# Patient Record
Sex: Female | Born: 1955 | Race: Black or African American | Hispanic: No | Smoking: Never smoker
Health system: Southern US, Community
[De-identification: ages and names within clinical notes are randomized; demographics above are authoritative.]

## PROBLEM LIST (undated history)

## (undated) DIAGNOSIS — I10 Essential (primary) hypertension: Secondary | ICD-10-CM

## (undated) DIAGNOSIS — R7303 Prediabetes: Secondary | ICD-10-CM

## (undated) DIAGNOSIS — E785 Hyperlipidemia, unspecified: Secondary | ICD-10-CM

## (undated) DIAGNOSIS — E7800 Pure hypercholesterolemia, unspecified: Secondary | ICD-10-CM

## (undated) DIAGNOSIS — R911 Solitary pulmonary nodule: Secondary | ICD-10-CM

## (undated) DIAGNOSIS — M1990 Unspecified osteoarthritis, unspecified site: Secondary | ICD-10-CM

## (undated) HISTORY — DX: Hyperlipidemia, unspecified: E78.5

## (undated) HISTORY — PX: COLONOSCOPY: SHX5424

## (undated) HISTORY — PX: OTHER SURGICAL HISTORY: SHX169

## (undated) HISTORY — PX: TUBAL LIGATION: SHX77

## (undated) HISTORY — DX: Essential (primary) hypertension: I10

## (undated) HISTORY — PX: POLYPECTOMY: SHX149

## (undated) HISTORY — DX: Solitary pulmonary nodule: R91.1

---

## 1997-07-13 ENCOUNTER — Other Ambulatory Visit: Admission: RE | Admit: 1997-07-13 | Discharge: 1997-07-13 | Payer: Self-pay | Admitting: Gynecology

## 1998-08-17 ENCOUNTER — Other Ambulatory Visit: Admission: RE | Admit: 1998-08-17 | Discharge: 1998-08-17 | Payer: Self-pay | Admitting: Gynecology

## 1999-10-26 ENCOUNTER — Emergency Department (HOSPITAL_COMMUNITY): Admission: EM | Admit: 1999-10-26 | Discharge: 1999-10-27 | Payer: Self-pay | Admitting: Emergency Medicine

## 1999-10-27 ENCOUNTER — Encounter: Payer: Self-pay | Admitting: Emergency Medicine

## 2000-04-23 ENCOUNTER — Other Ambulatory Visit: Admission: RE | Admit: 2000-04-23 | Discharge: 2000-04-23 | Payer: Self-pay | Admitting: Gynecology

## 2001-11-25 ENCOUNTER — Other Ambulatory Visit: Admission: RE | Admit: 2001-11-25 | Discharge: 2001-11-25 | Payer: Self-pay | Admitting: Gynecology

## 2003-03-01 ENCOUNTER — Emergency Department (HOSPITAL_COMMUNITY): Admission: EM | Admit: 2003-03-01 | Discharge: 2003-03-01 | Payer: Self-pay | Admitting: Emergency Medicine

## 2005-06-08 ENCOUNTER — Emergency Department (HOSPITAL_COMMUNITY): Admission: EM | Admit: 2005-06-08 | Discharge: 2005-06-08 | Payer: Self-pay | Admitting: Emergency Medicine

## 2005-06-27 ENCOUNTER — Ambulatory Visit: Payer: Self-pay | Admitting: Internal Medicine

## 2006-03-03 DIAGNOSIS — R911 Solitary pulmonary nodule: Secondary | ICD-10-CM

## 2006-03-03 HISTORY — DX: Solitary pulmonary nodule: R91.1

## 2006-03-25 ENCOUNTER — Emergency Department (HOSPITAL_COMMUNITY): Admission: EM | Admit: 2006-03-25 | Discharge: 2006-03-25 | Payer: Self-pay | Admitting: Emergency Medicine

## 2006-03-26 ENCOUNTER — Ambulatory Visit: Payer: Self-pay | Admitting: Internal Medicine

## 2006-03-26 LAB — CONVERTED CEMR LAB
ALT: 16 units/L (ref 0–40)
BUN: 10 mg/dL (ref 6–23)
Basophils Absolute: 0 10*3/uL (ref 0.0–0.1)
Basophils Relative: 0.5 % (ref 0.0–1.0)
Bilirubin Urine: NEGATIVE
Calcium: 9.2 mg/dL (ref 8.4–10.5)
GFR calc Af Amer: 115 mL/min
GFR calc non Af Amer: 95 mL/min
Glucose, Bld: 95 mg/dL (ref 70–99)
HCT: 37.1 % (ref 36.0–46.0)
MCHC: 34.5 g/dL (ref 30.0–36.0)
Monocytes Relative: 7.5 % (ref 3.0–11.0)
RBC: 4.74 M/uL (ref 3.87–5.11)
RDW: 14.5 % (ref 11.5–14.6)
Specific Gravity, Urine: >=1.03 (ref 1.000–1.03)
pH: 5 (ref 5.0–8.0)

## 2006-04-19 ENCOUNTER — Ambulatory Visit: Payer: Self-pay | Admitting: Internal Medicine

## 2006-05-14 ENCOUNTER — Encounter: Admission: RE | Admit: 2006-05-14 | Discharge: 2006-08-12 | Payer: Self-pay | Admitting: Internal Medicine

## 2006-05-31 ENCOUNTER — Ambulatory Visit: Payer: Self-pay | Admitting: Internal Medicine

## 2006-05-31 LAB — CONVERTED CEMR LAB
Creatinine,U: 145.2 mg/dL
Direct LDL: 150 mg/dL
HDL: 41.8 mg/dL (ref >39.0)
VLDL: 27 mg/dL (ref 0–40)

## 2006-06-18 ENCOUNTER — Ambulatory Visit: Payer: Self-pay | Admitting: Internal Medicine

## 2006-11-12 ENCOUNTER — Telehealth (INDEPENDENT_AMBULATORY_CARE_PROVIDER_SITE_OTHER): Payer: Self-pay | Admitting: *Deleted

## 2007-01-03 LAB — HM PAP SMEAR: HM Pap smear: NORMAL

## 2007-01-03 LAB — HM MAMMOGRAPHY: HM Mammogram: NORMAL

## 2007-04-03 DIAGNOSIS — E118 Type 2 diabetes mellitus with unspecified complications: Secondary | ICD-10-CM

## 2007-04-03 DIAGNOSIS — J984 Other disorders of lung: Secondary | ICD-10-CM

## 2007-04-03 DIAGNOSIS — Z9189 Other specified personal risk factors, not elsewhere classified: Secondary | ICD-10-CM | POA: Insufficient documentation

## 2007-04-03 DIAGNOSIS — R030 Elevated blood-pressure reading, without diagnosis of hypertension: Secondary | ICD-10-CM

## 2007-04-04 ENCOUNTER — Ambulatory Visit: Payer: Self-pay | Admitting: Internal Medicine

## 2007-04-04 DIAGNOSIS — E785 Hyperlipidemia, unspecified: Secondary | ICD-10-CM

## 2007-04-04 LAB — CONVERTED CEMR LAB
Bilirubin, Direct: 0.1 mg/dL (ref 0.0–0.3)
CO2: 25 meq/L (ref 19–32)
Calcium: 9.2 mg/dL (ref 8.4–10.5)
GFR calc Af Amer: 98 mL/min
GFR calc non Af Amer: 81 mL/min
HDL: 39.4 mg/dL (ref >39.0)
Hgb A1c MFr Bld: 6.1 % — ABNORMAL HIGH (ref 4.6–6.0)
Microalb Creat Ratio: 2.7 mg/g (ref 0.0–30.0)
Microalb, Ur: 0.4 mg/dL (ref 0.0–1.9)
Sodium: 138 meq/L (ref 135–145)
TSH: 1.98 microintl units/mL (ref 0.35–5.50)
Total Bilirubin: 0.5 mg/dL (ref 0.3–1.2)
Total CHOL/HDL Ratio: 4.6
VLDL: 14 mg/dL (ref 0–40)

## 2007-04-05 ENCOUNTER — Encounter: Payer: Self-pay | Admitting: Internal Medicine

## 2007-04-09 ENCOUNTER — Ambulatory Visit: Payer: Self-pay | Admitting: Cardiovascular Disease

## 2007-06-17 ENCOUNTER — Telehealth: Payer: Self-pay | Admitting: Internal Medicine

## 2007-06-25 ENCOUNTER — Telehealth: Payer: Self-pay | Admitting: Internal Medicine

## 2007-10-09 ENCOUNTER — Ambulatory Visit: Payer: Self-pay | Admitting: Internal Medicine

## 2007-10-09 DIAGNOSIS — R51 Headache: Secondary | ICD-10-CM

## 2007-10-09 DIAGNOSIS — K5900 Constipation, unspecified: Secondary | ICD-10-CM | POA: Insufficient documentation

## 2007-10-09 DIAGNOSIS — R519 Headache, unspecified: Secondary | ICD-10-CM | POA: Insufficient documentation

## 2007-10-09 DIAGNOSIS — IMO0001 Reserved for inherently not codable concepts without codable children: Secondary | ICD-10-CM

## 2007-10-09 LAB — CONVERTED CEMR LAB
ALT: 23 units/L (ref 0–35)
AST: 24 units/L (ref 0–37)
Albumin: 3.6 g/dL (ref 3.5–5.2)
BUN: 11 mg/dL (ref 6–23)
Basophils Absolute: 0 10*3/uL (ref 0.0–0.1)
Basophils Relative: 0.4 % (ref 0.0–3.0)
Bilirubin Urine: NEGATIVE
CO2: 27 meq/L (ref 19–32)
Calcium: 9.1 mg/dL (ref 8.4–10.5)
Cholesterol: 260 mg/dL (ref 0–200)
Creatinine, Ser: 0.8 mg/dL (ref 0.4–1.2)
Direct LDL: 181.8 mg/dL
Glucose, Bld: 102 mg/dL — ABNORMAL HIGH (ref 70–99)
HCT: 38.4 % (ref 36.0–46.0)
Hemoglobin, Urine: NEGATIVE
Hemoglobin: 12.6 g/dL (ref 12.0–15.0)
Hgb A1c MFr Bld: 6.6 % — ABNORMAL HIGH (ref 4.6–6.0)
Ketones, ur: NEGATIVE mg/dL
MCHC: 32.7 g/dL (ref 30.0–36.0)
Monocytes Absolute: 0.5 10*3/uL (ref 0.1–1.0)
Neutro Abs: 4.8 10*3/uL (ref 1.4–7.7)
Nitrite: NEGATIVE
RDW: 15.7 % — ABNORMAL HIGH (ref 11.5–14.6)
Total Bilirubin: 0.6 mg/dL (ref 0.3–1.2)
Total Protein, Urine: NEGATIVE mg/dL
Total Protein: 7.4 g/dL (ref 6.0–8.3)

## 2007-10-15 ENCOUNTER — Encounter: Payer: Self-pay | Admitting: Internal Medicine

## 2007-10-23 ENCOUNTER — Ambulatory Visit: Payer: Self-pay | Admitting: Internal Medicine

## 2007-10-23 DIAGNOSIS — I10 Essential (primary) hypertension: Secondary | ICD-10-CM | POA: Insufficient documentation

## 2007-10-25 ENCOUNTER — Telehealth: Payer: Self-pay | Admitting: Internal Medicine

## 2008-12-24 ENCOUNTER — Emergency Department (HOSPITAL_COMMUNITY): Admission: EM | Admit: 2008-12-24 | Discharge: 2008-12-24 | Payer: Self-pay | Admitting: Emergency Medicine

## 2010-04-04 LAB — URINALYSIS, ROUTINE W REFLEX MICROSCOPIC
Bilirubin Urine: NEGATIVE
Glucose, UA: NEGATIVE mg/dL
Hgb urine dipstick: NEGATIVE
Ketones, ur: 15 mg/dL — AB
Protein, ur: NEGATIVE mg/dL
pH: 6.5 (ref 5.0–8.0)
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
## 2010-10-06 LAB — HM DIABETES EYE EXAM

## 2010-11-07 ENCOUNTER — Ambulatory Visit: Payer: Self-pay | Admitting: Internal Medicine

## 2010-11-09 ENCOUNTER — Ambulatory Visit (INDEPENDENT_AMBULATORY_CARE_PROVIDER_SITE_OTHER)
Admission: RE | Admit: 2010-11-09 | Discharge: 2010-11-09 | Disposition: A | Discharge status: Home / Self Care | Payer: Managed Care, Other (non HMO) | Source: Ambulatory Visit | Attending: Internal Medicine | Admitting: Internal Medicine

## 2010-11-09 ENCOUNTER — Other Ambulatory Visit: Payer: Self-pay | Admitting: Internal Medicine

## 2010-11-09 ENCOUNTER — Encounter: Payer: Self-pay | Admitting: Internal Medicine

## 2010-11-09 ENCOUNTER — Ambulatory Visit (INDEPENDENT_AMBULATORY_CARE_PROVIDER_SITE_OTHER): Payer: Managed Care, Other (non HMO) | Admitting: Internal Medicine

## 2010-11-09 ENCOUNTER — Other Ambulatory Visit (INDEPENDENT_AMBULATORY_CARE_PROVIDER_SITE_OTHER): Payer: Managed Care, Other (non HMO)

## 2010-11-09 DIAGNOSIS — E119 Type 2 diabetes mellitus without complications: Secondary | ICD-10-CM

## 2010-11-09 DIAGNOSIS — N920 Excessive and frequent menstruation with regular cycle: Secondary | ICD-10-CM

## 2010-11-09 DIAGNOSIS — E785 Hyperlipidemia, unspecified: Secondary | ICD-10-CM

## 2010-11-09 DIAGNOSIS — I10 Essential (primary) hypertension: Secondary | ICD-10-CM

## 2010-11-09 DIAGNOSIS — Z1231 Encounter for screening mammogram for malignant neoplasm of breast: Secondary | ICD-10-CM

## 2010-11-09 DIAGNOSIS — N921 Excessive and frequent menstruation with irregular cycle: Secondary | ICD-10-CM | POA: Insufficient documentation

## 2010-11-09 DIAGNOSIS — Z0000 Encounter for general adult medical examination without abnormal findings: Secondary | ICD-10-CM

## 2010-11-09 DIAGNOSIS — J984 Other disorders of lung: Secondary | ICD-10-CM

## 2010-11-09 LAB — URINALYSIS, ROUTINE W REFLEX MICROSCOPIC
Leukocytes, UA: NEGATIVE
Nitrite: NEGATIVE
Specific Gravity, Urine: >=1.03 (ref 1.000–1.030)
Urobilinogen, UA: 0.2 (ref 0.0–1.0)
pH: 5.5 (ref 5.0–8.0)

## 2010-11-09 LAB — CBC WITH DIFFERENTIAL/PLATELET
Basophils Relative: 0.2 % (ref 0.0–3.0)
Eosinophils Absolute: 0.1 10*3/uL (ref 0.0–0.7)
Eosinophils Relative: 1.9 % (ref 0.0–5.0)
HCT: 37.5 % (ref 36.0–46.0)
Lymphs Abs: 1.7 10*3/uL (ref 0.7–4.0)
MCHC: 32.7 g/dL (ref 30.0–36.0)
MCV: 82.1 fl (ref 78.0–100.0)
Monocytes Absolute: 0.6 10*3/uL (ref 0.1–1.0)
Neutrophils Relative %: 61.2 % (ref 43.0–77.0)
Platelets: 181 10*3/uL (ref 150.0–400.0)
WBC: 6.2 10*3/uL (ref 4.5–10.5)

## 2010-11-09 LAB — COMPREHENSIVE METABOLIC PANEL
Alkaline Phosphatase: 48 U/L (ref 39–117)
CO2: 24 mEq/L (ref 19–32)
Creatinine, Ser: 0.6 mg/dL (ref 0.4–1.2)
GFR: 129.2 mL/min (ref >60.00)
Glucose, Bld: 88 mg/dL (ref 70–99)
Sodium: 138 mEq/L (ref 135–145)
Total Bilirubin: 0.7 mg/dL (ref 0.3–1.2)

## 2010-11-09 LAB — MICROALBUMIN / CREATININE URINE RATIO: Creatinine,U: 178.2 mg/dL

## 2010-11-09 LAB — LIPID PANEL
HDL: 49.9 mg/dL (ref >39.00)
Triglycerides: 86 mg/dL (ref 0.0–149.0)

## 2010-11-09 LAB — TSH: TSH: 2 u[IU]/mL (ref 0.35–5.50)

## 2010-11-09 NOTE — Assessment & Plan Note (Signed)
I will check her FLP today 

## 2010-11-09 NOTE — Assessment & Plan Note (Signed)
Her BP is well controlled, today I will check her labs to look for and organ damage

## 2010-11-09 NOTE — Patient Instructions (Signed)

## 2010-11-09 NOTE — Assessment & Plan Note (Signed)
She needs a f/up CXR

## 2010-11-09 NOTE — Assessment & Plan Note (Signed)
I will check her a1c today and will monitor her renal function 

## 2010-11-09 NOTE — Assessment & Plan Note (Signed)
I have asked her to see a GYN

## 2010-11-09 NOTE — Progress Notes (Signed)
Subjective:    Patient ID: [name], female    DOB: Apr 28, 1955, 55 y.o.   MRN: [id number]  Diabetes She presents for her follow-up diabetic visit. She has type 2 diabetes mellitus. Her disease course has been stable. There are no hypoglycemic associated symptoms. Pertinent negatives for hypoglycemia include no dizziness, headaches, pallor, seizures, speech difficulty or tremors. Pertinent negatives for diabetes include no blurred vision, no chest pain, no fatigue, no foot paresthesias, no foot ulcerations, no polydipsia, no polyphagia, no polyuria, no visual change, no weakness and no weight loss. There are no hypoglycemic complications. Symptoms are stable. There are no diabetic complications. When asked about current treatments, none were reported. She is compliant with treatment none of the time. Her weight is increasing steadily. She is following a generally healthy diet. Meal planning includes avoidance of concentrated sweets. She has not had a previous visit with a dietician. She never participates in exercise. There is no change in her home blood glucose trend. An ACE inhibitor/angiotensin II receptor blocker is not being taken. She does not see a podiatrist.Eye exam is current.      Review of Systems  Constitutional: Negative for chills, weight loss, diaphoresis, activity change, appetite change, fatigue and unexpected weight change.  HENT: Negative.   Eyes: Negative.  Negative for blurred vision.  Respiratory: Negative for apnea, cough, choking, chest tightness, shortness of breath, wheezing and stridor.   Cardiovascular: Negative for chest pain, palpitations and leg swelling.  Gastrointestinal: Negative for nausea, vomiting, abdominal pain, diarrhea, constipation and rectal pain.  Genitourinary: Positive for vaginal bleeding and menstrual problem (long, heavy periods). Negative for dysuria, urgency, polyuria, frequency, hematuria, flank pain, decreased urine volume, vaginal discharge,  enuresis, difficulty urinating, vaginal pain, pelvic pain and dyspareunia.  Musculoskeletal: Negative.  Negative for myalgias, back pain, joint swelling, arthralgias and gait problem.  Skin: Negative for color change, pallor, rash and wound.  Neurological: Negative for dizziness, tremors, seizures, syncope, facial asymmetry, speech difficulty, weakness, light-headedness, numbness and headaches.  Hematological: Negative for polydipsia, polyphagia and adenopathy. Does not bruise/bleed easily.  Psychiatric/Behavioral: Negative.        Objective:   Physical Exam  Vitals reviewed. Constitutional: She is oriented to person, place, and time. She appears well-developed and well-nourished. No distress.  HENT:  Head: Normocephalic and atraumatic.  Mouth/Throat: Oropharynx is clear and moist. No oropharyngeal exudate.  Eyes: Conjunctivae are normal. Right eye exhibits no discharge. Left eye exhibits no discharge. No scleral icterus.  Neck: Normal range of motion. Neck supple. No JVD present. No tracheal deviation present. No thyromegaly present.  Cardiovascular: Normal rate, regular rhythm, normal heart sounds and intact distal pulses.  Exam reveals no gallop and no friction rub.   No murmur heard. Pulmonary/Chest: Effort normal and breath sounds normal. No stridor. No respiratory distress. She has no wheezes. She has no rales. She exhibits no tenderness.  Abdominal: Soft. Bowel sounds are normal. She exhibits no distension and no mass. There is no tenderness. There is no rebound and no guarding.  Musculoskeletal: Normal range of motion. She exhibits no edema and no tenderness.  Lymphadenopathy:    She has no cervical adenopathy.  Neurological: She is oriented to person, place, and time. She displays normal reflexes. She exhibits normal muscle tone.  Skin: Skin is warm and dry. No rash noted. She is not diaphoretic. No erythema. No pallor.  Psychiatric: She has a normal mood and affect. Her behavior  is normal. Judgment and thought content normal.  Lab Results  Component Value Date   WBC 7.4 10/09/2007   HGB 12.6 10/09/2007   HCT 38.4 10/09/2007   PLT 230 10/09/2007   GLUCOSE 102* 10/09/2007   CHOL 260* 10/09/2007   TRIG 119 10/09/2007   HDL 39.9 10/09/2007   LDLDIRECT 181.8 10/09/2007   LDLCALC 130* 04/04/2007   ALT 23 10/09/2007   AST 24 10/09/2007   NA 142 10/09/2007   K 4.0 10/09/2007   CL 109 10/09/2007   CREATININE 0.8 10/09/2007   BUN 11 10/09/2007   CO2 27 10/09/2007   TSH 1.62 10/09/2007   HGBA1C 6.6* 10/09/2007   MICROALBUR 0.4 04/04/2007      Assessment & Plan:

## 2011-01-16 ENCOUNTER — Encounter: Payer: Self-pay | Admitting: Gastroenterology

## 2011-01-24 LAB — HM PAP SMEAR: HM Pap smear: NORMAL

## 2011-03-20 ENCOUNTER — Encounter: Payer: Self-pay | Admitting: Internal Medicine

## 2011-03-20 ENCOUNTER — Ambulatory Visit (INDEPENDENT_AMBULATORY_CARE_PROVIDER_SITE_OTHER): Payer: Managed Care, Other (non HMO) | Admitting: Internal Medicine

## 2011-03-20 ENCOUNTER — Other Ambulatory Visit (INDEPENDENT_AMBULATORY_CARE_PROVIDER_SITE_OTHER): Payer: Managed Care, Other (non HMO)

## 2011-03-20 VITALS — BP 134/78 | HR 70 | Temp 98.2°F | Resp 16 | Wt 222.0 lb

## 2011-03-20 DIAGNOSIS — Z136 Encounter for screening for cardiovascular disorders: Secondary | ICD-10-CM

## 2011-03-20 DIAGNOSIS — R5383 Other fatigue: Secondary | ICD-10-CM

## 2011-03-20 DIAGNOSIS — I10 Essential (primary) hypertension: Secondary | ICD-10-CM

## 2011-03-20 DIAGNOSIS — R5381 Other malaise: Secondary | ICD-10-CM

## 2011-03-20 DIAGNOSIS — E119 Type 2 diabetes mellitus without complications: Secondary | ICD-10-CM

## 2011-03-20 DIAGNOSIS — E785 Hyperlipidemia, unspecified: Secondary | ICD-10-CM

## 2011-03-20 LAB — COMPREHENSIVE METABOLIC PANEL
ALT: 15 U/L (ref 0–35)
AST: 16 U/L (ref 0–37)
Albumin: 3.6 g/dL (ref 3.5–5.2)
Alkaline Phosphatase: 42 U/L (ref 39–117)
Calcium: 8.9 mg/dL (ref 8.4–10.5)
Creatinine, Ser: 0.7 mg/dL (ref 0.4–1.2)
Sodium: 137 mEq/L (ref 135–145)

## 2011-03-20 LAB — LIPID PANEL
Cholesterol: 199 mg/dL (ref 0–200)
LDL Cholesterol: 140 mg/dL — ABNORMAL HIGH (ref 0–99)
Total CHOL/HDL Ratio: 5
VLDL: 15.6 mg/dL (ref 0.0–40.0)

## 2011-03-20 LAB — URINALYSIS, ROUTINE W REFLEX MICROSCOPIC
Bilirubin Urine: NEGATIVE
Hgb urine dipstick: NEGATIVE
Leukocytes, UA: NEGATIVE
Nitrite: NEGATIVE
Total Protein, Urine: NEGATIVE

## 2011-03-20 LAB — CBC WITH DIFFERENTIAL/PLATELET
Basophils Relative: 0.3 % (ref 0.0–3.0)
Eosinophils Absolute: 0.1 10*3/uL (ref 0.0–0.7)
Eosinophils Relative: 1.5 % (ref 0.0–5.0)
Hemoglobin: 11.9 g/dL — ABNORMAL LOW (ref 12.0–15.0)
Lymphocytes Relative: 27.2 % (ref 12.0–46.0)
MCHC: 32.8 g/dL (ref 30.0–36.0)
MCV: 81.2 fl (ref 78.0–100.0)
Neutro Abs: 4.6 10*3/uL (ref 1.4–7.7)
Neutrophils Relative %: 64.2 % (ref 43.0–77.0)
RBC: 4.48 Mil/uL (ref 3.87–5.11)
WBC: 7.2 10*3/uL (ref 4.5–10.5)

## 2011-03-20 LAB — HM DIABETES FOOT EXAM: HM Diabetic Foot Exam: NORMAL

## 2011-03-20 LAB — IBC PANEL: Transferrin: 280.6 mg/dL (ref 212.0–360.0)

## 2011-03-20 LAB — TSH: TSH: 2.34 u[IU]/mL (ref 0.35–5.50)

## 2011-03-20 MED ORDER — OLMESARTAN MEDOXOMIL 40 MG PO TABS: 40.0000 mg | ORAL_TABLET | Freq: Every day | ORAL | Status: DC

## 2011-03-20 MED ORDER — PITAVASTATIN CALCIUM 4 MG PO TABS: 1.0000 | ORAL_TABLET | Freq: Every day | ORAL | Status: DC

## 2011-03-20 NOTE — Assessment & Plan Note (Signed)
Her BP is well controlled, I will check her lytes and renal function today 

## 2011-03-20 NOTE — Patient Instructions (Signed)

## 2011-03-20 NOTE — Assessment & Plan Note (Signed)
She needs to start a statin so I asked her to start livalo

## 2011-03-20 NOTE — Assessment & Plan Note (Signed)
I will check her a1c today and will monitor her renal function 

## 2011-03-20 NOTE — Progress Notes (Signed)
Subjective:    Patient ID: [name], female    DOB: 27-Oct-1955, 56 y.o.   MRN: [id number]  Diabetes She presents for her follow-up diabetic visit. She has type 2 diabetes mellitus. Her disease course has been stable. Hypoglycemia symptoms include dizziness. Pertinent negatives for hypoglycemia include no headaches, pallor, seizures, speech difficulty or tremors. Associated symptoms include blurred vision and fatigue. Pertinent negatives for diabetes include no chest pain, no foot paresthesias, no foot ulcerations, no polydipsia, no polyphagia, no polyuria, no visual change, no weakness and no weight loss. There are no hypoglycemic complications. Symptoms are stable. There are no diabetic complications. When asked about current treatments, none were reported. Her weight is stable. She is following a generally unhealthy diet. When asked about meal planning, she reported none. She has not had a previous visit with a dietician. She participates in exercise intermittently. There is no change in her home blood glucose trend. Her breakfast blood glucose range is generally 90-110 mg/dl. Her lunch blood glucose range is generally 130-140 mg/dl. Her dinner blood glucose range is generally 130-140 mg/dl. Her highest blood glucose is 130-140 mg/dl. Her overall blood glucose range is 130-140 mg/dl. An ACE inhibitor/angiotensin II receptor blocker is not being taken. She does not see a podiatrist.Eye exam is current.      Review of Systems  Constitutional: Positive for fatigue. Negative for fever, chills, weight loss, diaphoresis, activity change, appetite change and unexpected weight change.  HENT: Negative.   Eyes: Positive for blurred vision.  Respiratory: Negative for apnea, cough, choking, chest tightness, shortness of breath, wheezing and stridor.   Cardiovascular: Negative for chest pain, palpitations and leg swelling.  Gastrointestinal: Negative for nausea, vomiting, abdominal pain, diarrhea,  constipation, blood in stool, abdominal distention, anal bleeding and rectal pain.  Genitourinary: Negative for dysuria, urgency, polyuria, frequency, hematuria, flank pain, decreased urine volume, vaginal bleeding, vaginal discharge, enuresis, difficulty urinating, genital sores, vaginal pain, menstrual problem, pelvic pain and dyspareunia.  Musculoskeletal: Negative for myalgias, back pain, joint swelling, arthralgias and gait problem.  Skin: Negative for color change, pallor, rash and wound.  Neurological: Positive for dizziness. Negative for tremors, seizures, syncope, facial asymmetry, speech difficulty, weakness, light-headedness, numbness and headaches.  Hematological: Negative for polydipsia, polyphagia and adenopathy. Does not bruise/bleed easily.  Psychiatric/Behavioral: Negative.        Objective:   Physical Exam  Vitals reviewed. Constitutional: She is oriented to person, place, and time. She appears well-developed and well-nourished. No distress.  HENT:  Head: Normocephalic and atraumatic.  Mouth/Throat: Oropharynx is clear and moist. No oropharyngeal exudate.  Eyes: Conjunctivae are normal. Right eye exhibits no discharge. Left eye exhibits no discharge. No scleral icterus.  Neck: Normal range of motion. Neck supple. No JVD present. No tracheal deviation present. No thyromegaly present.  Cardiovascular: Normal rate, regular rhythm, normal heart sounds and intact distal pulses.  Exam reveals no gallop.   No murmur heard. Pulmonary/Chest: Effort normal and breath sounds normal. No stridor. No respiratory distress. She has no wheezes. She has no rales. She exhibits no tenderness.  Abdominal: Soft. Bowel sounds are normal. She exhibits no distension and no mass. There is no tenderness. There is no rebound and no guarding.  Musculoskeletal: Normal range of motion. She exhibits no edema and no tenderness.  Lymphadenopathy:    She has no cervical adenopathy.  Neurological: She is  oriented to person, place, and time.  Skin: Skin is warm and dry. No rash noted. She is not diaphoretic. No erythema.  No pallor.  Psychiatric: She has a normal mood and affect. Her behavior is normal. Judgment and thought content normal.      Lab Results  Component Value Date   WBC 6.2 11/09/2010   HGB 12.3 11/09/2010   HCT 37.5 11/09/2010   PLT 181.0 11/09/2010   GLUCOSE 88 11/09/2010   CHOL 228* 11/09/2010   TRIG 86.0 11/09/2010   HDL 49.90 11/09/2010   LDLDIRECT 167.9 11/09/2010   LDLCALC 130* 04/04/2007   ALT 16 11/09/2010   AST 20 11/09/2010   NA 138 11/09/2010   K 3.8 11/09/2010   CL 107 11/09/2010   CREATININE 0.6 11/09/2010   BUN 16 11/09/2010   CO2 24 11/09/2010   TSH 2.00 11/09/2010   HGBA1C 6.1 11/09/2010   MICROALBUR 1.5 11/09/2010      Assessment & Plan:

## 2011-03-20 NOTE — Assessment & Plan Note (Signed)
Her EKG is normal today, I will check her labs to look for other causes of fatigue

## 2011-03-21 ENCOUNTER — Encounter: Payer: Self-pay | Admitting: Internal Medicine

## 2011-04-10 ENCOUNTER — Ambulatory Visit: Payer: Managed Care, Other (non HMO) | Admitting: Internal Medicine

## 2011-04-10 DIAGNOSIS — Z0289 Encounter for other administrative examinations: Secondary | ICD-10-CM

## 2012-01-03 HISTORY — PX: COLONOSCOPY: SHX174

## 2012-03-11 ENCOUNTER — Encounter: Payer: Self-pay | Admitting: Internal Medicine

## 2012-03-11 ENCOUNTER — Ambulatory Visit (INDEPENDENT_AMBULATORY_CARE_PROVIDER_SITE_OTHER): Payer: Managed Care, Other (non HMO) | Admitting: Internal Medicine

## 2012-03-11 ENCOUNTER — Other Ambulatory Visit (INDEPENDENT_AMBULATORY_CARE_PROVIDER_SITE_OTHER): Payer: Managed Care, Other (non HMO)

## 2012-03-11 VITALS — BP 140/82 | HR 84 | Temp 97.7°F | Resp 16 | Wt 217.2 lb

## 2012-03-11 DIAGNOSIS — E785 Hyperlipidemia, unspecified: Secondary | ICD-10-CM

## 2012-03-11 DIAGNOSIS — K219 Gastro-esophageal reflux disease without esophagitis: Secondary | ICD-10-CM

## 2012-03-11 DIAGNOSIS — R1314 Dysphagia, pharyngoesophageal phase: Secondary | ICD-10-CM

## 2012-03-11 DIAGNOSIS — J984 Other disorders of lung: Secondary | ICD-10-CM

## 2012-03-11 DIAGNOSIS — E119 Type 2 diabetes mellitus without complications: Secondary | ICD-10-CM

## 2012-03-11 DIAGNOSIS — I10 Essential (primary) hypertension: Secondary | ICD-10-CM

## 2012-03-11 DIAGNOSIS — Z1231 Encounter for screening mammogram for malignant neoplasm of breast: Secondary | ICD-10-CM

## 2012-03-11 LAB — CBC WITH DIFFERENTIAL/PLATELET
Basophils Absolute: 0 10*3/uL (ref 0.0–0.1)
HCT: 34.5 % — ABNORMAL LOW (ref 36.0–46.0)
Lymphs Abs: 2 10*3/uL (ref 0.7–4.0)
MCHC: 32 g/dL (ref 30.0–36.0)
MCV: 78.4 fl (ref 78.0–100.0)
Monocytes Absolute: 0.5 10*3/uL (ref 0.1–1.0)
Neutro Abs: 3.6 10*3/uL (ref 1.4–7.7)
Platelets: 179 10*3/uL (ref 150.0–400.0)
RDW: 15.9 % — ABNORMAL HIGH (ref 11.5–14.6)

## 2012-03-11 LAB — COMPREHENSIVE METABOLIC PANEL
ALT: 16 U/L (ref 0–35)
AST: 18 U/L (ref 0–37)
Alkaline Phosphatase: 41 U/L (ref 39–117)
CO2: 24 mEq/L (ref 19–32)
Sodium: 139 mEq/L (ref 135–145)
Total Bilirubin: 0.4 mg/dL (ref 0.3–1.2)
Total Protein: 7.1 g/dL (ref 6.0–8.3)

## 2012-03-11 LAB — LIPID PANEL
LDL Cholesterol: 135 mg/dL — ABNORMAL HIGH (ref 0–99)
Total CHOL/HDL Ratio: 5
VLDL: 21 mg/dL (ref 0.0–40.0)

## 2012-03-11 LAB — HEMOGLOBIN A1C: Hgb A1c MFr Bld: 6.1 % (ref 4.6–6.5)

## 2012-03-11 LAB — HM PAP SMEAR: HM Pap smear: NORMAL

## 2012-03-11 MED ORDER — RANITIDINE HCL 300 MG PO TABS: 300.0000 mg | ORAL_TABLET | Freq: Every day | ORAL | Status: DC

## 2012-03-11 NOTE — Patient Instructions (Signed)

## 2012-03-11 NOTE — Assessment & Plan Note (Signed)
I will recheck her FLP today 

## 2012-03-11 NOTE — Assessment & Plan Note (Signed)
She may benefit from an EGD ------> GI referral

## 2012-03-11 NOTE — Assessment & Plan Note (Signed)
Start zantac.  

## 2012-03-11 NOTE — Assessment & Plan Note (Signed)
She has adequate BP control 

## 2012-03-11 NOTE — Progress Notes (Signed)
Subjective:    Patient ID: [name], female    DOB: 1955/09/04, 57 y.o.   MRN: [id number]  Gastrophageal Reflux She complains of dysphagia, globus sensation and heartburn. She reports no abdominal pain, no belching, no chest pain, no choking, no coughing, no early satiety, no hoarse voice, no nausea, no sore throat, no stridor, no tooth decay, no water brash or no wheezing. This is a recurrent problem. The current episode started more than 1 month ago. The problem occurs occasionally. The problem has been unchanged. The heartburn duration is several minutes. The heartburn is located in the substernum. The heartburn is of mild intensity. The heartburn does not wake her from sleep. The heartburn does not limit her activity. The symptoms are aggravated by certain foods. Pertinent negatives include no anemia, fatigue, melena, muscle weakness, orthopnea or weight loss. Risk factors include lack of exercise. She has tried nothing for the symptoms. The treatment provided no relief.      Review of Systems  Constitutional: Negative for fever, chills, weight loss, diaphoresis, activity change, appetite change, fatigue and unexpected weight change.  HENT: Negative.  Negative for sore throat and hoarse voice.   Eyes: Negative.   Respiratory: Negative for cough, choking, chest tightness, shortness of breath, wheezing and stridor.   Cardiovascular: Negative for chest pain, palpitations and leg swelling.  Gastrointestinal: Positive for heartburn, dysphagia and constipation. Negative for nausea, vomiting, abdominal pain, diarrhea, blood in stool, melena, abdominal distention, anal bleeding and rectal pain.  Endocrine: Negative.   Genitourinary: Negative.   Musculoskeletal: Negative for myalgias, back pain, joint swelling, arthralgias, gait problem and muscle weakness.  Skin: Negative for color change, pallor, rash and wound.  Allergic/Immunologic: Negative.   Neurological: Negative for dizziness,  seizures, speech difficulty, weakness, light-headedness and headaches.  Hematological: Negative for adenopathy. Does not bruise/bleed easily.  Psychiatric/Behavioral: Negative.        Objective:   Physical Exam  Vitals reviewed. Constitutional: She is oriented to person, place, and time. She appears well-developed and well-nourished. No distress.  HENT:  Head: Normocephalic and atraumatic.  Mouth/Throat: Oropharynx is clear and moist. No oropharyngeal exudate.  Eyes: Conjunctivae are normal. Right eye exhibits no discharge. Left eye exhibits no discharge. No scleral icterus.  Neck: Normal range of motion. Neck supple. No JVD present. No tracheal deviation present. No thyromegaly present.  Cardiovascular: Normal rate, regular rhythm and intact distal pulses.  Exam reveals no gallop and no friction rub.   No murmur heard. Pulmonary/Chest: Effort normal and breath sounds normal. No stridor. No respiratory distress. She has no wheezes. She has no rales. She exhibits no tenderness.  Abdominal: Soft. Bowel sounds are normal. She exhibits no distension and no mass. There is no tenderness. There is no rebound and no guarding.  Musculoskeletal: Normal range of motion. She exhibits no edema and no tenderness.  Lymphadenopathy:    She has no cervical adenopathy.  Neurological: She is oriented to person, place, and time.  Skin: Skin is warm and dry. No rash noted. She is not diaphoretic. No erythema. No pallor.  Psychiatric: She has a normal mood and affect. Her behavior is normal. Judgment and thought content normal.     Lab Results  Component Value Date   WBC 7.2 03/20/2011   HGB 11.9* 03/20/2011   HCT 36.3 03/20/2011   PLT 168.0 03/20/2011   GLUCOSE 90 03/20/2011   CHOL 199 03/20/2011   TRIG 78.0 03/20/2011   HDL 43.30 03/20/2011   LDLDIRECT 167.9 11/09/2010  LDLCALC 140* 03/20/2011   ALT 15 03/20/2011   AST 16 03/20/2011   NA 137 03/20/2011   K 4.1 03/20/2011   CL 108 03/20/2011   CREATININE  0.7 03/20/2011   BUN 16 03/20/2011   CO2 21 03/20/2011   TSH 2.34 03/20/2011   HGBA1C 6.1 03/20/2011   MICROALBUR 1.5 11/09/2010       Assessment & Plan:

## 2012-03-11 NOTE — Assessment & Plan Note (Signed)
I will recheck her A1C today to see if she needs therapy

## 2012-03-12 ENCOUNTER — Encounter: Payer: Self-pay | Admitting: Internal Medicine

## 2012-03-14 ENCOUNTER — Encounter: Payer: Self-pay | Admitting: Internal Medicine

## 2012-03-18 ENCOUNTER — Ambulatory Visit (HOSPITAL_COMMUNITY): Payer: Managed Care, Other (non HMO)

## 2012-04-12 ENCOUNTER — Ambulatory Visit (HOSPITAL_COMMUNITY)
Admission: RE | Admit: 2012-04-12 | Discharge: 2012-04-12 | Disposition: A | Discharge status: Home / Self Care | Payer: Managed Care, Other (non HMO) | Source: Ambulatory Visit | Attending: Internal Medicine | Admitting: Internal Medicine

## 2012-04-12 ENCOUNTER — Ambulatory Visit (AMBULATORY_SURGERY_CENTER): Payer: Managed Care, Other (non HMO) | Admitting: *Deleted

## 2012-04-12 VITALS — Ht 67.0 in | Wt 211.6 lb

## 2012-04-12 DIAGNOSIS — Z1211 Encounter for screening for malignant neoplasm of colon: Secondary | ICD-10-CM

## 2012-04-12 DIAGNOSIS — Z1231 Encounter for screening mammogram for malignant neoplasm of breast: Secondary | ICD-10-CM | POA: Insufficient documentation

## 2012-04-12 MED ORDER — MOVIPREP 100 G PO SOLR: 1.0000 | Freq: Once | ORAL | Status: DC

## 2012-04-12 NOTE — Progress Notes (Signed)
NO EGG OR SOY ALLERGY. EWM NO PROBLEMS WITH SEDATION IN THE PAST. EWN

## 2012-04-15 LAB — HM MAMMOGRAPHY: HM Mammogram: NORMAL

## 2012-04-16 ENCOUNTER — Encounter: Payer: Self-pay | Admitting: Internal Medicine

## 2012-05-03 ENCOUNTER — Ambulatory Visit (AMBULATORY_SURGERY_CENTER): Payer: Managed Care, Other (non HMO) | Admitting: Internal Medicine

## 2012-05-03 ENCOUNTER — Encounter: Payer: Self-pay | Admitting: Internal Medicine

## 2012-05-03 VITALS — BP 135/76 | HR 64 | Temp 97.4°F | Resp 15 | Ht 67.0 in | Wt 211.0 lb

## 2012-05-03 DIAGNOSIS — D126 Benign neoplasm of colon, unspecified: Secondary | ICD-10-CM

## 2012-05-03 DIAGNOSIS — Z1211 Encounter for screening for malignant neoplasm of colon: Secondary | ICD-10-CM

## 2012-05-03 MED ORDER — SODIUM CHLORIDE 0.9 % IV SOLN: 500.0000 mL | INTRAVENOUS | Status: DC

## 2012-05-03 NOTE — Progress Notes (Signed)
Report to pacu rn, vss, bbs=clear 
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
## 2012-05-03 NOTE — Progress Notes (Signed)
Called to room to assist during endoscopic procedure.  Patient ID and intended procedure confirmed with present staff. Received instructions for my participation in the procedure from the performing physician.  

## 2012-05-03 NOTE — Op Note (Signed)
[facility] [address] [place], [address]   COLONOSCOPY PROCEDURE REPORT  PATIENT: [name], [name]  MR#: [id number] BIRTHDATE: [birth year] , 57  yrs. old GENDER: Female ENDOSCOPIST: Hart Carwin, MD REFERRED BY:  Etta Grandchild, M.D. PROCEDURE DATE:  05/03/2012 PROCEDURE:   Colonoscopy with snare polypectomy ASA CLASS:   Class I INDICATIONS:Average risk patient for colon cancer. MEDICATIONS: MAC sedation, administered by CRNA and Propofol (Diprivan) 340 mg IV  DESCRIPTION OF PROCEDURE:   After the risks and benefits and of the procedure were explained, informed consent was obtained.  A digital rectal exam revealed no abnormalities of the rectum.    The LB [id number]-[id number]  endoscope was introduced through the anus and advanced to the cecum, which was identified by both the appendix and ileocecal valve .  The quality of the prep was good, using MoviPrep .  The instrument was then slowly withdrawn as the colon was fully examined.     COLON FINDINGS: A polypoid shaped sessile polyp ranging between 5-36mm in size was found in the sigmoid colon.  A polypectomy was performed using snare cautery.  The resection was complete and the polyp tissue was completely retrieved.     Retroflexed views revealed no abnormalities.     The scope was then withdrawn from the patient and the procedure completed.  COMPLICATIONS: There were no complications. ENDOSCOPIC IMPRESSION: Sessile polyp ranging between 5-64mm in size was found in the sigmoid colonat 10 cm, polypectomy was performed using snare cautery Mild diverticulosis of the sigmoid colon  RECOMMENDATIONS: 1.  Await pathology results 2.  High fiber diet   REPEAT EXAM: for Colonoscopy, pending biopsy results.  cc:  _______________________________ eSignedHart Carwin, MD 05/03/2012 9:49 AM     PATIENT NAME:  [name], [name] MR#: [id number]

## 2012-05-03 NOTE — Patient Instructions (Addendum)
Impressions/recommendations:  Polyp (handout given) Diverticulosis (handout given) High Fiber Diet (handout given)  Repeat colonoscopy pending biopsy pathology results.  YOU HAD AN ENDOSCOPIC PROCEDURE TODAY AT THE [facility]: Refer to the procedure report that was given to you for any specific questions about what was found during the examination.  If the procedure report does not answer your questions, please call your gastroenterologist to clarify.  If you requested that your care partner not be given the details of your procedure findings, then the procedure report has been included in a sealed envelope for you to review at your convenience later.  YOU SHOULD EXPECT: Some feelings of bloating in the abdomen. Passage of more gas than usual.  Walking can help get rid of the air that was put into your GI tract during the procedure and reduce the bloating. If you had a lower endoscopy (such as a colonoscopy or flexible sigmoidoscopy) you may notice spotting of blood in your stool or on the toilet paper. If you underwent a bowel prep for your procedure, then you may not have a normal bowel movement for a few days.  DIET: Your first meal following the procedure should be a light meal and then it is ok to progress to your normal diet.  A half-sandwich or bowl of soup is an example of a good first meal.  Heavy or fried foods are harder to digest and may make you feel nauseous or bloated.  Likewise meals heavy in dairy and vegetables can cause extra gas to form and this can also increase the bloating.  Drink plenty of fluids but you should avoid alcoholic beverages for 24 hours.  ACTIVITY: Your care partner should take you home directly after the procedure.  You should plan to take it easy, moving slowly for the rest of the day.  You can resume normal activity the day after the procedure however you should NOT DRIVE or use heavy machinery for 24 hours (because of the sedation medicines used  during the test).    SYMPTOMS TO REPORT IMMEDIATELY: A gastroenterologist can be reached at any hour.  During normal business hours, 8:30 AM to 5:00 PM Monday through Friday, call [phone].  After hours and on weekends, please call the GI answering service at [phone] who will take a message and have the physician on call contact you.   Following lower endoscopy (colonoscopy or flexible sigmoidoscopy):  Excessive amounts of blood in the stool  Significant tenderness or worsening of abdominal pains  Swelling of the abdomen that is new, acute  Fever of 100F or higher   FOLLOW UP: If any biopsies were taken you will be contacted by phone or by letter within the next 1-3 weeks.  Call your gastroenterologist if you have not heard about the biopsies in 3 weeks.  Our staff will call the home number listed on your records the next business day following your procedure to check on you and address any questions or concerns that you may have at that time regarding the information given to you following your procedure. This is a courtesy call and so if there is no answer at the home number and we have not heard from you through the emergency physician on call, we will assume that you have returned to your regular daily activities without incident.  SIGNATURES/CONFIDENTIALITY: You and/or your care partner have signed paperwork which will be entered into your electronic medical record.  These signatures attest to the fact that that  the information above on your After Visit Summary has been reviewed and is understood.  Full responsibility of the confidentiality of this discharge information lies with you and/or your care-partner.

## 2012-05-06 ENCOUNTER — Telehealth: Payer: Self-pay | Admitting: *Deleted

## 2012-05-06 NOTE — Telephone Encounter (Signed)
  Follow up Call-  Call back number 05/03/2012  Post procedure Call Back phone  # [phone]  Permission to leave phone message Yes    No answer,left message.

## 2012-05-07 ENCOUNTER — Encounter: Payer: Self-pay | Admitting: Internal Medicine

## 2012-05-08 LAB — HM COLONOSCOPY

## 2012-08-03 ENCOUNTER — Emergency Department (INDEPENDENT_AMBULATORY_CARE_PROVIDER_SITE_OTHER)
Admission: EM | Admit: 2012-08-03 | Discharge: 2012-08-03 | Disposition: A | Discharge status: Home / Self Care | Payer: Managed Care, Other (non HMO) | Source: Home / Self Care

## 2012-08-03 ENCOUNTER — Encounter (HOSPITAL_COMMUNITY): Payer: Self-pay | Admitting: Emergency Medicine

## 2012-08-03 DIAGNOSIS — L259 Unspecified contact dermatitis, unspecified cause: Secondary | ICD-10-CM

## 2012-08-03 LAB — GLUCOSE, CAPILLARY: Glucose-Capillary: 83 mg/dL (ref 70–99)

## 2012-08-03 MED ORDER — CETIRIZINE HCL 10 MG PO TABS: 10.0000 mg | ORAL_TABLET | Freq: Every day | ORAL | Status: DC

## 2012-08-03 MED ORDER — MOMETASONE FUROATE 0.1 % EX SOLN: Freq: Every day | CUTANEOUS | Status: DC

## 2012-08-03 NOTE — ED Notes (Signed)
Call from pharmacy, asking for decision to use cream instead of solution

## 2012-08-03 NOTE — ED Notes (Signed)
Pt c/o rash on face onset 3 days.... sxs include: burning and itching... Denies: new hygiene product other than a soap she bought from the dollar store; denies: fevers... Tried hydrocortisone cream w/no help... Alert w/no signs of acute distress

## 2012-08-03 NOTE — ED Notes (Signed)
Decision to use cream per Clarnce Flock

## 2012-08-06 NOTE — ED Provider Notes (Signed)
CSN: [id number]     Arrival date & time 08/03/12  1146 History     First MD Initiated Contact with Patient 08/03/12 1425     Chief Complaint  Patient presents with  . Rash   (Consider location/radiation/quality/duration/timing/severity/associated sxs/prior Treatment) HPI   57 yo bf presents with 3 day hx of rash on her face.  States that she bought a new soap at the dollar store and pruritic, burning rash started shortly after.  Not affecting any other area.  Denies fever, chills, dyspnea, vision changes.     Past Medical History  Diagnosis Date  . Diabetes mellitus     NO MEDS FOR TREATMENT FOR THIS PER PT.  Marland Kitchen Hyperlipidemia   . Hypertension   . Lung nodule 03/2006    LLL 4-75mm   Past Surgical History  Procedure Laterality Date  . Tubal ligation    . Ovarian biopsy     Family History  Problem Relation Age of Onset  . Diabetes Brother   . Cancer Other     pancreatic cancer  . Colon cancer Neg Hx   . Rectal cancer Neg Hx   . Stomach cancer Neg Hx    History  Substance Use Topics  . Smoking status: Never Smoker   . Smokeless tobacco: Never Used  . Alcohol Use: No   OB History   Grav Para Term Preterm Abortions TAB SAB Ect Mult Living                 Review of Systems  Constitutional: Negative.   HENT: Negative.   Eyes: Negative.   Respiratory: Negative.   Cardiovascular: Negative.   Gastrointestinal: Negative.   Endocrine: Negative.   Genitourinary: Negative.   Musculoskeletal: Negative.   Skin: Positive for rash.  Allergic/Immunologic: Negative.   Neurological: Negative.   Hematological: Negative.   Psychiatric/Behavioral: Negative.     Allergies  Review of patient's allergies indicates no known allergies.  Home Medications   Current Outpatient Rx  Name  Route  Sig  Dispense  Refill  . Calcium Carbonate-Vitamin D (CALTRATE 600+D) 600-400 MG-UNIT per tablet   Oral   Take 1 tablet by mouth daily.           . Multiple Vitamin (MULTIVITAMIN)  tablet   Oral   Take 1 tablet by mouth daily.           . ranitidine (ZANTAC) 300 MG tablet   Oral   Take 1 tablet (300 mg total) by mouth at bedtime.   90 tablet   3   . cetirizine (ZYRTEC) 10 MG tablet   Oral   Take 1 tablet (10 mg total) by mouth daily.   20 tablet   0   . mometasone (ELOCON) 0.1 % lotion   Topical   Apply topically daily. For one week.  Apply to areas on face.  Avoid contact with eyes, nasal passages or mouth.   60 mL   0    BP 158/90  Pulse 73  Temp(Src) 98.9 F (37.2 C) (Oral)  Resp 16  SpO2 98%  LMP 07/18/2012 Physical Exam  Constitutional: She is oriented to person, place, and time. She appears well-developed and well-nourished.  HENT:  Head: Normocephalic and atraumatic.  Eyes: EOM are normal. Pupils are equal, round, and reactive to light.  Neck: Normal range of motion.  Cardiovascular: Normal rate.   Pulmonary/Chest: Effort normal and breath sounds normal.  Musculoskeletal: Normal range of motion.  Neurological: She is alert  and oriented to person, place, and time.  Skin: Rash (on face.  some redness..  no signs of infection. ) noted.  Psychiatric: She has a normal mood and affect.    ED Course   Procedures (including critical care time)  Labs Reviewed  GLUCOSE, CAPILLARY   No results found. 1. Contact dermatitis     MDM  Patient given scripts listed below.  If not better within the next few days, she should f/u with primary care.  Voices understanding.  If condition worsens she will go to ED or return here.     Meds ordered this encounter  Medications  . cetirizine (ZYRTEC) 10 MG tablet    Sig: Take 1 tablet (10 mg total) by mouth daily.    Dispense:  20 tablet    Refill:  0  . mometasone (ELOCON) 0.1 % lotion    Sig: Apply topically daily. For one week.  Apply to areas on face.  Avoid contact with eyes, nasal passages or mouth.    Dispense:  60 mL    Refill:  0    Zonia Kief, PA-C 08/06/12 0830

## 2012-08-06 NOTE — ED Provider Notes (Signed)
Medical screening examination/treatment/procedure(s) were performed by resident physician or non-physician practitioner and as supervising physician I was immediately available for consultation/collaboration.   Aleira Deiter DOUGLAS MD.   Malvin Morrish D Markeia Harkless, MD 08/06/12 1655 

## 2013-06-20 ENCOUNTER — Ambulatory Visit: Payer: Managed Care, Other (non HMO) | Admitting: Internal Medicine

## 2013-06-20 DIAGNOSIS — Z0289 Encounter for other administrative examinations: Secondary | ICD-10-CM

## 2013-07-09 ENCOUNTER — Encounter: Payer: Self-pay | Admitting: Internal Medicine

## 2013-07-09 ENCOUNTER — Other Ambulatory Visit (INDEPENDENT_AMBULATORY_CARE_PROVIDER_SITE_OTHER): Payer: Managed Care, Other (non HMO)

## 2013-07-09 ENCOUNTER — Ambulatory Visit (INDEPENDENT_AMBULATORY_CARE_PROVIDER_SITE_OTHER)
Admission: RE | Admit: 2013-07-09 | Discharge: 2013-07-09 | Disposition: A | Discharge status: Home / Self Care | Payer: Managed Care, Other (non HMO) | Source: Ambulatory Visit | Attending: Internal Medicine | Admitting: Internal Medicine

## 2013-07-09 ENCOUNTER — Ambulatory Visit (INDEPENDENT_AMBULATORY_CARE_PROVIDER_SITE_OTHER): Payer: Managed Care, Other (non HMO) | Admitting: Internal Medicine

## 2013-07-09 VITALS — BP 145/85 | HR 78 | Temp 97.1°F | Resp 16 | Wt 218.0 lb

## 2013-07-09 DIAGNOSIS — E785 Hyperlipidemia, unspecified: Secondary | ICD-10-CM

## 2013-07-09 DIAGNOSIS — E119 Type 2 diabetes mellitus without complications: Secondary | ICD-10-CM

## 2013-07-09 DIAGNOSIS — D539 Nutritional anemia, unspecified: Secondary | ICD-10-CM | POA: Insufficient documentation

## 2013-07-09 DIAGNOSIS — M25559 Pain in unspecified hip: Secondary | ICD-10-CM

## 2013-07-09 DIAGNOSIS — N951 Menopausal and female climacteric states: Secondary | ICD-10-CM

## 2013-07-09 DIAGNOSIS — M1610 Unilateral primary osteoarthritis, unspecified hip: Secondary | ICD-10-CM | POA: Insufficient documentation

## 2013-07-09 DIAGNOSIS — J984 Other disorders of lung: Secondary | ICD-10-CM

## 2013-07-09 DIAGNOSIS — I10 Essential (primary) hypertension: Secondary | ICD-10-CM

## 2013-07-09 DIAGNOSIS — D538 Other specified nutritional anemias: Secondary | ICD-10-CM

## 2013-07-09 DIAGNOSIS — M25551 Pain in right hip: Secondary | ICD-10-CM

## 2013-07-09 DIAGNOSIS — Z1231 Encounter for screening mammogram for malignant neoplasm of breast: Secondary | ICD-10-CM

## 2013-07-09 DIAGNOSIS — Z23 Encounter for immunization: Secondary | ICD-10-CM

## 2013-07-09 LAB — COMPREHENSIVE METABOLIC PANEL
ALT: 20 U/L (ref 0–35)
AST: 21 U/L (ref 0–37)
Albumin: 4 g/dL (ref 3.5–5.2)
Alkaline Phosphatase: 52 U/L (ref 39–117)
BILIRUBIN TOTAL: 0.4 mg/dL (ref 0.2–1.2)
BUN: 17 mg/dL (ref 6–23)
CALCIUM: 9.8 mg/dL (ref 8.4–10.5)
CHLORIDE: 103 meq/L (ref 96–112)
CO2: 24 meq/L (ref 19–32)
CREATININE: 0.8 mg/dL (ref 0.4–1.2)
GFR: 99.63 mL/min (ref >60.00)
GLUCOSE: 98 mg/dL (ref 70–99)
Potassium: 4.2 mEq/L (ref 3.5–5.1)
Sodium: 135 mEq/L (ref 135–145)
Total Protein: 8.1 g/dL (ref 6.0–8.3)

## 2013-07-09 LAB — URINALYSIS, ROUTINE W REFLEX MICROSCOPIC
Hgb urine dipstick: NEGATIVE
KETONES UR: NEGATIVE
Nitrite: NEGATIVE
Specific Gravity, Urine: 1.02 (ref 1.000–1.030)
Total Protein, Urine: NEGATIVE
URINE GLUCOSE: NEGATIVE
UROBILINOGEN UA: 1 (ref 0.0–1.0)
pH: 6.5 (ref 5.0–8.0)

## 2013-07-09 LAB — CBC WITH DIFFERENTIAL/PLATELET
Basophils Absolute: 0 10*3/uL (ref 0.0–0.1)
Basophils Relative: 0.3 % (ref 0.0–3.0)
EOS PCT: 1.6 % (ref 0.0–5.0)
Eosinophils Absolute: 0.1 10*3/uL (ref 0.0–0.7)
HEMATOCRIT: 38.1 % (ref 36.0–46.0)
HEMOGLOBIN: 12.5 g/dL (ref 12.0–15.0)
LYMPHS ABS: 2.3 10*3/uL (ref 0.7–4.0)
LYMPHS PCT: 29.3 % (ref 12.0–46.0)
MCHC: 32.8 g/dL (ref 30.0–36.0)
MCV: 80.3 fl (ref 78.0–100.0)
Monocytes Absolute: 0.5 10*3/uL (ref 0.1–1.0)
Monocytes Relative: 6.4 % (ref 3.0–12.0)
Neutro Abs: 5 10*3/uL (ref 1.4–7.7)
Neutrophils Relative %: 62.4 % (ref 43.0–77.0)
Platelets: 192 10*3/uL (ref 150.0–400.0)
RBC: 4.75 Mil/uL (ref 3.87–5.11)
RDW: 15.5 % (ref 11.5–15.5)
WBC: 8 10*3/uL (ref 4.0–10.5)

## 2013-07-09 LAB — FOLLICLE STIMULATING HORMONE: FSH: 50.1 m[IU]/mL

## 2013-07-09 LAB — VITAMIN B12: Vitamin B-12: 422 pg/mL (ref 211–911)

## 2013-07-09 LAB — LIPID PANEL
CHOLESTEROL: 260 mg/dL — AB (ref 0–200)
HDL: 46 mg/dL (ref >39.00)
LDL Cholesterol: 193 mg/dL — ABNORMAL HIGH (ref 0–99)
NonHDL: 214
Total CHOL/HDL Ratio: 6
Triglycerides: 106 mg/dL (ref 0.0–149.0)
VLDL: 21.2 mg/dL (ref 0.0–40.0)

## 2013-07-09 LAB — LUTEINIZING HORMONE: LH: 21.63 m[IU]/mL

## 2013-07-09 LAB — TSH: TSH: 1.81 u[IU]/mL (ref 0.35–4.50)

## 2013-07-09 LAB — IBC PANEL
IRON: 50 ug/dL (ref 42–145)
SATURATION RATIOS: 14.1 % — AB (ref 20.0–50.0)
TRANSFERRIN: 254 mg/dL (ref 212.0–360.0)

## 2013-07-09 LAB — FERRITIN: FERRITIN: 31 ng/mL (ref 10.0–291.0)

## 2013-07-09 LAB — HEMOGLOBIN A1C: HEMOGLOBIN A1C: 6.6 % — AB (ref 4.6–6.5)

## 2013-07-09 LAB — FOLATE: Folate: 9.8 ng/mL (ref >5.9)

## 2013-07-09 MED ORDER — ESTRADIOL 0.025 MG/24HR TD PTTW: 1.0000 | MEDICATED_PATCH | TRANSDERMAL | Status: DC

## 2013-07-09 NOTE — Progress Notes (Signed)
Pre visit review using our clinic review tool, if applicable. No additional management support is needed unless otherwise documented below in the visit note. 

## 2013-07-09 NOTE — Patient Instructions (Signed)

## 2013-07-09 NOTE — Progress Notes (Signed)
Subjective:    Patient ID: [name], female    DOB: 01-07-1955, 58 y.o.   MRN: [id number]  Arthritis Presents for follow-up visit. She complains of pain. She reports no stiffness, joint swelling or joint warmth. Affected locations include the right DIP. Her pain is at a severity of 3/10. Pertinent negatives include no diarrhea, dry eyes, dry mouth, dysuria, fatigue, fever, pain at night, pain while resting, rash, Raynaud's syndrome, uveitis or weight loss. Her past medical history is significant for osteoarthritis. Her pertinent risk factors include overuse. Past treatments include nothing. The treatment provided no relief. Factors aggravating her arthritis include activity.      Review of Systems  Constitutional: Positive for unexpected weight change (wt gain). Negative for fever, chills, weight loss, diaphoresis, appetite change and fatigue.       She has night sweats and hot flashes  HENT: Negative.   Eyes: Negative.   Respiratory: Negative.  Negative for cough, choking, chest tightness, shortness of breath and stridor.   Cardiovascular: Negative.  Negative for chest pain and leg swelling.  Gastrointestinal: Negative.  Negative for nausea, vomiting, abdominal pain, diarrhea, constipation and blood in stool.  Endocrine: Negative.  Negative for polydipsia, polyphagia and polyuria.  Genitourinary: Negative.  Negative for dysuria.  Musculoskeletal: Positive for arthralgias and arthritis. Negative for back pain, gait problem, joint swelling, myalgias, neck pain, neck stiffness and stiffness.  Skin: Negative.  Negative for rash.  Allergic/Immunologic: Negative.   Neurological: Negative.   Hematological: Negative.  Negative for adenopathy. Does not bruise/bleed easily.  Psychiatric/Behavioral: Negative.        Objective:   Physical Exam  Vitals reviewed. Constitutional: She is oriented to person, place, and time. She appears well-developed and well-nourished. No distress.  HENT:    Head: Normocephalic and atraumatic.  Mouth/Throat: Oropharynx is clear and moist. No oropharyngeal exudate.  Eyes: Conjunctivae are normal. Right eye exhibits no discharge. Left eye exhibits no discharge. No scleral icterus.  Neck: Normal range of motion. Neck supple. No JVD present. No tracheal deviation present. No thyromegaly present.  Cardiovascular: Normal rate, regular rhythm, normal heart sounds and intact distal pulses.  Exam reveals no gallop and no friction rub.   No murmur heard. Pulmonary/Chest: Effort normal and breath sounds normal. No stridor. No respiratory distress. She has no wheezes. She has no rales. She exhibits no tenderness.  Abdominal: Soft. Bowel sounds are normal. She exhibits no distension and no mass. There is no tenderness. There is no rebound and no guarding.  Musculoskeletal: Normal range of motion. She exhibits no edema.       Right hip: Normal. She exhibits normal range of motion, normal strength, no tenderness, no bony tenderness, no swelling, no crepitus, no deformity and no laceration.  Lymphadenopathy:    She has no cervical adenopathy.  Neurological: She is oriented to person, place, and time.  Skin: Skin is warm and dry. No rash noted. She is not diaphoretic. No erythema. No pallor.  Psychiatric: She has a normal mood and affect. Her behavior is normal. Judgment and thought content normal.     Lab Results  Component Value Date   WBC 6.3 03/11/2012   HGB 11.0* 03/11/2012   HCT 34.5* 03/11/2012   PLT 179.0 03/11/2012   GLUCOSE 81 03/11/2012   CHOL 194 03/11/2012   TRIG 105.0 03/11/2012   HDL 37.70* 03/11/2012   LDLDIRECT 167.9 11/09/2010   LDLCALC 135* 03/11/2012   ALT 16 03/11/2012   AST 18 03/11/2012  NA 139 03/11/2012   K 3.8 03/11/2012   CL 110 03/11/2012   CREATININE 0.6 03/11/2012   BUN 8 03/11/2012   CO2 24 03/11/2012   TSH 1.76 03/11/2012   HGBA1C 6.1 03/11/2012   MICROALBUR 1.5 11/09/2010       Assessment & Plan:

## 2013-07-10 ENCOUNTER — Encounter: Payer: Self-pay | Admitting: Internal Medicine

## 2013-07-10 ENCOUNTER — Telehealth: Payer: Self-pay | Admitting: Internal Medicine

## 2013-07-10 NOTE — Telephone Encounter (Signed)
Relevant patient education assigned to patient using Emmi. ° °

## 2013-07-11 ENCOUNTER — Encounter: Payer: Self-pay | Admitting: Internal Medicine

## 2013-07-11 NOTE — Assessment & Plan Note (Signed)
FSH/LH are elevated c/w menopause Will treat with an estrogen patch

## 2013-07-11 NOTE — Assessment & Plan Note (Signed)
She will work on her lifestyle modifications to treat this

## 2013-07-11 NOTE — Assessment & Plan Note (Signed)
Plain film shows DJD

## 2013-07-11 NOTE — Assessment & Plan Note (Signed)
CXR is normal now

## 2013-07-11 NOTE — Assessment & Plan Note (Signed)
I have asked her to start a statin for this

## 2013-07-11 NOTE — Assessment & Plan Note (Signed)
Her BP is adequately well controlled I will monitor her lytes and renal function today

## 2013-07-21 ENCOUNTER — Ambulatory Visit: Payer: Managed Care, Other (non HMO)

## 2013-08-07 ENCOUNTER — Ambulatory Visit (INDEPENDENT_AMBULATORY_CARE_PROVIDER_SITE_OTHER): Payer: Managed Care, Other (non HMO) | Admitting: Internal Medicine

## 2013-08-07 ENCOUNTER — Ambulatory Visit (INDEPENDENT_AMBULATORY_CARE_PROVIDER_SITE_OTHER)
Admission: RE | Admit: 2013-08-07 | Discharge: 2013-08-07 | Disposition: A | Discharge status: Home / Self Care | Payer: Managed Care, Other (non HMO) | Source: Ambulatory Visit | Attending: Internal Medicine | Admitting: Internal Medicine

## 2013-08-07 ENCOUNTER — Encounter: Payer: Self-pay | Admitting: Internal Medicine

## 2013-08-07 VITALS — BP 162/88 | HR 84 | Temp 99.0°F | Resp 16 | Ht 67.0 in | Wt 216.0 lb

## 2013-08-07 DIAGNOSIS — R05 Cough: Secondary | ICD-10-CM

## 2013-08-07 DIAGNOSIS — IMO0001 Reserved for inherently not codable concepts without codable children: Secondary | ICD-10-CM

## 2013-08-07 DIAGNOSIS — E785 Hyperlipidemia, unspecified: Secondary | ICD-10-CM

## 2013-08-07 DIAGNOSIS — J13 Pneumonia due to Streptococcus pneumoniae: Secondary | ICD-10-CM | POA: Insufficient documentation

## 2013-08-07 DIAGNOSIS — I10 Essential (primary) hypertension: Secondary | ICD-10-CM

## 2013-08-07 DIAGNOSIS — R059 Cough, unspecified: Secondary | ICD-10-CM

## 2013-08-07 DIAGNOSIS — E1165 Type 2 diabetes mellitus with hyperglycemia: Secondary | ICD-10-CM

## 2013-08-07 DIAGNOSIS — J984 Other disorders of lung: Secondary | ICD-10-CM

## 2013-08-07 MED ORDER — AZITHROMYCIN 500 MG PO TABS: 500.0000 mg | ORAL_TABLET | Freq: Every day | ORAL | Status: DC

## 2013-08-07 MED ORDER — AMLODIPINE-OLMESARTAN 10-40 MG PO TABS: 1.0000 | ORAL_TABLET | Freq: Every day | ORAL | Status: DC

## 2013-08-07 MED ORDER — PROMETHAZINE-DM 6.25-15 MG/5ML PO SYRP
5.0000 mL | ORAL_SOLUTION | Freq: Four times a day (QID) | ORAL | Status: DC | PRN
Start: 2013-08-07 — End: 2013-10-07

## 2013-08-07 MED ORDER — PITAVASTATIN CALCIUM 4 MG PO TABS: 1.0000 | ORAL_TABLET | Freq: Every day | ORAL | Status: DC

## 2013-08-07 NOTE — Progress Notes (Signed)
Subjective:    Patient ID: [name], female    DOB: 1955/04/27, 58 y.o.   MRN: [id number]  Cough This is a new problem. The current episode started in the past 7 days. The problem has been gradually worsening. The problem occurs every few hours. The cough is productive of purulent sputum. Associated symptoms include chills, a fever, a sore throat, shortness of breath and sweats. Pertinent negatives include no chest pain, ear congestion, ear pain, heartburn, hemoptysis, myalgias, nasal congestion, postnasal drip, rash, rhinorrhea, weight loss or wheezing. Nothing aggravates the symptoms. She has tried nothing for the symptoms. The treatment provided no relief. There is no history of asthma, bronchiectasis, bronchitis, COPD, emphysema, environmental allergies or pneumonia.      Review of Systems  Constitutional: Positive for fever and chills. Negative for weight loss, diaphoresis, activity change, appetite change, fatigue and unexpected weight change.  HENT: Positive for sore throat. Negative for ear pain, facial swelling, postnasal drip, rhinorrhea, sinus pressure, tinnitus, trouble swallowing and voice change.   Eyes: Negative.   Respiratory: Positive for cough and shortness of breath. Negative for hemoptysis, choking, wheezing and stridor.   Cardiovascular: Negative.  Negative for chest pain, palpitations and leg swelling.  Gastrointestinal: Negative.  Negative for heartburn, nausea, vomiting, abdominal pain, diarrhea, constipation and blood in stool.  Endocrine: Negative.   Genitourinary: Negative.   Musculoskeletal: Negative.  Negative for arthralgias, back pain and myalgias.  Skin: Negative.  Negative for rash.  Allergic/Immunologic: Negative.  Negative for environmental allergies.  Neurological: Negative.  Negative for dizziness, tremors, syncope and light-headedness.  Hematological: Negative.  Negative for adenopathy. Does not bruise/bleed easily.  Psychiatric/Behavioral:  Negative.        Objective:   Physical Exam  Vitals reviewed. Constitutional: She is oriented to person, place, and time. She appears well-developed and well-nourished.  Non-toxic appearance. She does not have a sickly appearance. She does not appear ill. No distress.  HENT:  Head: Normocephalic and atraumatic.  Mouth/Throat: Mucous membranes are normal. Mucous membranes are not pale, not dry and not cyanotic. No oral lesions. No trismus in the jaw. No uvula swelling. Posterior oropharyngeal erythema present. No oropharyngeal exudate, posterior oropharyngeal edema or tonsillar abscesses.  Eyes: Conjunctivae are normal. Right eye exhibits no discharge. Left eye exhibits no discharge. No scleral icterus.  Neck: Normal range of motion. Neck supple. No JVD present. No tracheal deviation present. No thyromegaly present.  Cardiovascular: Normal rate, regular rhythm, normal heart sounds and intact distal pulses.  Exam reveals no gallop and no friction rub.   No murmur heard. Pulmonary/Chest: Effort normal and breath sounds normal. No stridor. No respiratory distress. She has no wheezes. She has no rales. She exhibits no tenderness.  Abdominal: Soft. Bowel sounds are normal. She exhibits no distension and no mass. There is no tenderness. There is no rebound and no guarding.  Musculoskeletal: Normal range of motion. She exhibits no edema.  Lymphadenopathy:    She has no cervical adenopathy.  Neurological: She is oriented to person, place, and time.  Skin: Skin is warm and dry. No rash noted. She is not diaphoretic. No erythema. No pallor.     Lab Results  Component Value Date   WBC 8.0 07/09/2013   HGB 12.5 07/09/2013   HCT 38.1 07/09/2013   PLT 192.0 07/09/2013   GLUCOSE 98 07/09/2013   CHOL 260* 07/09/2013   TRIG 106.0 07/09/2013   HDL 46.00 07/09/2013   LDLDIRECT 167.9 11/09/2010   LDLCALC 193* 07/09/2013  ALT 20 07/09/2013   AST 21 07/09/2013   NA 135 07/09/2013   K 4.2 07/09/2013   CL 103 07/09/2013    CREATININE 0.8 07/09/2013   BUN 17 07/09/2013   CO2 24 07/09/2013   TSH 1.81 07/09/2013   HGBA1C 6.6* 07/09/2013   MICROALBUR 1.5 11/09/2010       Assessment & Plan:

## 2013-08-07 NOTE — Patient Instructions (Signed)
Cough, Adult  A cough is a reflex that helps clear your throat and airways. It can help heal the body or may be a reaction to an irritated airway. A cough may only last 2 or 3 weeks (acute) or may last more than 8 weeks (chronic).  CAUSES Acute cough:  Viral or bacterial infections. Chronic cough:  Infections.  Allergies.  Asthma.  Post-nasal drip.  Smoking.  Heartburn or acid reflux.  Some medicines.  Chronic lung problems (COPD).  Cancer. SYMPTOMS   Cough.  Fever.  Chest pain.  Increased breathing rate.  High-pitched whistling sound when breathing (wheezing).  Colored mucus that you cough up (sputum). TREATMENT   A bacterial cough may be treated with antibiotic medicine.  A viral cough must run its course and will not respond to antibiotics.  Your caregiver may recommend other treatments if you have a chronic cough. HOME CARE INSTRUCTIONS   Only take over-the-counter or prescription medicines for pain, discomfort, or fever as directed by your caregiver. Use cough suppressants only as directed by your caregiver.  Use a cold steam vaporizer or humidifier in your bedroom or home to help loosen secretions.  Sleep in a semi-upright position if your cough is worse at night.  Rest as needed.  Stop smoking if you smoke. SEEK IMMEDIATE MEDICAL CARE IF:   You have pus in your sputum.  Your cough starts to worsen.  You cannot control your cough with suppressants and are losing sleep.  You begin coughing up blood.  You have difficulty breathing.  You develop pain which is getting worse or is uncontrolled with medicine.  You have a fever. MAKE SURE YOU:   Understand these instructions.  Will watch your condition.  Will get help right away if you are not doing well or get worse. Document Released: 06/17/2010 Document Revised: 03/13/2011 Document Reviewed: 06/17/2010 Exit[employer] Patient Information 2015 [employer]. This information is not intended  to replace advice given to you by your health care provider. Make sure you discuss any questions you have with your health care provider.  

## 2013-08-07 NOTE — Progress Notes (Signed)
Pre visit review using our clinic review tool, if applicable. No additional management support is needed unless otherwise documented below in the visit note. 

## 2013-08-08 ENCOUNTER — Encounter: Payer: Self-pay | Admitting: Internal Medicine

## 2013-08-08 ENCOUNTER — Telehealth: Payer: Self-pay | Admitting: Internal Medicine

## 2013-08-08 DIAGNOSIS — E1165 Type 2 diabetes mellitus with hyperglycemia: Secondary | ICD-10-CM

## 2013-08-08 DIAGNOSIS — I10 Essential (primary) hypertension: Secondary | ICD-10-CM

## 2013-08-08 DIAGNOSIS — E785 Hyperlipidemia, unspecified: Secondary | ICD-10-CM

## 2013-08-08 DIAGNOSIS — IMO0001 Reserved for inherently not codable concepts without codable children: Secondary | ICD-10-CM

## 2013-08-08 MED ORDER — LOSARTAN POTASSIUM 100 MG PO TABS
100.0000 mg | ORAL_TABLET | Freq: Every day | ORAL | Status: DC
Start: 2013-08-08 — End: 2016-01-10

## 2013-08-08 MED ORDER — ATORVASTATIN CALCIUM 40 MG PO TABS: 40.0000 mg | ORAL_TABLET | Freq: Every day | ORAL | Status: DC

## 2013-08-08 MED ORDER — AMLODIPINE BESYLATE 10 MG PO TABS: 10.0000 mg | ORAL_TABLET | Freq: Every day | ORAL | Status: DC

## 2013-08-08 NOTE — Assessment & Plan Note (Signed)
She does not need to start a med I have asked her to improve on her lifestyle modifications

## 2013-08-08 NOTE — Assessment & Plan Note (Signed)
Will treat the infection with zithromax and will control the cough with phenergan-dm 

## 2013-08-08 NOTE — Assessment & Plan Note (Signed)
She needs to start a statin for risk reduction

## 2013-08-08 NOTE — Telephone Encounter (Signed)
Pt called stated that Azor and Livalo is too exsprensive for her, so pharmacy ([facility] [address]) told her that these med will be cheaper for pt : Azor-amlodpine or losartan and Livalo-simvastatin. Please advise

## 2013-08-08 NOTE — Assessment & Plan Note (Signed)
Her CXR is normal.

## 2013-08-08 NOTE — Assessment & Plan Note (Signed)
Her BP is not well controlled I have asked her to restart the ARB/CCB combo

## 2013-10-07 ENCOUNTER — Encounter: Payer: Self-pay | Admitting: Internal Medicine

## 2013-10-07 ENCOUNTER — Ambulatory Visit (INDEPENDENT_AMBULATORY_CARE_PROVIDER_SITE_OTHER): Payer: Managed Care, Other (non HMO) | Admitting: Internal Medicine

## 2013-10-07 ENCOUNTER — Ambulatory Visit (INDEPENDENT_AMBULATORY_CARE_PROVIDER_SITE_OTHER)
Admission: RE | Admit: 2013-10-07 | Discharge: 2013-10-07 | Disposition: A | Discharge status: Home / Self Care | Payer: Managed Care, Other (non HMO) | Source: Ambulatory Visit | Attending: Internal Medicine | Admitting: Internal Medicine

## 2013-10-07 VITALS — BP 136/80 | HR 84 | Temp 98.3°F | Resp 16 | Ht 67.0 in | Wt 214.0 lb

## 2013-10-07 DIAGNOSIS — I10 Essential (primary) hypertension: Secondary | ICD-10-CM

## 2013-10-07 DIAGNOSIS — M5416 Radiculopathy, lumbar region: Secondary | ICD-10-CM

## 2013-10-07 DIAGNOSIS — M1610 Unilateral primary osteoarthritis, unspecified hip: Secondary | ICD-10-CM

## 2013-10-07 DIAGNOSIS — M5417 Radiculopathy, lumbosacral region: Secondary | ICD-10-CM

## 2013-10-07 DIAGNOSIS — M169 Osteoarthritis of hip, unspecified: Secondary | ICD-10-CM

## 2013-10-07 DIAGNOSIS — E669 Obesity, unspecified: Secondary | ICD-10-CM | POA: Insufficient documentation

## 2013-10-07 MED ORDER — DICLOFENAC 35 MG PO CAPS: 1.0000 | ORAL_CAPSULE | Freq: Three times a day (TID) | ORAL | Status: DC | PRN

## 2013-10-07 MED ORDER — PHENTERMINE HCL 37.5 MG PO CAPS: 37.5000 mg | ORAL_CAPSULE | ORAL | Status: DC

## 2013-10-07 NOTE — Assessment & Plan Note (Signed)
Will start nsaids for the pain

## 2013-10-07 NOTE — Assessment & Plan Note (Signed)
Her plain film shows some arthritis, her exam is normal Will start nsaids and follow over the next few weeks

## 2013-10-07 NOTE — Patient Instructions (Signed)
Back Pain, Adult Low back pain is very common. About 1 in 5 people have back pain.The cause of low back pain is rarely dangerous. The pain often gets better over time.About half of people with a sudden onset of back pain feel better in just 2 weeks. About 8 in 10 people feel better by 6 weeks.  CAUSES Some common causes of back pain include:  Strain of the muscles or ligaments supporting the spine.  Wear and tear (degeneration) of the spinal discs.  Arthritis.  Direct injury to the back. DIAGNOSIS Most of the time, the direct cause of low back pain is not known.However, back pain can be treated effectively even when the exact cause of the pain is unknown.Answering your caregiver's questions about your overall health and symptoms is one of the most accurate ways to make sure the cause of your pain is not dangerous. If your caregiver needs more information, he or she may order lab work or imaging tests (X-rays or MRIs).However, even if imaging tests show changes in your back, this usually does not require surgery. HOME CARE INSTRUCTIONS For many people, back pain returns.Since low back pain is rarely dangerous, it is often a condition that people can learn to manageon their own.   Remain active. It is stressful on the back to sit or stand in one place. Do not sit, drive, or stand in one place for more than 30 minutes at a time. Take short walks on level surfaces as soon as pain allows.Try to increase the length of time you walk each day.  Do not stay in bed.Resting more than 1 or 2 days can delay your recovery.  Do not avoid exercise or work.Your body is made to move.It is not dangerous to be active, even though your back may hurt.Your back will likely heal faster if you return to being active before your pain is gone.  Pay attention to your body when you bend and lift. Many people have less discomfortwhen lifting if they bend their knees, keep the load close to their bodies,and  avoid twisting. Often, the most comfortable positions are those that put less stress on your recovering back.  Find a comfortable position to sleep. Use a firm mattress and lie on your side with your knees slightly bent. If you lie on your back, put a pillow under your knees.  Only take over-the-counter or prescription medicines as directed by your caregiver. Over-the-counter medicines to reduce pain and inflammation are often the most helpful.Your caregiver may prescribe muscle relaxant drugs.These medicines help dull your pain so you can more quickly return to your normal activities and healthy exercise.  Put ice on the injured area.  Put ice in a plastic bag.  Place a towel between your skin and the bag.  Leave the ice on for 15-20 minutes, 03-04 times a day for the first 2 to 3 days. After that, ice and heat may be alternated to reduce pain and spasms.  Ask your caregiver about trying back exercises and gentle massage. This may be of some benefit.  Avoid feeling anxious or stressed.Stress increases muscle tension and can worsen back pain.It is important to recognize when you are anxious or stressed and learn ways to manage it.Exercise is a great option. SEEK MEDICAL CARE IF:  You have pain that is not relieved with rest or medicine.  You have pain that does not improve in 1 week.  You have new symptoms.  You are generally not feeling well. SEEK   IMMEDIATE MEDICAL CARE IF:   You have pain that radiates from your back into your legs.  You develop new bowel or bladder control problems.  You have unusual weakness or numbness in your arms or legs.  You develop nausea or vomiting.  You develop abdominal pain.  You feel faint. Document Released: 12/19/2004 Document Revised: 06/20/2011 Document Reviewed: 04/22/2013 [employer] Patient Information 2015 [employer]. This information is not intended to replace advice given to you by your health care provider. Make sure you  discuss any questions you have with your health care provider.  

## 2013-10-07 NOTE — Progress Notes (Signed)
Subjective:    Patient ID: [name], female    DOB: 06-11-1955, 58 y.o.   MRN: [id number]  Back Pain This is a recurrent problem. The current episode started more than 1 month ago. The problem occurs intermittently. The problem is unchanged. The pain is present in the lumbar spine. The quality of the pain is described as aching. The pain radiates to the right thigh. The pain is at a severity of 3/10. The pain is mild. The pain is worse during the day. The symptoms are aggravated by sitting and position. Pertinent negatives include no abdominal pain, bladder incontinence, bowel incontinence, chest pain, dysuria, fever, headaches, leg pain, numbness, paresis, paresthesias, pelvic pain, perianal numbness, tingling, weakness or weight loss. Risk factors include obesity and lack of exercise. She has tried nothing for the symptoms. The treatment provided no relief.      Review of Systems  Constitutional: Negative.  Negative for fever, chills, weight loss, diaphoresis, activity change, appetite change, fatigue and unexpected weight change.  HENT: Negative.   Eyes: Negative.   Respiratory: Negative.  Negative for cough, choking, chest tightness, shortness of breath, wheezing and stridor.   Cardiovascular: Negative.  Negative for chest pain, palpitations and leg swelling.  Gastrointestinal: Negative.  Negative for nausea, vomiting, abdominal pain, diarrhea, constipation, blood in stool and bowel incontinence.  Endocrine: Negative.   Genitourinary: Negative.  Negative for bladder incontinence, dysuria, frequency, hematuria, flank pain and pelvic pain.  Musculoskeletal: Positive for arthralgias (rt hip) and back pain. Negative for gait problem, joint swelling, myalgias, neck pain and neck stiffness.  Skin: Negative.  Negative for rash.  Allergic/Immunologic: Negative.   Neurological: Negative.  Negative for dizziness, tingling, weakness, numbness, headaches and paresthesias.  Hematological:  Negative.  Negative for adenopathy. Does not bruise/bleed easily.  Psychiatric/Behavioral: Negative.        Objective:   Physical Exam  Constitutional: She is oriented to person, place, and time. She appears well-developed and well-nourished. No distress.  HENT:  Head: Normocephalic and atraumatic.  Mouth/Throat: Oropharynx is clear and moist. No oropharyngeal exudate.  Eyes: Conjunctivae are normal. Right eye exhibits no discharge. Left eye exhibits no discharge. No scleral icterus.  Neck: Normal range of motion. Neck supple. No JVD present. No tracheal deviation present. No thyromegaly present.  Cardiovascular: Normal rate, regular rhythm, normal heart sounds and intact distal pulses.  Exam reveals no gallop and no friction rub.   No murmur heard. Pulmonary/Chest: Effort normal and breath sounds normal. No stridor. No respiratory distress. She has no wheezes. She has no rales. She exhibits no tenderness.  Abdominal: Soft. Bowel sounds are normal. She exhibits no distension and no mass. There is no tenderness. There is no rebound and no guarding.  Musculoskeletal: Normal range of motion. She exhibits no edema and no tenderness.       Right hip: Normal. She exhibits normal range of motion, normal strength, no tenderness, no bony tenderness, no swelling, no crepitus, no deformity and no laceration.       Lumbar back: Normal. She exhibits normal range of motion, no tenderness, no bony tenderness, no swelling, no edema, no deformity, no laceration, no pain, no spasm and normal pulse.  Lymphadenopathy:    She has no cervical adenopathy.  Neurological: She is alert and oriented to person, place, and time. She has normal strength. She displays no atrophy, no tremor and normal reflexes. No cranial nerve deficit or sensory deficit. She exhibits normal muscle tone. She displays a negative Romberg  sign. She displays no seizure activity. Coordination and gait normal.  Reflex Scores:      Tricep reflexes  are 1+ on the right side and 1+ on the left side.      Bicep reflexes are 1+ on the right side and 1+ on the left side.      Brachioradialis reflexes are 1+ on the right side and 1+ on the left side.      Patellar reflexes are 1+ on the right side and 1+ on the left side.      Achilles reflexes are 1+ on the right side and 1+ on the left side. Neg SLR in BLE  Skin: Skin is warm and dry. No rash noted. She is not diaphoretic. No erythema. No pallor.          Assessment & Plan:

## 2013-10-07 NOTE — Progress Notes (Signed)
Pre visit review using our clinic review tool, if applicable. No additional management support is needed unless otherwise documented below in the visit note. 

## 2013-10-07 NOTE — Assessment & Plan Note (Signed)
She will try phentermine for weight loss

## 2013-10-07 NOTE — Assessment & Plan Note (Signed)
Her BP is well controlled 

## 2014-02-26 ENCOUNTER — Telehealth: Payer: Self-pay | Admitting: Internal Medicine

## 2014-02-26 DIAGNOSIS — I10 Essential (primary) hypertension: Secondary | ICD-10-CM

## 2014-02-26 MED ORDER — AMLODIPINE BESYLATE 10 MG PO TABS: 10.0000 mg | ORAL_TABLET | Freq: Every day | ORAL | Status: DC

## 2014-02-26 NOTE — Telephone Encounter (Signed)
rx sent in electronically 

## 2014-02-26 NOTE — Telephone Encounter (Signed)
Patient reqeusting refill of amLODipine (NORVASC) 10 MG tablet [[id number]] to [facility] [address]

## 2014-05-07 ENCOUNTER — Other Ambulatory Visit: Payer: Self-pay | Admitting: Internal Medicine

## 2014-05-07 ENCOUNTER — Other Ambulatory Visit (INDEPENDENT_AMBULATORY_CARE_PROVIDER_SITE_OTHER): Payer: Managed Care, Other (non HMO)

## 2014-05-07 ENCOUNTER — Encounter: Payer: Self-pay | Admitting: Internal Medicine

## 2014-05-07 ENCOUNTER — Ambulatory Visit (INDEPENDENT_AMBULATORY_CARE_PROVIDER_SITE_OTHER): Payer: Managed Care, Other (non HMO) | Admitting: Internal Medicine

## 2014-05-07 VITALS — BP 128/80 | HR 79 | Temp 98.2°F | Resp 18 | Ht 67.0 in | Wt 214.0 lb

## 2014-05-07 DIAGNOSIS — M169 Osteoarthritis of hip, unspecified: Secondary | ICD-10-CM | POA: Diagnosis not present

## 2014-05-07 DIAGNOSIS — M1610 Unilateral primary osteoarthritis, unspecified hip: Secondary | ICD-10-CM

## 2014-05-07 DIAGNOSIS — I10 Essential (primary) hypertension: Secondary | ICD-10-CM | POA: Diagnosis not present

## 2014-05-07 DIAGNOSIS — Z1231 Encounter for screening mammogram for malignant neoplasm of breast: Secondary | ICD-10-CM

## 2014-05-07 DIAGNOSIS — E118 Type 2 diabetes mellitus with unspecified complications: Secondary | ICD-10-CM | POA: Diagnosis not present

## 2014-05-07 DIAGNOSIS — E785 Hyperlipidemia, unspecified: Secondary | ICD-10-CM | POA: Diagnosis not present

## 2014-05-07 LAB — URINALYSIS, ROUTINE W REFLEX MICROSCOPIC
Bilirubin Urine: NEGATIVE
Hgb urine dipstick: NEGATIVE
Ketones, ur: NEGATIVE
LEUKOCYTES UA: NEGATIVE
Nitrite: NEGATIVE
RBC / HPF: NONE SEEN (ref 0–?)
Specific Gravity, Urine: >=1.03 — AB (ref 1.000–1.030)
Total Protein, Urine: NEGATIVE
Urine Glucose: NEGATIVE
Urobilinogen, UA: 1 (ref 0.0–1.0)
pH: 5.5 (ref 5.0–8.0)

## 2014-05-07 LAB — CBC WITH DIFFERENTIAL/PLATELET
Basophils Absolute: 0 10*3/uL (ref 0.0–0.1)
Basophils Relative: 0.4 % (ref 0.0–3.0)
Eosinophils Absolute: 0.1 10*3/uL (ref 0.0–0.7)
Eosinophils Relative: 1.6 % (ref 0.0–5.0)
HEMATOCRIT: 37 % (ref 36.0–46.0)
Hemoglobin: 12.5 g/dL (ref 12.0–15.0)
LYMPHS ABS: 2 10*3/uL (ref 0.7–4.0)
Lymphocytes Relative: 25.3 % (ref 12.0–46.0)
MCHC: 33.9 g/dL (ref 30.0–36.0)
MCV: 77.4 fl — ABNORMAL LOW (ref 78.0–100.0)
MONO ABS: 0.6 10*3/uL (ref 0.1–1.0)
Monocytes Relative: 7.3 % (ref 3.0–12.0)
Neutro Abs: 5.1 10*3/uL (ref 1.4–7.7)
Neutrophils Relative %: 65.4 % (ref 43.0–77.0)
PLATELETS: 198 10*3/uL (ref 150.0–400.0)
RBC: 4.78 Mil/uL (ref 3.87–5.11)
RDW: 15.9 % — AB (ref 11.5–15.5)
WBC: 7.8 10*3/uL (ref 4.0–10.5)

## 2014-05-07 LAB — MICROALBUMIN / CREATININE URINE RATIO
Creatinine,U: 286.5 mg/dL
MICROALB UR: 2.8 mg/dL — AB (ref 0.0–1.9)
Microalb Creat Ratio: 1 mg/g (ref 0.0–30.0)

## 2014-05-07 LAB — COMPREHENSIVE METABOLIC PANEL
ALBUMIN: 4 g/dL (ref 3.5–5.2)
ALK PHOS: 62 U/L (ref 39–117)
ALT: 17 U/L (ref 0–35)
AST: 19 U/L (ref 0–37)
BUN: 17 mg/dL (ref 6–23)
CO2: 27 meq/L (ref 19–32)
Calcium: 10 mg/dL (ref 8.4–10.5)
Chloride: 107 mEq/L (ref 96–112)
Creatinine, Ser: 0.7 mg/dL (ref 0.40–1.20)
GFR: 110.89 mL/min (ref >60.00)
Glucose, Bld: 89 mg/dL (ref 70–99)
POTASSIUM: 3.8 meq/L (ref 3.5–5.1)
Sodium: 140 mEq/L (ref 135–145)
TOTAL PROTEIN: 7.8 g/dL (ref 6.0–8.3)
Total Bilirubin: 0.3 mg/dL (ref 0.2–1.2)

## 2014-05-07 LAB — LIPID PANEL
Cholesterol: 211 mg/dL — ABNORMAL HIGH (ref 0–200)
HDL: 44.7 mg/dL (ref >39.00)
LDL Cholesterol: 139 mg/dL — ABNORMAL HIGH (ref 0–99)
NonHDL: 166.3
TRIGLYCERIDES: 137 mg/dL (ref 0.0–149.0)
Total CHOL/HDL Ratio: 5
VLDL: 27.4 mg/dL (ref 0.0–40.0)

## 2014-05-07 LAB — TSH: TSH: 1.41 u[IU]/mL (ref 0.35–4.50)

## 2014-05-07 LAB — HEMOGLOBIN A1C: Hgb A1c MFr Bld: 6.4 % (ref 4.6–6.5)

## 2014-05-07 MED ORDER — INDOMETHACIN 40 MG PO CAPS: 1.0000 | ORAL_CAPSULE | Freq: Two times a day (BID) | ORAL | Status: DC

## 2014-05-07 NOTE — Patient Instructions (Signed)

## 2014-05-07 NOTE — Progress Notes (Signed)
   Subjective:    Patient ID: [name], female    DOB: 04/04/1955, 59 y.o.   MRN: [id number]  HPI Comments: She complains of persistent right hip pain for at least 2 years - she has not been taking anything for the pain. Prior xrays showed mild DJD, the pain is located over the lateral hip and posterior hip with no radiation.     Review of Systems  Constitutional: Negative for fever, chills, diaphoresis, appetite change and fatigue.  HENT: Negative.  Negative for trouble swallowing.   Eyes: Negative.   Respiratory: Negative.  Negative for cough, choking, chest tightness, shortness of breath and stridor.   Cardiovascular: Negative.  Negative for chest pain, palpitations and leg swelling.  Gastrointestinal: Negative.  Negative for nausea, vomiting, abdominal pain, diarrhea, constipation and blood in stool.  Endocrine: Negative.  Negative for polydipsia, polyphagia and polyuria.  Genitourinary: Negative.  Negative for hematuria, flank pain and difficulty urinating.  Musculoskeletal: Positive for arthralgias. Negative for myalgias, back pain and joint swelling.  Skin: Negative.  Negative for rash.  Allergic/Immunologic: Negative.   Neurological: Negative.  Negative for dizziness.  Hematological: Negative.  Negative for adenopathy. Does not bruise/bleed easily.  Psychiatric/Behavioral: Negative.        Objective:   Physical Exam  Constitutional: She is oriented to person, place, and time. She appears well-developed and well-nourished. No distress.  HENT:  Head: Normocephalic and atraumatic.  Mouth/Throat: Oropharynx is clear and moist. No oropharyngeal exudate.  Eyes: Conjunctivae are normal. Right eye exhibits no discharge. Left eye exhibits no discharge. No scleral icterus.  Neck: Normal range of motion. Neck supple. No JVD present. No tracheal deviation present. No thyromegaly present.  Cardiovascular: Normal rate, regular rhythm, normal heart sounds and intact distal pulses.   Exam reveals no gallop and no friction rub.   No murmur heard. Pulmonary/Chest: Effort normal and breath sounds normal. No stridor. No respiratory distress. She has no wheezes. She has no rales. She exhibits no tenderness.  Abdominal: Soft. Bowel sounds are normal. She exhibits no distension and no mass. There is no tenderness. There is no rebound and no guarding.  Musculoskeletal: Normal range of motion. She exhibits no edema or tenderness.       Right hip: Normal. She exhibits normal range of motion, normal strength, no tenderness, no bony tenderness, no swelling, no crepitus, no deformity and no laceration.  Lymphadenopathy:    She has no cervical adenopathy.  Neurological: She is oriented to person, place, and time.  Skin: Skin is warm and dry. No rash noted. She is not diaphoretic. No erythema. No pallor.  Vitals reviewed.    Lab Results  Component Value Date   WBC 8.0 07/09/2013   HGB 12.5 07/09/2013   HCT 38.1 07/09/2013   PLT 192.0 07/09/2013   GLUCOSE 98 07/09/2013   CHOL 260* 07/09/2013   TRIG 106.0 07/09/2013   HDL 46.00 07/09/2013   LDLDIRECT 167.9 11/09/2010   LDLCALC 193* 07/09/2013   ALT 20 07/09/2013   AST 21 07/09/2013   NA 135 07/09/2013   K 4.2 07/09/2013   CL 103 07/09/2013   CREATININE 0.8 07/09/2013   BUN 17 07/09/2013   CO2 24 07/09/2013   TSH 1.81 07/09/2013   HGBA1C 6.6* 07/09/2013   MICROALBUR 1.5 11/09/2010       Assessment & Plan:

## 2014-05-08 ENCOUNTER — Encounter: Payer: Self-pay | Admitting: Internal Medicine

## 2014-05-09 NOTE — Assessment & Plan Note (Signed)
She has achieved her LDL goal and is doing well on the statin

## 2014-05-09 NOTE — Assessment & Plan Note (Signed)
Her blood sugars are well controlled Will follow off of meds for now She will cont to work on her lifestyle modifications

## 2014-05-09 NOTE — Assessment & Plan Note (Signed)
Her BP is well controlled Lytes and renal function are WNL

## 2014-05-09 NOTE — Assessment & Plan Note (Signed)
Will start nsaids for this She does not want to consider PT or surgical treatment options at this time

## 2014-05-12 ENCOUNTER — Ambulatory Visit (HOSPITAL_COMMUNITY)
Admission: RE | Admit: 2014-05-12 | Discharge: 2014-05-12 | Disposition: A | Discharge status: Home / Self Care | Payer: Managed Care, Other (non HMO) | Source: Ambulatory Visit | Attending: Internal Medicine | Admitting: Internal Medicine

## 2014-05-12 DIAGNOSIS — Z1231 Encounter for screening mammogram for malignant neoplasm of breast: Secondary | ICD-10-CM | POA: Insufficient documentation

## 2014-05-15 ENCOUNTER — Telehealth: Payer: Self-pay | Admitting: Internal Medicine

## 2014-05-15 DIAGNOSIS — M1610 Unilateral primary osteoarthritis, unspecified hip: Secondary | ICD-10-CM

## 2014-05-15 NOTE — Telephone Encounter (Signed)
Pharmacy called and explained the prescription Indomethacin ([place]) 40 MG CAPS [[id number] with the discount card is still over $100. Is there something else that can be prescribed

## 2014-05-17 LAB — HM MAMMOGRAPHY: HM Mammogram: NORMAL

## 2014-05-17 MED ORDER — INDOMETHACIN 25 MG PO CAPS: 25.0000 mg | ORAL_CAPSULE | Freq: Three times a day (TID) | ORAL | Status: DC | PRN

## 2014-05-17 NOTE — Addendum Note (Signed)
Addended by: Janith Lima on: 05/17/2014 12:11 PM   Modules accepted: Miquel Dunn

## 2014-05-17 NOTE — Telephone Encounter (Signed)
changed

## 2014-05-18 NOTE — Telephone Encounter (Signed)
Left message advising patient of dr jones note 

## 2014-08-11 ENCOUNTER — Other Ambulatory Visit: Payer: Self-pay

## 2014-08-11 DIAGNOSIS — E785 Hyperlipidemia, unspecified: Secondary | ICD-10-CM

## 2014-08-11 MED ORDER — ATORVASTATIN CALCIUM 40 MG PO TABS: 40.0000 mg | ORAL_TABLET | Freq: Every day | ORAL | Status: DC

## 2014-10-01 ENCOUNTER — Ambulatory Visit (INDEPENDENT_AMBULATORY_CARE_PROVIDER_SITE_OTHER): Payer: Managed Care, Other (non HMO) | Admitting: Internal Medicine

## 2014-10-01 ENCOUNTER — Encounter: Payer: Self-pay | Admitting: Internal Medicine

## 2014-10-01 VITALS — BP 138/80 | HR 71 | Temp 98.0°F | Resp 16 | Ht 67.0 in | Wt 216.0 lb

## 2014-10-01 DIAGNOSIS — M169 Osteoarthritis of hip, unspecified: Secondary | ICD-10-CM | POA: Diagnosis not present

## 2014-10-01 DIAGNOSIS — M1610 Unilateral primary osteoarthritis, unspecified hip: Secondary | ICD-10-CM

## 2014-10-01 DIAGNOSIS — I10 Essential (primary) hypertension: Secondary | ICD-10-CM | POA: Diagnosis not present

## 2014-10-01 MED ORDER — TRAMADOL HCL 50 MG PO TABS: 50.0000 mg | ORAL_TABLET | Freq: Three times a day (TID) | ORAL | Status: DC | PRN

## 2014-10-01 NOTE — Patient Instructions (Signed)

## 2014-10-01 NOTE — Progress Notes (Signed)
Pre visit review using our clinic review tool, if applicable. No additional management support is needed unless otherwise documented below in the visit note. 

## 2014-10-02 NOTE — Assessment & Plan Note (Signed)
Her BP is well controlled 

## 2014-10-02 NOTE — Progress Notes (Signed)
Subjective:  Patient ID: [name], female    DOB: [birth year]  Age: 59 y.o. MRN: [id number]  CC: Osteoarthritis   HPI [name] presents for persistent right hip pain that radiates into her right thigh. She had previous x-rays that showed some degenerative joint disease. She has been taking indomethacin with only minimal symptom relief.  Outpatient Prescriptions Prior to Visit  Medication Sig Dispense Refill  . amLODipine (NORVASC) 10 MG tablet Take 1 tablet (10 mg total) by mouth daily. 90 tablet 3  . atorvastatin (LIPITOR) 40 MG tablet Take 1 tablet (40 mg total) by mouth daily. 90 tablet 3  . Calcium Carbonate-Vitamin D (CALTRATE 600+D) 600-400 MG-UNIT per tablet Take 1 tablet by mouth daily.     . indomethacin (INDOCIN) 25 MG capsule Take 1 capsule (25 mg total) by mouth 3 (three) times daily as needed for moderate pain. 90 capsule 5  . losartan (COZAAR) 100 MG tablet Take 1 tablet (100 mg total) by mouth daily. 90 tablet 3  . Multiple Vitamin (MULTIVITAMIN) tablet Take 1 tablet by mouth daily.       No facility-administered medications prior to visit.    ROS Review of Systems  Constitutional: Negative.   HENT: Negative.   Eyes: Negative.   Respiratory: Negative.  Negative for cough, choking, chest tightness, shortness of breath and stridor.   Cardiovascular: Negative.  Negative for chest pain, palpitations and leg swelling.  Gastrointestinal: Negative.  Negative for nausea, vomiting, abdominal pain and diarrhea.  Endocrine: Negative.   Genitourinary: Negative.   Musculoskeletal: Positive for arthralgias. Negative for myalgias, back pain, joint swelling and neck pain.  Skin: Negative.   Allergic/Immunologic: Negative.   Neurological: Negative.   Hematological: Negative.  Negative for adenopathy. Does not bruise/bleed easily.  Psychiatric/Behavioral: Negative.     Objective:  BP 138/80 mmHg  Pulse 71  Temp(Src) 98 F (36.7 C) (Oral)  Ht 5\' 7"  (1.702 m)  Wt  216 lb (97.977 kg)  BMI 33.82 kg/m2  SpO2 92%  LMP 07/18/2012  BP Readings from Last 3 Encounters:  10/01/14 138/80  05/07/14 128/80  10/07/13 136/80    Wt Readings from Last 3 Encounters:  10/01/14 216 lb (97.977 kg)  05/07/14 214 lb (97.07 kg)  10/07/13 214 lb (97.07 kg)    Physical Exam  Constitutional: She is oriented to person, place, and time. No distress.  HENT:  Mouth/Throat: Oropharynx is clear and moist. No oropharyngeal exudate.  Eyes: Conjunctivae are normal. Right eye exhibits no discharge. Left eye exhibits no discharge. No scleral icterus.  Neck: Normal range of motion. Neck supple. No JVD present. No tracheal deviation present. No thyromegaly present.  Cardiovascular: Normal rate, regular rhythm, normal heart sounds and intact distal pulses.  Exam reveals no gallop and no friction rub.   No murmur heard. Pulmonary/Chest: Effort normal and breath sounds normal. No stridor. No respiratory distress. She has no wheezes. She has no rales. She exhibits no tenderness.  Abdominal: Soft. Bowel sounds are normal. She exhibits no distension and no mass. There is no tenderness. There is no rebound and no guarding.  Musculoskeletal: Normal range of motion. She exhibits no edema or tenderness.       Right hip: Normal. She exhibits normal range of motion, normal strength, no tenderness, no bony tenderness, no swelling, no crepitus, no deformity and no laceration.  Lymphadenopathy:    She has no cervical adenopathy.  Neurological: She is alert and oriented to person, place, and time. She  has normal strength. She displays no atrophy, no tremor and normal reflexes. No cranial nerve deficit or sensory deficit. She exhibits normal muscle tone. She displays a negative Romberg sign. She displays no seizure activity. Coordination and gait normal.  Reflex Scores:      Bicep reflexes are 1+ on the right side and 1+ on the left side.      Patellar reflexes are 1+ on the right side and 1+ on  the left side.      Achilles reflexes are 1+ on the right side and 1+ on the left side. Skin: Skin is warm and dry. No rash noted. She is not diaphoretic. No erythema. No pallor.  Vitals reviewed.   Lab Results  Component Value Date   WBC 7.8 05/07/2014   HGB 12.5 05/07/2014   HCT 37.0 05/07/2014   PLT 198.0 05/07/2014   GLUCOSE 89 05/07/2014   CHOL 211* 05/07/2014   TRIG 137.0 05/07/2014   HDL 44.70 05/07/2014   LDLDIRECT 167.9 11/09/2010   LDLCALC 139* 05/07/2014   ALT 17 05/07/2014   AST 19 05/07/2014   NA 140 05/07/2014   K 3.8 05/07/2014   CL 107 05/07/2014   CREATININE 0.70 05/07/2014   BUN 17 05/07/2014   CO2 27 05/07/2014   TSH 1.41 05/07/2014   HGBA1C 6.4 05/07/2014   MICROALBUR 2.8* 05/07/2014    Mm Digital Screening Bilateral  05/13/2014   CLINICAL DATA:  Screening.  EXAM: DIGITAL SCREENING BILATERAL MAMMOGRAM WITH CAD  COMPARISON:  Previous exam(s).  ACR Breast Density Category b: There are scattered areas of fibroglandular density.  FINDINGS: There are no findings suspicious for malignancy. Images were processed with CAD.  IMPRESSION: No mammographic evidence of malignancy. A result letter of this screening mammogram will be mailed directly to the patient.  RECOMMENDATION: Screening mammogram in one year. (Code:SM-B-01Y)  BI-RADS CATEGORY  1: Negative.   Electronically Signed   By: Lillia Mountain M.D.   On: 05/13/2014 07:57    Assessment & Plan:   [name] was seen today for osteoarthritis.  Diagnoses and all orders for this visit:  DJD (degenerative joint disease) of pelvis- she will continue taking anti-inflammatories and will also try tramadol for pain relief. I've also asked her to see orthopedics to see if additional treatment options are available. -     traMADol (ULTRAM) 50 MG tablet; Take 1 tablet (50 mg total) by mouth every 8 (eight) hours as needed. -     Ambulatory referral to Orthopedic Surgery   I am having [name] start on traMADol. I am also  having her maintain her Calcium Carbonate-Vitamin D, multivitamin, losartan, amLODipine, indomethacin, and atorvastatin.  Meds ordered this encounter  Medications  . traMADol (ULTRAM) 50 MG tablet    Sig: Take 1 tablet (50 mg total) by mouth every 8 (eight) hours as needed.    Dispense:  65 tablet    Refill:  2     Follow-up: Return in about 3 months (around 12/31/2014).  Scarlette Calico, MD

## 2014-10-23 ENCOUNTER — Telehealth: Payer: Self-pay | Admitting: Internal Medicine

## 2014-10-23 DIAGNOSIS — M1610 Unilateral primary osteoarthritis, unspecified hip: Secondary | ICD-10-CM

## 2014-10-23 NOTE — Telephone Encounter (Signed)
Patient states that she misplaced her traMADol (ULTRAM) 50 MG tablet [[id number]] script and requests another. Pharmacy is [facility] [address]

## 2014-10-26 NOTE — Telephone Encounter (Signed)
yes

## 2014-10-27 MED ORDER — TRAMADOL HCL 50 MG PO TABS: 50.0000 mg | ORAL_TABLET | Freq: Three times a day (TID) | ORAL | Status: DC | PRN

## 2015-04-04 ENCOUNTER — Other Ambulatory Visit: Payer: Self-pay | Admitting: Internal Medicine

## 2015-06-09 LAB — HM PAP SMEAR

## 2015-07-16 ENCOUNTER — Ambulatory Visit (INDEPENDENT_AMBULATORY_CARE_PROVIDER_SITE_OTHER): Payer: Managed Care, Other (non HMO) | Admitting: Internal Medicine

## 2015-07-16 ENCOUNTER — Other Ambulatory Visit (INDEPENDENT_AMBULATORY_CARE_PROVIDER_SITE_OTHER): Payer: Managed Care, Other (non HMO)

## 2015-07-16 ENCOUNTER — Encounter: Payer: Self-pay | Admitting: Internal Medicine

## 2015-07-16 VITALS — BP 120/76 | HR 76 | Temp 98.0°F | Resp 16 | Ht 67.0 in | Wt 215.0 lb

## 2015-07-16 DIAGNOSIS — E118 Type 2 diabetes mellitus with unspecified complications: Secondary | ICD-10-CM

## 2015-07-16 DIAGNOSIS — M169 Osteoarthritis of hip, unspecified: Secondary | ICD-10-CM | POA: Diagnosis not present

## 2015-07-16 DIAGNOSIS — Z0000 Encounter for general adult medical examination without abnormal findings: Secondary | ICD-10-CM

## 2015-07-16 DIAGNOSIS — Z1231 Encounter for screening mammogram for malignant neoplasm of breast: Secondary | ICD-10-CM

## 2015-07-16 DIAGNOSIS — I10 Essential (primary) hypertension: Secondary | ICD-10-CM | POA: Diagnosis not present

## 2015-07-16 DIAGNOSIS — M1610 Unilateral primary osteoarthritis, unspecified hip: Secondary | ICD-10-CM

## 2015-07-16 LAB — CBC WITH DIFFERENTIAL/PLATELET
BASOS PCT: 0.3 % (ref 0.0–3.0)
Basophils Absolute: 0 10*3/uL (ref 0.0–0.1)
Eosinophils Absolute: 0.3 10*3/uL (ref 0.0–0.7)
Eosinophils Relative: 4.1 % (ref 0.0–5.0)
HEMATOCRIT: 37.5 % (ref 36.0–46.0)
HEMOGLOBIN: 12.3 g/dL (ref 12.0–15.0)
LYMPHS PCT: 23.8 % (ref 12.0–46.0)
Lymphs Abs: 1.9 10*3/uL (ref 0.7–4.0)
MCHC: 32.7 g/dL (ref 30.0–36.0)
MCV: 78.9 fl (ref 78.0–100.0)
MONOS PCT: 6.7 % (ref 3.0–12.0)
Monocytes Absolute: 0.5 10*3/uL (ref 0.1–1.0)
NEUTROS ABS: 5.2 10*3/uL (ref 1.4–7.7)
Neutrophils Relative %: 65.1 % (ref 43.0–77.0)
PLATELETS: 189 10*3/uL (ref 150.0–400.0)
RBC: 4.76 Mil/uL (ref 3.87–5.11)
RDW: 15.5 % (ref 11.5–15.5)
WBC: 8.1 10*3/uL (ref 4.0–10.5)

## 2015-07-16 LAB — URINALYSIS, ROUTINE W REFLEX MICROSCOPIC
Bilirubin Urine: NEGATIVE
Hgb urine dipstick: NEGATIVE
KETONES UR: NEGATIVE
Nitrite: NEGATIVE
PH: 5 (ref 5.0–8.0)
Total Protein, Urine: NEGATIVE
UROBILINOGEN UA: 0.2 (ref 0.0–1.0)
Urine Glucose: NEGATIVE

## 2015-07-16 LAB — COMPREHENSIVE METABOLIC PANEL
ALT: 14 U/L (ref 0–35)
AST: 16 U/L (ref 0–37)
Albumin: 4.3 g/dL (ref 3.5–5.2)
Alkaline Phosphatase: 57 U/L (ref 39–117)
BUN: 19 mg/dL (ref 6–23)
CALCIUM: 9.7 mg/dL (ref 8.4–10.5)
CHLORIDE: 105 meq/L (ref 96–112)
CO2: 25 meq/L (ref 19–32)
Creatinine, Ser: 0.75 mg/dL (ref 0.40–1.20)
GFR: 101.97 mL/min (ref >60.00)
Glucose, Bld: 87 mg/dL (ref 70–99)
POTASSIUM: 3.8 meq/L (ref 3.5–5.1)
Sodium: 139 mEq/L (ref 135–145)
Total Bilirubin: 0.4 mg/dL (ref 0.2–1.2)
Total Protein: 7.9 g/dL (ref 6.0–8.3)

## 2015-07-16 LAB — MICROALBUMIN / CREATININE URINE RATIO
Creatinine,U: 180.3 mg/dL
MICROALB UR: 1.1 mg/dL (ref 0.0–1.9)
MICROALB/CREAT RATIO: 0.6 mg/g (ref 0.0–30.0)

## 2015-07-16 LAB — LIPID PANEL
CHOL/HDL RATIO: 4
Cholesterol: 188 mg/dL (ref 0–200)
HDL: 44.1 mg/dL (ref >39.00)
LDL CALC: 127 mg/dL — AB (ref 0–99)
NONHDL: 144.14
Triglycerides: 86 mg/dL (ref 0.0–149.0)
VLDL: 17.2 mg/dL (ref 0.0–40.0)

## 2015-07-16 LAB — HEMOGLOBIN A1C: Hgb A1c MFr Bld: 6.3 % (ref 4.6–6.5)

## 2015-07-16 LAB — TSH: TSH: 1.25 u[IU]/mL (ref 0.35–4.50)

## 2015-07-16 MED ORDER — IBUPROFEN 800 MG PO TABS: 800.0000 mg | ORAL_TABLET | Freq: Three times a day (TID) | ORAL | Status: DC | PRN

## 2015-07-16 NOTE — Patient Instructions (Signed)
Preventive Care for Adults, Female A healthy lifestyle and preventive care can promote health and wellness. Preventive health guidelines for women include the following key practices.  A routine yearly physical is a good way to check with your health care provider about your health and preventive screening. It is a chance to share any concerns and updates on your health and to receive a thorough exam.  Visit your dentist for a routine exam and preventive care every 6 months. Brush your teeth twice a day and floss once a day. Good oral hygiene prevents tooth decay and gum disease.  The frequency of eye exams is based on your age, health, family medical history, use of contact lenses, and other factors. Follow your health care provider's recommendations for frequency of eye exams.  Eat a healthy diet. Foods like vegetables, fruits, whole grains, low-fat dairy products, and lean protein foods contain the nutrients you need without too many calories. Decrease your intake of foods high in solid fats, added sugars, and salt. Eat the right amount of calories for you.Get information about a proper diet from your health care provider, if necessary.  Regular physical exercise is one of the most important things you can do for your health. Most adults should get at least 150 minutes of moderate-intensity exercise (any activity that increases your heart rate and causes you to sweat) each week. In addition, most adults need muscle-strengthening exercises on 2 or more days a week.  Maintain a healthy weight. The body mass index (BMI) is a screening tool to identify possible weight problems. It provides an estimate of body fat based on height and weight. Your health care provider can find your BMI and can help you achieve or maintain a healthy weight.For adults 20 years and older:  A BMI below 18.5 is considered underweight.  A BMI of 18.5 to 24.9 is normal.  A BMI of 25 to 29.9 is considered overweight.  A  BMI of 30 and above is considered obese.  Maintain normal blood lipids and cholesterol levels by exercising and minimizing your intake of saturated fat. Eat a balanced diet with plenty of fruit and vegetables. Blood tests for lipids and cholesterol should begin at age 45 and be repeated every 5 years. If your lipid or cholesterol levels are high, you are over 50, or you are at high risk for heart disease, you may need your cholesterol levels checked more frequently.Ongoing high lipid and cholesterol levels should be treated with medicines if diet and exercise are not working.  If you smoke, find out from your health care provider how to quit. If you do not use tobacco, do not start.  Lung cancer screening is recommended for adults aged 45-80 years who are at high risk for developing lung cancer because of a history of smoking. A yearly low-dose CT scan of the lungs is recommended for people who have at least a 30-pack-year history of smoking and are a current smoker or have quit within the past 15 years. A pack year of smoking is smoking an average of 1 pack of cigarettes a day for 1 year (for example: 1 pack a day for 30 years or 2 packs a day for 15 years). Yearly screening should continue until the smoker has stopped smoking for at least 15 years. Yearly screening should be stopped for people who develop a health problem that would prevent them from having lung cancer treatment.  If you are pregnant, do not drink alcohol. If you are  breastfeeding, be very cautious about drinking alcohol. If you are not pregnant and choose to drink alcohol, do not have more than 1 drink per day. One drink is considered to be 12 ounces (355 mL) of beer, 5 ounces (148 mL) of wine, or 1.5 ounces (44 mL) of liquor.  Avoid use of street drugs. Do not share needles with anyone. Ask for help if you need support or instructions about stopping the use of drugs.  High blood pressure causes heart disease and increases the risk  of stroke. Your blood pressure should be checked at least every 1 to 2 years. Ongoing high blood pressure should be treated with medicines if weight loss and exercise do not work.  If you are 55-79 years old, ask your health care provider if you should take aspirin to prevent strokes.  Diabetes screening is done by taking a blood sample to check your blood glucose level after you have not eaten for a certain period of time (fasting). If you are not overweight and you do not have risk factors for diabetes, you should be screened once every 3 years starting at age 45. If you are overweight or obese and you are 40-70 years of age, you should be screened for diabetes every year as part of your cardiovascular risk assessment.  Breast cancer screening is essential preventive care for women. You should practice "breast self-awareness." This means understanding the normal appearance and feel of your breasts and may include breast self-examination. Any changes detected, no matter how small, should be reported to a health care provider. Women in their 20s and 30s should have a clinical breast exam (CBE) by a health care provider as part of a regular health exam every 1 to 3 years. After age 40, women should have a CBE every year. Starting at age 40, women should consider having a mammogram (breast X-ray test) every year. Women who have a family history of breast cancer should talk to their health care provider about genetic screening. Women at a high risk of breast cancer should talk to their health care providers about having an MRI and a mammogram every year.  Breast cancer gene (BRCA)-related cancer risk assessment is recommended for women who have family members with BRCA-related cancers. BRCA-related cancers include breast, ovarian, tubal, and peritoneal cancers. Having family members with these cancers may be associated with an increased risk for harmful changes (mutations) in the breast cancer genes BRCA1 and  BRCA2. Results of the assessment will determine the need for genetic counseling and BRCA1 and BRCA2 testing.  Your health care provider may recommend that you be screened regularly for cancer of the pelvic organs (ovaries, uterus, and vagina). This screening involves a pelvic examination, including checking for microscopic changes to the surface of your cervix (Pap test). You may be encouraged to have this screening done every 3 years, beginning at age 21.  For women ages 30-65, health care providers may recommend pelvic exams and Pap testing every 3 years, or they may recommend the Pap and pelvic exam, combined with testing for human papilloma virus (HPV), every 5 years. Some types of HPV increase your risk of cervical cancer. Testing for HPV may also be done on women of any age with unclear Pap test results.  Other health care providers may not recommend any screening for nonpregnant women who are considered low risk for pelvic cancer and who do not have symptoms. Ask your health care provider if a screening pelvic exam is right for   you.  If you have had past treatment for cervical cancer or a condition that could lead to cancer, you need Pap tests and screening for cancer for at least 20 years after your treatment. If Pap tests have been discontinued, your risk factors (such as having a new sexual partner) need to be reassessed to determine if screening should resume. Some women have medical problems that increase the chance of getting cervical cancer. In these cases, your health care provider may recommend more frequent screening and Pap tests.  Colorectal cancer can be detected and often prevented. Most routine colorectal cancer screening begins at the age of 50 years and continues through age 75 years. However, your health care provider may recommend screening at an earlier age if you have risk factors for colon cancer. On a yearly basis, your health care provider may provide home test kits to check  for hidden blood in the stool. Use of a small camera at the end of a tube, to directly examine the colon (sigmoidoscopy or colonoscopy), can detect the earliest forms of colorectal cancer. Talk to your health care provider about this at age 50, when routine screening begins. Direct exam of the colon should be repeated every 5-10 years through age 75 years, unless early forms of precancerous polyps or small growths are found.  People who are at an increased risk for hepatitis B should be screened for this virus. You are considered at high risk for hepatitis B if:  You were born in a country where hepatitis B occurs often. Talk with your health care provider about which countries are considered high risk.  Your parents were born in a high-risk country and you have not received a shot to protect against hepatitis B (hepatitis B vaccine).  You have HIV or AIDS.  You use needles to inject street drugs.  You live with, or have sex with, someone who has hepatitis B.  You get hemodialysis treatment.  You take certain medicines for conditions like cancer, organ transplantation, and autoimmune conditions.  Hepatitis C blood testing is recommended for all people born from 1945 through 1965 and any individual with known risks for hepatitis C.  Practice safe sex. Use condoms and avoid high-risk sexual practices to reduce the spread of sexually transmitted infections (STIs). STIs include gonorrhea, chlamydia, syphilis, trichomonas, herpes, HPV, and human immunodeficiency virus (HIV). Herpes, HIV, and HPV are viral illnesses that have no cure. They can result in disability, cancer, and death.  You should be screened for sexually transmitted illnesses (STIs) including gonorrhea and chlamydia if:  You are sexually active and are younger than 24 years.  You are older than 24 years and your health care provider tells you that you are at risk for this type of infection.  Your sexual activity has changed  since you were last screened and you are at an increased risk for chlamydia or gonorrhea. Ask your health care provider if you are at risk.  If you are at risk of being infected with HIV, it is recommended that you take a prescription medicine daily to prevent HIV infection. This is called preexposure prophylaxis (PrEP). You are considered at risk if:  You are sexually active and do not regularly use condoms or know the HIV status of your partner(s).  You take drugs by injection.  You are sexually active with a partner who has HIV.  Talk with your health care provider about whether you are at high risk of being infected with HIV. If   you choose to begin PrEP, you should first be tested for HIV. You should then be tested every 3 months for as long as you are taking PrEP.  Osteoporosis is a disease in which the bones lose minerals and strength with aging. This can result in serious bone fractures or breaks. The risk of osteoporosis can be identified using a bone density scan. Women ages 67 years and over and women at risk for fractures or osteoporosis should discuss screening with their health care providers. Ask your health care provider whether you should take a calcium supplement or vitamin D to reduce the rate of osteoporosis.  Menopause can be associated with physical symptoms and risks. Hormone replacement therapy is available to decrease symptoms and risks. You should talk to your health care provider about whether hormone replacement therapy is right for you.  Use sunscreen. Apply sunscreen liberally and repeatedly throughout the day. You should seek shade when your shadow is shorter than you. Protect yourself by wearing long sleeves, pants, a wide-brimmed hat, and sunglasses year round, whenever you are outdoors.  Once a month, do a whole body skin exam, using a mirror to look at the skin on your back. Tell your health care provider of new moles, moles that have irregular borders, moles that  are larger than a pencil eraser, or moles that have changed in shape or color.  Stay current with required vaccines (immunizations).  Influenza vaccine. All adults should be immunized every year.  Tetanus, diphtheria, and acellular pertussis (Td, Tdap) vaccine. Pregnant women should receive 1 dose of Tdap vaccine during each pregnancy. The dose should be obtained regardless of the length of time since the last dose. Immunization is preferred during the 27th-36th week of gestation. An adult who has not previously received Tdap or who does not know her vaccine status should receive 1 dose of Tdap. This initial dose should be followed by tetanus and diphtheria toxoids (Td) booster doses every 10 years. Adults with an unknown or incomplete history of completing a 3-dose immunization series with Td-containing vaccines should begin or complete a primary immunization series including a Tdap dose. Adults should receive a Td booster every 10 years.  Varicella vaccine. An adult without evidence of immunity to varicella should receive 2 doses or a second dose if she has previously received 1 dose. Pregnant females who do not have evidence of immunity should receive the first dose after pregnancy. This first dose should be obtained before leaving the health care facility. The second dose should be obtained 4-8 weeks after the first dose.  Human papillomavirus (HPV) vaccine. Females aged 13-26 years who have not received the vaccine previously should obtain the 3-dose series. The vaccine is not recommended for use in pregnant females. However, pregnancy testing is not needed before receiving a dose. If a female is found to be pregnant after receiving a dose, no treatment is needed. In that case, the remaining doses should be delayed until after the pregnancy. Immunization is recommended for any person with an immunocompromised condition through the age of 61 years if she did not get any or all doses earlier. During the  3-dose series, the second dose should be obtained 4-8 weeks after the first dose. The third dose should be obtained 24 weeks after the first dose and 16 weeks after the second dose.  Zoster vaccine. One dose is recommended for adults aged 30 years or older unless certain conditions are present.  Measles, mumps, and rubella (MMR) vaccine. Adults born  before 1957 generally are considered immune to measles and mumps. Adults born in 1957 or later should have 1 or more doses of MMR vaccine unless there is a contraindication to the vaccine or there is laboratory evidence of immunity to each of the three diseases. A routine second dose of MMR vaccine should be obtained at least 28 days after the first dose for students attending postsecondary schools, health care workers, or international travelers. People who received inactivated measles vaccine or an unknown type of measles vaccine during 1963-1967 should receive 2 doses of MMR vaccine. People who received inactivated mumps vaccine or an unknown type of mumps vaccine before 1979 and are at high risk for mumps infection should consider immunization with 2 doses of MMR vaccine. For females of childbearing age, rubella immunity should be determined. If there is no evidence of immunity, females who are not pregnant should be vaccinated. If there is no evidence of immunity, females who are pregnant should delay immunization until after pregnancy. Unvaccinated health care workers born before 1957 who lack laboratory evidence of measles, mumps, or rubella immunity or laboratory confirmation of disease should consider measles and mumps immunization with 2 doses of MMR vaccine or rubella immunization with 1 dose of MMR vaccine.  Pneumococcal 13-valent conjugate (PCV13) vaccine. When indicated, a person who is uncertain of his immunization history and has no record of immunization should receive the PCV13 vaccine. All adults 65 years of age and older should receive this  vaccine. An adult aged 19 years or older who has certain medical conditions and has not been previously immunized should receive 1 dose of PCV13 vaccine. This PCV13 should be followed with a dose of pneumococcal polysaccharide (PPSV23) vaccine. Adults who are at high risk for pneumococcal disease should obtain the PPSV23 vaccine at least 8 weeks after the dose of PCV13 vaccine. Adults older than 58 years of age who have normal immune system function should obtain the PPSV23 vaccine dose at least 1 year after the dose of PCV13 vaccine.  Pneumococcal polysaccharide (PPSV23) vaccine. When PCV13 is also indicated, PCV13 should be obtained first. All adults aged 65 years and older should be immunized. An adult younger than age 65 years who has certain medical conditions should be immunized. Any person who resides in a nursing home or long-term care facility should be immunized. An adult smoker should be immunized. People with an immunocompromised condition and certain other conditions should receive both PCV13 and PPSV23 vaccines. People with human immunodeficiency virus (HIV) infection should be immunized as soon as possible after diagnosis. Immunization during chemotherapy or radiation therapy should be avoided. Routine use of PPSV23 vaccine is not recommended for American Indians, [place] Natives, or people younger than 65 years unless there are medical conditions that require PPSV23 vaccine. When indicated, people who have unknown immunization and have no record of immunization should receive PPSV23 vaccine. One-time revaccination 5 years after the first dose of PPSV23 is recommended for people aged 19-64 years who have chronic kidney failure, nephrotic syndrome, asplenia, or immunocompromised conditions. People who received 1-2 doses of PPSV23 before age 65 years should receive another dose of PPSV23 vaccine at age 65 years or later if at least 5 years have passed since the previous dose. Doses of PPSV23 are not  needed for people immunized with PPSV23 at or after age 65 years.  Meningococcal vaccine. Adults with asplenia or persistent complement component deficiencies should receive 2 doses of quadrivalent meningococcal conjugate (MenACWY-D) vaccine. The doses should be obtained   at least 2 months apart. Microbiologists working with certain meningococcal bacteria, [place] recruits, people at risk during an outbreak, and people who travel to or live in countries with a high rate of meningitis should be immunized. A first-year college student up through age 34 years who is living in a residence hall should receive a dose if she did not receive a dose on or after her 16th birthday. Adults who have certain high-risk conditions should receive one or more doses of vaccine.  Hepatitis A vaccine. Adults who wish to be protected from this disease, have certain high-risk conditions, work with hepatitis A-infected animals, work in hepatitis A research labs, or travel to or work in countries with a high rate of hepatitis A should be immunized. Adults who were previously unvaccinated and who anticipate close contact with an international adoptee during the first 60 days after arrival in the [place] from a country with a high rate of hepatitis A should be immunized.  Hepatitis B vaccine. Adults who wish to be protected from this disease, have certain high-risk conditions, may be exposed to blood or other infectious body fluids, are household contacts or sex partners of hepatitis B positive people, are clients or workers in certain care facilities, or travel to or work in countries with a high rate of hepatitis B should be immunized.  Haemophilus influenzae type b (Hib) vaccine. A previously unvaccinated person with asplenia or sickle cell disease or having a scheduled splenectomy should receive 1 dose of Hib vaccine. Regardless of previous immunization, a recipient of a hematopoietic stem cell transplant should receive a  3-dose series 6-12 months after her successful transplant. Hib vaccine is not recommended for adults with HIV infection. Preventive Services / Frequency Ages 35 to 4 years  Blood pressure check.** / Every 3-5 years.  Lipid and cholesterol check.** / Every 5 years beginning at age 60.  Clinical breast exam.** / Every 3 years for women in their 71s and 10s.  BRCA-related cancer risk assessment.** / For women who have family members with a BRCA-related cancer (breast, ovarian, tubal, or peritoneal cancers).  Pap test.** / Every 2 years from ages 76 through 26. Every 3 years starting at age 61 through age 76 or 93 with a history of 3 consecutive normal Pap tests.  HPV screening.** / Every 3 years from ages 37 through ages 60 to 51 with a history of 3 consecutive normal Pap tests.  Hepatitis C blood test.** / For any individual with known risks for hepatitis C.  Skin self-exam. / Monthly.  Influenza vaccine. / Every year.  Tetanus, diphtheria, and acellular pertussis (Tdap, Td) vaccine.** / Consult your health care provider. Pregnant women should receive 1 dose of Tdap vaccine during each pregnancy. 1 dose of Td every 10 years.  Varicella vaccine.** / Consult your health care provider. Pregnant females who do not have evidence of immunity should receive the first dose after pregnancy.  HPV vaccine. / 3 doses over 6 months, if 93 and younger. The vaccine is not recommended for use in pregnant females. However, pregnancy testing is not needed before receiving a dose.  Measles, mumps, rubella (MMR) vaccine.** / You need at least 1 dose of MMR if you were born in 1957 or later. You may also need a 2nd dose. For females of childbearing age, rubella immunity should be determined. If there is no evidence of immunity, females who are not pregnant should be vaccinated. If there is no evidence of immunity, females who are  pregnant should delay immunization until after pregnancy.  Pneumococcal  13-valent conjugate (PCV13) vaccine.** / Consult your health care provider.  Pneumococcal polysaccharide (PPSV23) vaccine.** / 1 to 2 doses if you smoke cigarettes or if you have certain conditions.  Meningococcal vaccine.** / 1 dose if you are age 68 to 8 years and a Market researcher living in a residence hall, or have one of several medical conditions, you need to get vaccinated against meningococcal disease. You may also need additional booster doses.  Hepatitis A vaccine.** / Consult your health care provider.  Hepatitis B vaccine.** / Consult your health care provider.  Haemophilus influenzae type b (Hib) vaccine.** / Consult your health care provider. Ages 7 to 53 years  Blood pressure check.** / Every year.  Lipid and cholesterol check.** / Every 5 years beginning at age 25 years.  Lung cancer screening. / Every year if you are aged 11-80 years and have a 30-pack-year history of smoking and currently smoke or have quit within the past 15 years. Yearly screening is stopped once you have quit smoking for at least 15 years or develop a health problem that would prevent you from having lung cancer treatment.  Clinical breast exam.** / Every year after age 48 years.  BRCA-related cancer risk assessment.** / For women who have family members with a BRCA-related cancer (breast, ovarian, tubal, or peritoneal cancers).  Mammogram.** / Every year beginning at age 41 years and continuing for as long as you are in good health. Consult with your health care provider.  Pap test.** / Every 3 years starting at age 65 years through age 37 or 70 years with a history of 3 consecutive normal Pap tests.  HPV screening.** / Every 3 years from ages 72 years through ages 60 to 40 years with a history of 3 consecutive normal Pap tests.  Fecal occult blood test (FOBT) of stool. / Every year beginning at age 21 years and continuing until age 5 years. You may not need to do this test if you get  a colonoscopy every 10 years.  Flexible sigmoidoscopy or colonoscopy.** / Every 5 years for a flexible sigmoidoscopy or every 10 years for a colonoscopy beginning at age 35 years and continuing until age 48 years.  Hepatitis C blood test.** / For all people born from [birth year] through 1965 and any individual with known risks for hepatitis C.  Skin self-exam. / Monthly.  Influenza vaccine. / Every year.  Tetanus, diphtheria, and acellular pertussis (Tdap/Td) vaccine.** / Consult your health care provider. Pregnant women should receive 1 dose of Tdap vaccine during each pregnancy. 1 dose of Td every 10 years.  Varicella vaccine.** / Consult your health care provider. Pregnant females who do not have evidence of immunity should receive the first dose after pregnancy.  Zoster vaccine.** / 1 dose for adults aged 30 years or older.  Measles, mumps, rubella (MMR) vaccine.** / You need at least 1 dose of MMR if you were born in 1957 or later. You may also need a second dose. For females of childbearing age, rubella immunity should be determined. If there is no evidence of immunity, females who are not pregnant should be vaccinated. If there is no evidence of immunity, females who are pregnant should delay immunization until after pregnancy.  Pneumococcal 13-valent conjugate (PCV13) vaccine.** / Consult your health care provider.  Pneumococcal polysaccharide (PPSV23) vaccine.** / 1 to 2 doses if you smoke cigarettes or if you have certain conditions.  Meningococcal vaccine.** /  Consult your health care provider.  Hepatitis A vaccine.** / Consult your health care provider.  Hepatitis B vaccine.** / Consult your health care provider.  Haemophilus influenzae type b (Hib) vaccine.** / Consult your health care provider. Ages 64 years and over  Blood pressure check.** / Every year.  Lipid and cholesterol check.** / Every 5 years beginning at age 23 years.  Lung cancer screening. / Every year if you  are aged 16-80 years and have a 30-pack-year history of smoking and currently smoke or have quit within the past 15 years. Yearly screening is stopped once you have quit smoking for at least 15 years or develop a health problem that would prevent you from having lung cancer treatment.  Clinical breast exam.** / Every year after age 74 years.  BRCA-related cancer risk assessment.** / For women who have family members with a BRCA-related cancer (breast, ovarian, tubal, or peritoneal cancers).  Mammogram.** / Every year beginning at age 44 years and continuing for as long as you are in good health. Consult with your health care provider.  Pap test.** / Every 3 years starting at age 58 years through age 22 or 39 years with 3 consecutive normal Pap tests. Testing can be stopped between 65 and 70 years with 3 consecutive normal Pap tests and no abnormal Pap or HPV tests in the past 10 years.  HPV screening.** / Every 3 years from ages 64 years through ages 70 or 61 years with a history of 3 consecutive normal Pap tests. Testing can be stopped between 65 and 70 years with 3 consecutive normal Pap tests and no abnormal Pap or HPV tests in the past 10 years.  Fecal occult blood test (FOBT) of stool. / Every year beginning at age 40 years and continuing until age 27 years. You may not need to do this test if you get a colonoscopy every 10 years.  Flexible sigmoidoscopy or colonoscopy.** / Every 5 years for a flexible sigmoidoscopy or every 10 years for a colonoscopy beginning at age 7 years and continuing until age 32 years.  Hepatitis C blood test.** / For all people born from [birth year] through 1963 and any individual with known risks for hepatitis C.  Osteoporosis screening.** / A one-time screening for women ages 30 years and over and women at risk for fractures or osteoporosis.  Skin self-exam. / Monthly.  Influenza vaccine. / Every year.  Tetanus, diphtheria, and acellular pertussis (Tdap/Td)  vaccine.** / 1 dose of Td every 10 years.  Varicella vaccine.** / Consult your health care provider.  Zoster vaccine.** / 1 dose for adults aged 35 years or older.  Pneumococcal 13-valent conjugate (PCV13) vaccine.** / Consult your health care provider.  Pneumococcal polysaccharide (PPSV23) vaccine.** / 1 dose for all adults aged 46 years and older.  Meningococcal vaccine.** / Consult your health care provider.  Hepatitis A vaccine.** / Consult your health care provider.  Hepatitis B vaccine.** / Consult your health care provider.  Haemophilus influenzae type b (Hib) vaccine.** / Consult your health care provider. ** Family history and personal history of risk and conditions may change your health care provider's recommendations.   This information is not intended to replace advice given to you by your health care provider. Make sure you discuss any questions you have with your health care provider.   Document Released: 02/14/2001 Document Revised: 01/09/2014 Document Reviewed: 05/16/2010 Elsevier Interactive Patient Education [employer].

## 2015-07-16 NOTE — Progress Notes (Signed)
Subjective:  Patient ID: [name], female    DOB: [birth year]  Age: 60 y.o. MRN: [id number]  CC: Annual Exam; Hypertension; Diabetes; and Osteoarthritis   HPI Alexine L Goodner presents for a CPX. She tells me she saw her gynecologist about a month ago and had a Pap smear done but she needs to have a mammogram ordered.  She complains of persistent right hip pain. She tells me that she has seen an orthopedist and had an MRI done of her right hip and was found to have mild arthritis by her description. She was told that surgery is not indicated. She was previously prescribed tramadol and Indocin for pain relief but she wants to try Motrin. The pain is located in her right posterior hip and does not radiate into her lower extremities. She denies back pain or paresthesias.  She tells me her blood pressure is well controlled on the combination of losartan and amlodipine. She has had no recent episodes of headache/blurred vision/chest pain/shortness of breath/DOE/palpitations/fatigue/or near syncope.  Outpatient Prescriptions Prior to Visit  Medication Sig Dispense Refill  . amLODipine (NORVASC) 10 MG tablet TAKE 1 TABLET BY MOUTH DAILY 90 tablet 2  . atorvastatin (LIPITOR) 40 MG tablet Take 1 tablet (40 mg total) by mouth daily. 90 tablet 3  . Calcium Carbonate-Vitamin D (CALTRATE 600+D) 600-400 MG-UNIT per tablet Take 1 tablet by mouth daily.     Marland Kitchen losartan (COZAAR) 100 MG tablet Take 1 tablet (100 mg total) by mouth daily. 90 tablet 3  . traMADol (ULTRAM) 50 MG tablet Take 1 tablet (50 mg total) by mouth every 8 (eight) hours as needed. 65 tablet 2  . indomethacin (INDOCIN) 25 MG capsule Take 1 capsule (25 mg total) by mouth 3 (three) times daily as needed for moderate pain. 90 capsule 5  . Multiple Vitamin (MULTIVITAMIN) tablet Take 1 tablet by mouth daily.       No facility-administered medications prior to visit.    ROS Review of Systems  Constitutional: Negative.  Negative for fever,  diaphoresis, appetite change and fatigue.  HENT: Negative.  Negative for sinus pressure and trouble swallowing.   Eyes: Negative.   Respiratory: Negative.  Negative for cough, choking, chest tightness, shortness of breath and stridor.   Cardiovascular: Negative.  Negative for chest pain, palpitations and leg swelling.  Gastrointestinal: Negative.  Negative for nausea, vomiting, abdominal pain, diarrhea and constipation.  Endocrine: Negative.   Genitourinary: Negative.  Negative for difficulty urinating.  Musculoskeletal: Positive for arthralgias. Negative for myalgias, back pain, joint swelling, gait problem and neck pain.  Skin: Negative.  Negative for color change and rash.  Allergic/Immunologic: Negative.   Neurological: Negative.  Negative for dizziness, tremors, weakness and light-headedness.  Hematological: Negative.  Negative for adenopathy. Does not bruise/bleed easily.  Psychiatric/Behavioral: Negative.     Objective:  BP 120/76 mmHg  Pulse 76  Temp(Src) 98 F (36.7 C) (Oral)  Resp 16  Ht 5\' 7"  (1.702 m)  Wt 215 lb (97.523 kg)  BMI 33.67 kg/m2  SpO2 96%  LMP 07/18/2012  BP Readings from Last 3 Encounters:  07/16/15 120/76  10/01/14 138/80  05/07/14 128/80    Wt Readings from Last 3 Encounters:  07/16/15 215 lb (97.523 kg)  10/01/14 216 lb (97.977 kg)  05/07/14 214 lb (97.07 kg)    Physical Exam  Constitutional: She is oriented to person, place, and time. No distress.  HENT:  Mouth/Throat: Oropharynx is clear and moist. No oropharyngeal exudate.  Eyes:  Conjunctivae are normal. Right eye exhibits no discharge. Left eye exhibits no discharge. No scleral icterus.  Neck: Normal range of motion. Neck supple. No JVD present. No tracheal deviation present. No thyromegaly present.  Cardiovascular: Normal rate, regular rhythm, normal heart sounds and intact distal pulses.  Exam reveals no gallop and no friction rub.   No murmur heard. Pulmonary/Chest: Effort normal and  breath sounds normal. No stridor. No respiratory distress. She has no wheezes. She has no rales. She exhibits no tenderness.  Abdominal: Soft. Bowel sounds are normal. She exhibits no distension and no mass. There is no tenderness. There is no rebound and no guarding.  Musculoskeletal: Normal range of motion. She exhibits no edema or tenderness.  Lymphadenopathy:    She has no cervical adenopathy.  Neurological: She is oriented to person, place, and time.  Skin: Skin is warm and dry. No rash noted. She is not diaphoretic. No erythema. No pallor.  Vitals reviewed.   Lab Results  Component Value Date   WBC 8.1 07/16/2015   HGB 12.3 07/16/2015   HCT 37.5 07/16/2015   PLT 189.0 07/16/2015   GLUCOSE 87 07/16/2015   CHOL 188 07/16/2015   TRIG 86.0 07/16/2015   HDL 44.10 07/16/2015   LDLDIRECT 167.9 11/09/2010   LDLCALC 127* 07/16/2015   ALT 14 07/16/2015   AST 16 07/16/2015   NA 139 07/16/2015   K 3.8 07/16/2015   CL 105 07/16/2015   CREATININE 0.75 07/16/2015   BUN 19 07/16/2015   CO2 25 07/16/2015   TSH 1.25 07/16/2015   HGBA1C 6.3 07/16/2015   MICROALBUR 1.1 07/16/2015    Mm Digital Screening Bilateral  05/13/2014  CLINICAL DATA:  Screening. EXAM: DIGITAL SCREENING BILATERAL MAMMOGRAM WITH CAD COMPARISON:  Previous exam(s). ACR Breast Density Category b: There are scattered areas of fibroglandular density. FINDINGS: There are no findings suspicious for malignancy. Images were processed with CAD. IMPRESSION: No mammographic evidence of malignancy. A result letter of this screening mammogram will be mailed directly to the patient. RECOMMENDATION: Screening mammogram in one year. (Code:SM-B-01Y) BI-RADS CATEGORY  1: Negative. Electronically Signed   By: Lillia Mountain M.D.   On: 05/13/2014 07:57    Assessment & Plan:   Edwinna was seen today for annual exam, hypertension, diabetes and osteoarthritis.  Diagnoses and all orders for this visit:  Type 2 diabetes mellitus with  complication, without long-term current use of insulin ([place])- her blood sugars are adequately well controlled, I have asked her to have her annual diabetic eye exam done. -     Hemoglobin A1c; Future -     Microalbumin / creatinine urine ratio; Future -     Ambulatory referral to Ophthalmology  Routine general medical examination at a health care facility- exam completed, labs ordered and reviewed, vaccines were reviewed, mammogram ordered, colonoscopy and Pap smear up-to-date, patient education material was given. -     Comprehensive metabolic panel; Future -     CBC with Differential/Platelet; Future -     Lipid panel; Future -     TSH; Future -     Urinalysis, Routine w reflex microscopic (not at [facility]); Future -     Hepatitis C antibody; Future -     HIV antibody; Future  Essential hypertension- her blood pressure is adequately well-controlled, electrolytes and renal function are stable.  Visit for screening mammogram -     MM DIGITAL SCREENING BILATERAL; Future  DJD (degenerative joint disease) of pelvis -     ibuprofen (  ADVIL,MOTRIN) 800 MG tablet; Take 1 tablet (800 mg total) by mouth every 8 (eight) hours as needed.   I have discontinued Ms. Goshert's multivitamin and indomethacin. I am also having her start on ibuprofen. Additionally, I am having her maintain her Calcium Carbonate-Vitamin D, losartan, atorvastatin, traMADol, and amLODipine.  Meds ordered this encounter  Medications  . ibuprofen (ADVIL,MOTRIN) 800 MG tablet    Sig: Take 1 tablet (800 mg total) by mouth every 8 (eight) hours as needed.    Dispense:  90 tablet    Refill:  5     Follow-up: Return in about 6 months (around 01/16/2016).  Scarlette Calico, MD

## 2015-07-16 NOTE — Progress Notes (Signed)
Pre visit review using our clinic review tool, if applicable. No additional management support is needed unless otherwise documented below in the visit note. 

## 2015-07-17 ENCOUNTER — Encounter: Payer: Self-pay | Admitting: Internal Medicine

## 2015-07-17 LAB — HIV ANTIBODY (ROUTINE TESTING W REFLEX): HIV: NONREACTIVE

## 2015-07-17 LAB — HEPATITIS C ANTIBODY: HCV Ab: NEGATIVE

## 2015-07-26 ENCOUNTER — Telehealth: Payer: Self-pay | Admitting: Internal Medicine

## 2015-07-26 NOTE — Telephone Encounter (Signed)
Did either of you call patient today?

## 2015-07-27 NOTE — Telephone Encounter (Signed)
[name] did - tried to call pt again - vm not set up

## 2015-07-30 ENCOUNTER — Encounter: Payer: Self-pay | Admitting: Internal Medicine

## 2015-09-20 LAB — HM DIABETES EYE EXAM

## 2015-09-27 ENCOUNTER — Encounter: Payer: Self-pay | Admitting: Internal Medicine

## 2015-12-06 ENCOUNTER — Other Ambulatory Visit: Payer: Self-pay | Admitting: *Deleted

## 2015-12-06 DIAGNOSIS — M1610 Unilateral primary osteoarthritis, unspecified hip: Secondary | ICD-10-CM

## 2015-12-06 DIAGNOSIS — E7849 Other hyperlipidemia: Secondary | ICD-10-CM

## 2015-12-06 DIAGNOSIS — E785 Hyperlipidemia, unspecified: Secondary | ICD-10-CM

## 2015-12-06 MED ORDER — IBUPROFEN 800 MG PO TABS: 800.0000 mg | ORAL_TABLET | Freq: Three times a day (TID) | ORAL | 0 refills | Status: DC | PRN

## 2015-12-06 MED ORDER — ATORVASTATIN CALCIUM 40 MG PO TABS: 40.0000 mg | ORAL_TABLET | Freq: Every day | ORAL | 0 refills | Status: DC

## 2015-12-06 MED ORDER — AMLODIPINE BESYLATE 10 MG PO TABS: 10.0000 mg | ORAL_TABLET | Freq: Every day | ORAL | 0 refills | Status: DC

## 2015-12-06 NOTE — Telephone Encounter (Signed)
Left msg on triage requesting refill on her BP med & cholesterol medication. Pt is due for 6 month f/u in January sent 30 day supply until apt.Marland KitchenJohny Chess

## 2016-01-02 ENCOUNTER — Other Ambulatory Visit: Payer: Self-pay | Admitting: Internal Medicine

## 2016-01-02 DIAGNOSIS — E7849 Other hyperlipidemia: Secondary | ICD-10-CM

## 2016-01-02 DIAGNOSIS — E785 Hyperlipidemia, unspecified: Secondary | ICD-10-CM

## 2016-01-10 ENCOUNTER — Ambulatory Visit (INDEPENDENT_AMBULATORY_CARE_PROVIDER_SITE_OTHER): Payer: Managed Care, Other (non HMO) | Admitting: Internal Medicine

## 2016-01-10 ENCOUNTER — Encounter: Payer: Self-pay | Admitting: Internal Medicine

## 2016-01-10 ENCOUNTER — Other Ambulatory Visit (INDEPENDENT_AMBULATORY_CARE_PROVIDER_SITE_OTHER): Payer: Managed Care, Other (non HMO)

## 2016-01-10 VITALS — BP 144/82 | HR 75 | Temp 98.0°F | Resp 16 | Ht 67.0 in | Wt 217.5 lb

## 2016-01-10 DIAGNOSIS — E118 Type 2 diabetes mellitus with unspecified complications: Secondary | ICD-10-CM

## 2016-01-10 DIAGNOSIS — M5441 Lumbago with sciatica, right side: Secondary | ICD-10-CM | POA: Diagnosis not present

## 2016-01-10 DIAGNOSIS — E785 Hyperlipidemia, unspecified: Secondary | ICD-10-CM

## 2016-01-10 DIAGNOSIS — M545 Low back pain, unspecified: Secondary | ICD-10-CM | POA: Insufficient documentation

## 2016-01-10 DIAGNOSIS — Z23 Encounter for immunization: Secondary | ICD-10-CM | POA: Diagnosis not present

## 2016-01-10 DIAGNOSIS — M1610 Unilateral primary osteoarthritis, unspecified hip: Secondary | ICD-10-CM

## 2016-01-10 DIAGNOSIS — E7849 Other hyperlipidemia: Secondary | ICD-10-CM

## 2016-01-10 DIAGNOSIS — I10 Essential (primary) hypertension: Secondary | ICD-10-CM

## 2016-01-10 DIAGNOSIS — G8929 Other chronic pain: Secondary | ICD-10-CM | POA: Insufficient documentation

## 2016-01-10 LAB — LIPID PANEL
CHOL/HDL RATIO: 3
Cholesterol: 179 mg/dL (ref 0–200)
HDL: 51.8 mg/dL (ref >39.00)
LDL CALC: 111 mg/dL — AB (ref 0–99)
NONHDL: 127.35
Triglycerides: 80 mg/dL (ref 0.0–149.0)
VLDL: 16 mg/dL (ref 0.0–40.0)

## 2016-01-10 LAB — COMPREHENSIVE METABOLIC PANEL
ALT: 17 U/L (ref 0–35)
AST: 16 U/L (ref 0–37)
Albumin: 4.2 g/dL (ref 3.5–5.2)
Alkaline Phosphatase: 67 U/L (ref 39–117)
BUN: 21 mg/dL (ref 6–23)
CHLORIDE: 108 meq/L (ref 96–112)
CO2: 24 meq/L (ref 19–32)
CREATININE: 0.69 mg/dL (ref 0.40–1.20)
Calcium: 9.5 mg/dL (ref 8.4–10.5)
GFR: 112.08 mL/min (ref >60.00)
GLUCOSE: 93 mg/dL (ref 70–99)
POTASSIUM: 4 meq/L (ref 3.5–5.1)
SODIUM: 142 meq/L (ref 135–145)
Total Bilirubin: 0.4 mg/dL (ref 0.2–1.2)
Total Protein: 7.9 g/dL (ref 6.0–8.3)

## 2016-01-10 LAB — HEMOGLOBIN A1C: Hgb A1c MFr Bld: 6.4 % (ref 4.6–6.5)

## 2016-01-10 MED ORDER — TELMISARTAN 80 MG PO TABS: 80.0000 mg | ORAL_TABLET | Freq: Every day | ORAL | 1 refills | Status: DC

## 2016-01-10 MED ORDER — AMLODIPINE BESYLATE 10 MG PO TABS: ORAL_TABLET | ORAL | 1 refills | Status: DC

## 2016-01-10 MED ORDER — IBUPROFEN 800 MG PO TABS: 800.0000 mg | ORAL_TABLET | Freq: Three times a day (TID) | ORAL | 3 refills | Status: DC | PRN

## 2016-01-10 MED ORDER — ATORVASTATIN CALCIUM 40 MG PO TABS: ORAL_TABLET | ORAL | 1 refills | Status: DC

## 2016-01-10 NOTE — Progress Notes (Signed)
Subjective:  Patient ID: [name], female    DOB: [birth year]  Age: 61 y.o. MRN: [id number]  CC: Back Pain; Hypertension; Diabetes; and Hyperlipidemia   HPI Fusae L Deniston presents for follow-up on the above medical problems. She complains of right-sided lower back pain worsening over the last 2-3 beers. She tells me in the last year she has seen an orthopedic surgeon and he told her her back was normal and that an MRI was unremarkable. She continues to have episodes of pain that radiates into her right thigh. She takes ibuprofen for symptom relief. She denies episodes of numbness, weakness, tingling.  She feels like her blood pressure is well-controlled on amlodipine and losartan. She's had no recent episodes of headache/blurred vision/chest pain/shortness of breath/palpitations/edema/fatigue.  She is doing well on atorvastatin with no muscle or joint aches.  Outpatient Medications Prior to Visit  Medication Sig Dispense Refill  . Calcium Carbonate-Vitamin D (CALTRATE 600+D) 600-400 MG-UNIT per tablet Take 1 tablet by mouth daily.     Marland Kitchen amLODipine (NORVASC) 10 MG tablet TAKE 1 TABLET BY MOUTH EVERY DAY (DUE FOR FOLLOW UP) 30 tablet 0  . atorvastatin (LIPITOR) 40 MG tablet TAKE 1 TABLET BY MOUTH EVERY DAY (FOLOOW UP DUE) 30 tablet 0  . ibuprofen (ADVIL,MOTRIN) 800 MG tablet Take 1 tablet (800 mg total) by mouth every 8 (eight) hours as needed. Due for follow-up appt in January must see Md for refills 30 tablet 0  . losartan (COZAAR) 100 MG tablet Take 1 tablet (100 mg total) by mouth daily. 90 tablet 3  . traMADol (ULTRAM) 50 MG tablet Take 1 tablet (50 mg total) by mouth every 8 (eight) hours as needed. 65 tablet 2   No facility-administered medications prior to visit.     ROS Review of Systems  Constitutional: Negative for appetite change, diaphoresis, fatigue and unexpected weight change.  HENT: Negative.   Eyes: Negative for visual disturbance.  Respiratory: Negative for  cough, chest tightness, shortness of breath, wheezing and stridor.   Cardiovascular: Negative for chest pain, palpitations and leg swelling.  Gastrointestinal: Negative for abdominal pain, blood in stool, constipation, diarrhea, nausea and vomiting.  Endocrine: Negative.   Genitourinary: Negative.  Negative for decreased urine volume, difficulty urinating, dysuria, hematuria and urgency.  Musculoskeletal: Positive for back pain. Negative for arthralgias, joint swelling, myalgias and neck pain.  Skin: Negative.  Negative for color change and rash.  Allergic/Immunologic: Negative.   Neurological: Negative.  Negative for dizziness, weakness and numbness.  Hematological: Negative.  Negative for adenopathy. Does not bruise/bleed easily.  Psychiatric/Behavioral: Negative.     Objective:  BP (!) 144/82 (BP Location: Right Arm, Patient Position: Sitting, Cuff Size: Large)   Pulse 75   Temp 98 F (36.7 C) (Oral)   Resp 16   Ht 5\' 7"  (1.702 m)   Wt 217 lb 8 oz (98.7 kg)   LMP 07/18/2012   SpO2 98%   BMI 34.07 kg/m   BP Readings from Last 3 Encounters:  01/10/16 (!) 144/82  07/16/15 120/76  10/01/14 138/80    Wt Readings from Last 3 Encounters:  01/10/16 217 lb 8 oz (98.7 kg)  07/16/15 215 lb (97.5 kg)  10/01/14 216 lb (98 kg)    Physical Exam  Constitutional: She is oriented to person, place, and time. No distress.  HENT:  Mouth/Throat: Oropharynx is clear and moist. No oropharyngeal exudate.  Eyes: Conjunctivae are normal. Right eye exhibits no discharge. Left eye exhibits no discharge. No  scleral icterus.  Neck: Normal range of motion. Neck supple. No JVD present. No tracheal deviation present. No thyromegaly present.  Cardiovascular: Normal rate, regular rhythm, normal heart sounds and intact distal pulses.  Exam reveals no gallop and no friction rub.   No murmur heard. Pulmonary/Chest: Effort normal and breath sounds normal. No stridor. No respiratory distress. She has no  wheezes. She has no rales. She exhibits no tenderness.  Abdominal: Soft. Bowel sounds are normal. She exhibits no distension and no mass. There is no tenderness. There is no rebound and no guarding.  Musculoskeletal: Normal range of motion. She exhibits no edema, tenderness or deformity.  Lymphadenopathy:    She has no cervical adenopathy.  Neurological: She is alert and oriented to person, place, and time. She displays no atrophy, no tremor and normal reflexes. No cranial nerve deficit or sensory deficit. She exhibits normal muscle tone. She displays no seizure activity. Coordination and gait normal.  Reflex Scores:      Tricep reflexes are 0 on the right side and 0 on the left side.      Bicep reflexes are 0 on the right side and 0 on the left side.      Brachioradialis reflexes are 0 on the right side and 0 on the left side.      Patellar reflexes are 0 on the right side and 0 on the left side.      Achilles reflexes are 0 on the right side and 0 on the left side. + SLR in RLE - SLR in LLE  Skin: Skin is warm and dry. No rash noted. She is not diaphoretic. No erythema. No pallor.  Vitals reviewed.   Lab Results  Component Value Date   WBC 8.1 07/16/2015   HGB 12.3 07/16/2015   HCT 37.5 07/16/2015   PLT 189.0 07/16/2015   GLUCOSE 93 01/10/2016   CHOL 179 01/10/2016   TRIG 80.0 01/10/2016   HDL 51.80 01/10/2016   LDLDIRECT 167.9 11/09/2010   LDLCALC 111 (H) 01/10/2016   ALT 17 01/10/2016   AST 16 01/10/2016   NA 142 01/10/2016   K 4.0 01/10/2016   CL 108 01/10/2016   CREATININE 0.69 01/10/2016   BUN 21 01/10/2016   CO2 24 01/10/2016   TSH 1.25 07/16/2015   HGBA1C 6.4 01/10/2016   MICROALBUR 1.1 07/16/2015    Mm Digital Screening Bilateral  Result Date: 05/13/2014 CLINICAL DATA:  Screening. EXAM: DIGITAL SCREENING BILATERAL MAMMOGRAM WITH CAD COMPARISON:  Previous exam(s). ACR Breast Density Category b: There are scattered areas of fibroglandular density. FINDINGS: There  are no findings suspicious for malignancy. Images were processed with CAD. IMPRESSION: No mammographic evidence of malignancy. A result letter of this screening mammogram will be mailed directly to the patient. RECOMMENDATION: Screening mammogram in one year. (Code:SM-B-01Y) BI-RADS CATEGORY  1: Negative. Electronically Signed   By: Lillia Mountain M.D.   On: 05/13/2014 07:57    Assessment & Plan:   Oberia was seen today for back pain, hypertension, diabetes and hyperlipidemia.  Diagnoses and all orders for this visit:  Essential hypertension- her blood pressure is not quite adequately well controlled so I have asked to continue the CCB but I will upgrade her to a more potent ARB, her electrolytes and kidney function are normal -     Comprehensive metabolic panel; Future -     amLODipine (NORVASC) 10 MG tablet; TAKE 1 TABLET BY MOUTH EVERY DAY (DUE FOR FOLLOW UP) -  telmisartan (MICARDIS) 80 MG tablet; Take 1 tablet (80 mg total) by mouth daily.  Type 2 diabetes mellitus with complication, without long-term current use of insulin ([place])- her A1c is a 6.4%, her blood sugars are adequately well-controlled. -     Comprehensive metabolic panel; Future -     Hemoglobin A1c; Future -     telmisartan (MICARDIS) 80 MG tablet; Take 1 tablet (80 mg total) by mouth daily. -     atorvastatin (LIPITOR) 40 MG tablet; TAKE 1 TABLET BY MOUTH EVERY DAY (FOLOOW UP DUE)  Hyperlipidemia with target LDL less than 130- she has achieved her LDL goal and is doing well on the statin. -     Lipid panel; Future -     atorvastatin (LIPITOR) 40 MG tablet; TAKE 1 TABLET BY MOUTH EVERY DAY (FOLOOW UP DUE)  Need for prophylactic vaccination and inoculation against influenza -     Flu Vaccine QUAD 36+ mos IM  Chronic right-sided low back pain with right-sided sciatica- will continue ibuprofen as needed for pain, I've also asked her to see pain management for further evaluation and treatment options. -     Ambulatory  referral to Pain Clinic -     ibuprofen (ADVIL,MOTRIN) 800 MG tablet; Take 1 tablet (800 mg total) by mouth every 8 (eight) hours as needed for moderate pain.  Other hyperlipidemia  DJD (degenerative joint disease) of pelvis -     ibuprofen (ADVIL,MOTRIN) 800 MG tablet; Take 1 tablet (800 mg total) by mouth every 8 (eight) hours as needed for moderate pain.   I have discontinued Ms. Keelan's losartan and traMADol. I have also changed her ibuprofen. Additionally, I am having her start on telmisartan. Lastly, I am having her maintain her Calcium Carbonate-Vitamin D, amLODipine, and atorvastatin.  Meds ordered this encounter  Medications  . amLODipine (NORVASC) 10 MG tablet    Sig: TAKE 1 TABLET BY MOUTH EVERY DAY (DUE FOR FOLLOW UP)    Dispense:  90 tablet    Refill:  1  . telmisartan (MICARDIS) 80 MG tablet    Sig: Take 1 tablet (80 mg total) by mouth daily.    Dispense:  90 tablet    Refill:  1  . atorvastatin (LIPITOR) 40 MG tablet    Sig: TAKE 1 TABLET BY MOUTH EVERY DAY (FOLOOW UP DUE)    Dispense:  90 tablet    Refill:  1  . ibuprofen (ADVIL,MOTRIN) 800 MG tablet    Sig: Take 1 tablet (800 mg total) by mouth every 8 (eight) hours as needed for moderate pain.    Dispense:  90 tablet    Refill:  3     Follow-up: Return in about 6 months (around 07/09/2016).  Scarlette Calico, MD

## 2016-01-10 NOTE — Progress Notes (Signed)
Pre visit review using our clinic review tool, if applicable. No additional management support is needed unless otherwise documented below in the visit note. 

## 2016-01-10 NOTE — Patient Instructions (Signed)
Hypertension Hypertension, commonly called high blood pressure, is when the force of blood pumping through your arteries is too strong. Your arteries are the blood vessels that carry blood from your heart throughout your body. A blood pressure reading consists of a higher number over a lower number, such as 110/72. The higher number (systolic) is the pressure inside your arteries when your heart pumps. The lower number (diastolic) is the pressure inside your arteries when your heart relaxes. Ideally you want your blood pressure below 120/80. Hypertension forces your heart to work harder to pump blood. Your arteries may become narrow or stiff. Having untreated or uncontrolled hypertension can cause heart attack, stroke, kidney disease, and other problems. What increases the risk? Some risk factors for high blood pressure are controllable. Others are not. Risk factors you cannot control include:  Race. You may be at higher risk if you are African American.  Age. Risk increases with age.  Gender. Men are at higher risk than women before age 45 years. After age 65, women are at higher risk than men. Risk factors you can control include:  Not getting enough exercise or physical activity.  Being overweight.  Getting too much fat, sugar, calories, or salt in your diet.  Drinking too much alcohol. What are the signs or symptoms? Hypertension does not usually cause signs or symptoms. Extremely high blood pressure (hypertensive crisis) may cause headache, anxiety, shortness of breath, and nosebleed. How is this diagnosed? To check if you have hypertension, your health care provider will measure your blood pressure while you are seated, with your arm held at the level of your heart. It should be measured at least twice using the same arm. Certain conditions can cause a difference in blood pressure between your right and left arms. A blood pressure reading that is higher than normal on one occasion does  not mean that you need treatment. If it is not clear whether you have high blood pressure, you may be asked to return on a different day to have your blood pressure checked again. Or, you may be asked to monitor your blood pressure at home for 1 or more weeks. How is this treated? Treating high blood pressure includes making lifestyle changes and possibly taking medicine. Living a healthy lifestyle can help lower high blood pressure. You may need to change some of your habits. Lifestyle changes may include:  Following the DASH diet. This diet is high in fruits, vegetables, and whole grains. It is low in salt, red meat, and added sugars.  Keep your sodium intake below 2,300 mg per day.  Getting at least 30-45 minutes of aerobic exercise at least 4 times per week.  Losing weight if necessary.  Not smoking.  Limiting alcoholic beverages.  Learning ways to reduce stress. Your health care provider may prescribe medicine if lifestyle changes are not enough to get your blood pressure under control, and if one of the following is true:  You are 18-59 years of age and your systolic blood pressure is above 140.  You are 60 years of age or older, and your systolic blood pressure is above 150.  Your diastolic blood pressure is above 90.  You have diabetes, and your systolic blood pressure is over 140 or your diastolic blood pressure is over 90.  You have kidney disease and your blood pressure is above 140/90.  You have heart disease and your blood pressure is above 140/90. Your personal target blood pressure may vary depending on your medical   conditions, your age, and other factors. Follow these instructions at home:  Have your blood pressure rechecked as directed by your health care provider.  Take medicines only as directed by your health care provider. Follow the directions carefully. Blood pressure medicines must be taken as prescribed. The medicine does not work as well when you skip  doses. Skipping doses also puts you at risk for problems.  Do not smoke.  Monitor your blood pressure at home as directed by your health care provider. Contact a health care provider if:  You think you are having a reaction to medicines taken.  You have recurrent headaches or feel dizzy.  You have swelling in your ankles.  You have trouble with your vision. Get help right away if:  You develop a severe headache or confusion.  You have unusual weakness, numbness, or feel faint.  You have severe chest or abdominal pain.  You vomit repeatedly.  You have trouble breathing. This information is not intended to replace advice given to you by your health care provider. Make sure you discuss any questions you have with your health care provider. Document Released: 12/19/2004 Document Revised: 05/27/2015 Document Reviewed: 10/11/2012 Elsevier Interactive Patient Education  2017 Elsevier Inc.  

## 2016-01-22 IMAGING — CR DG CHEST 2V
2 series · 2 of 2 positions shown · non-contrast
Comparison: 11/09/2010.

CLINICAL DATA: Other diseases of the lungs. Hypertension, diabetes.

EXAM:
CHEST  2 VIEW

[view not recorded (1 of 2)]
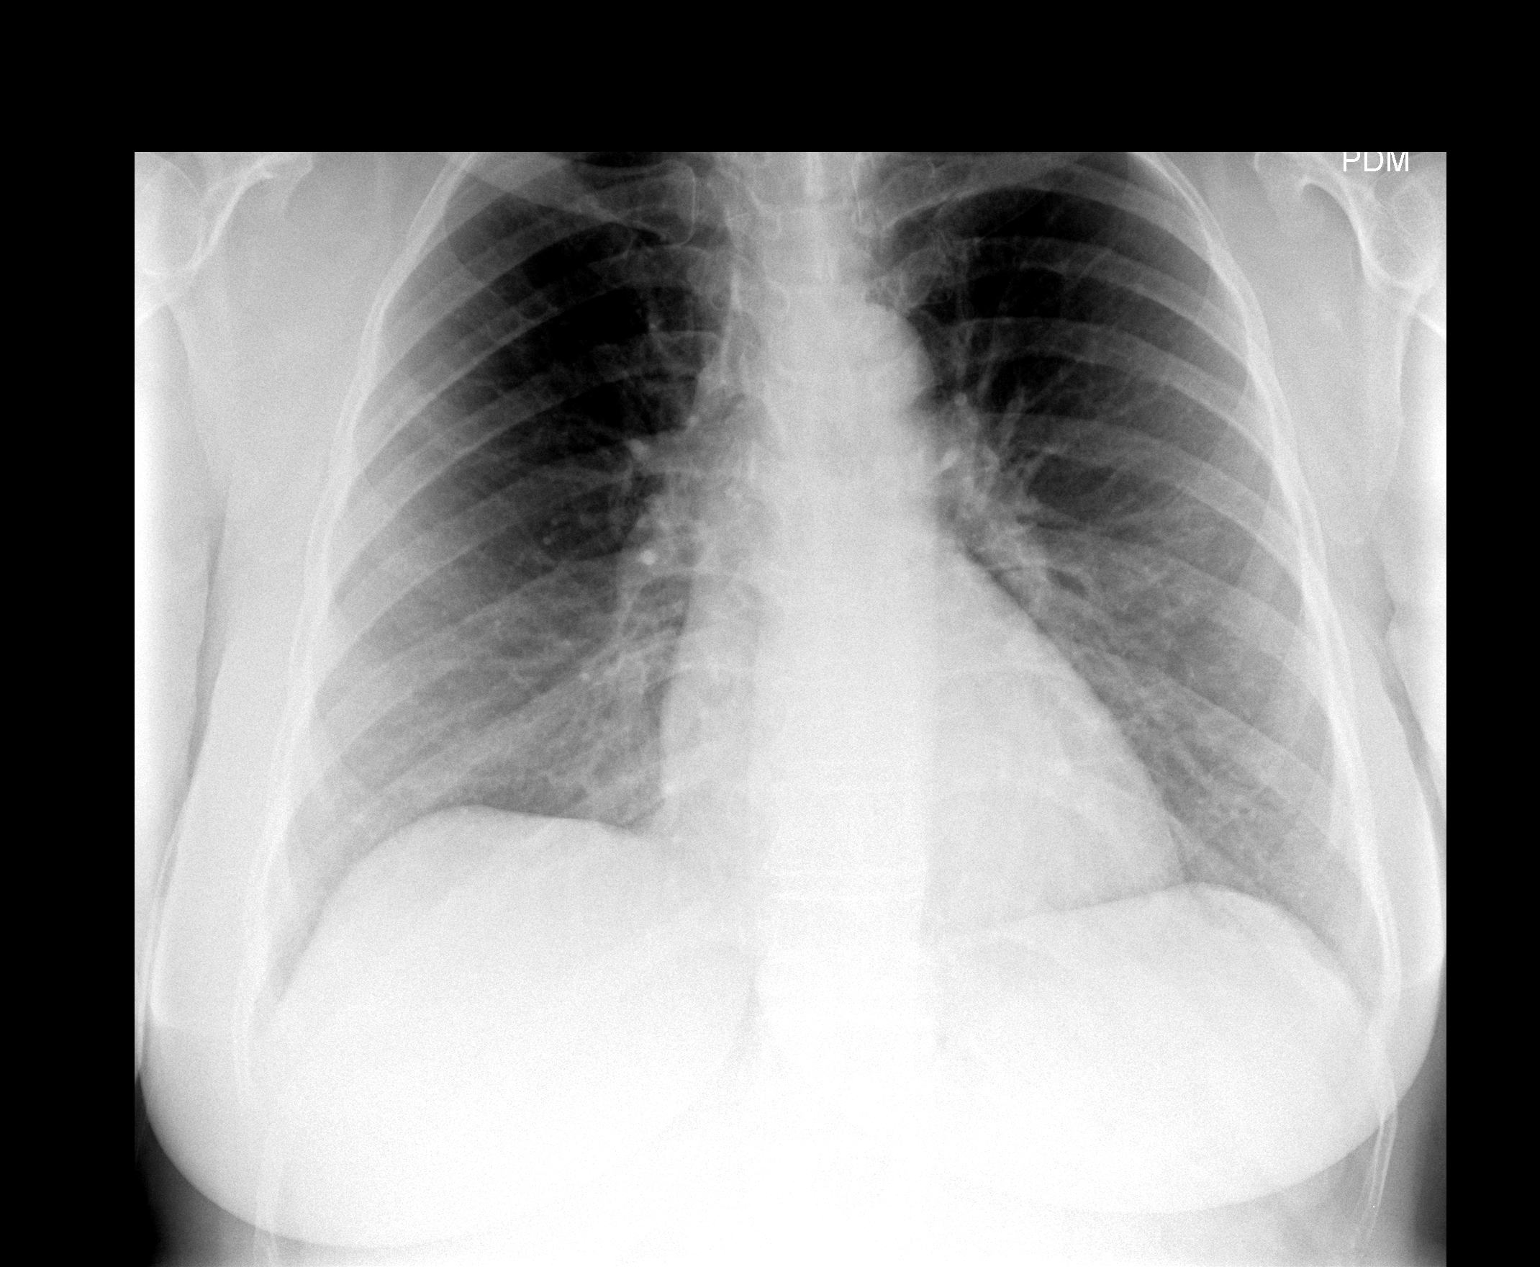

[view not recorded (2 of 2)]
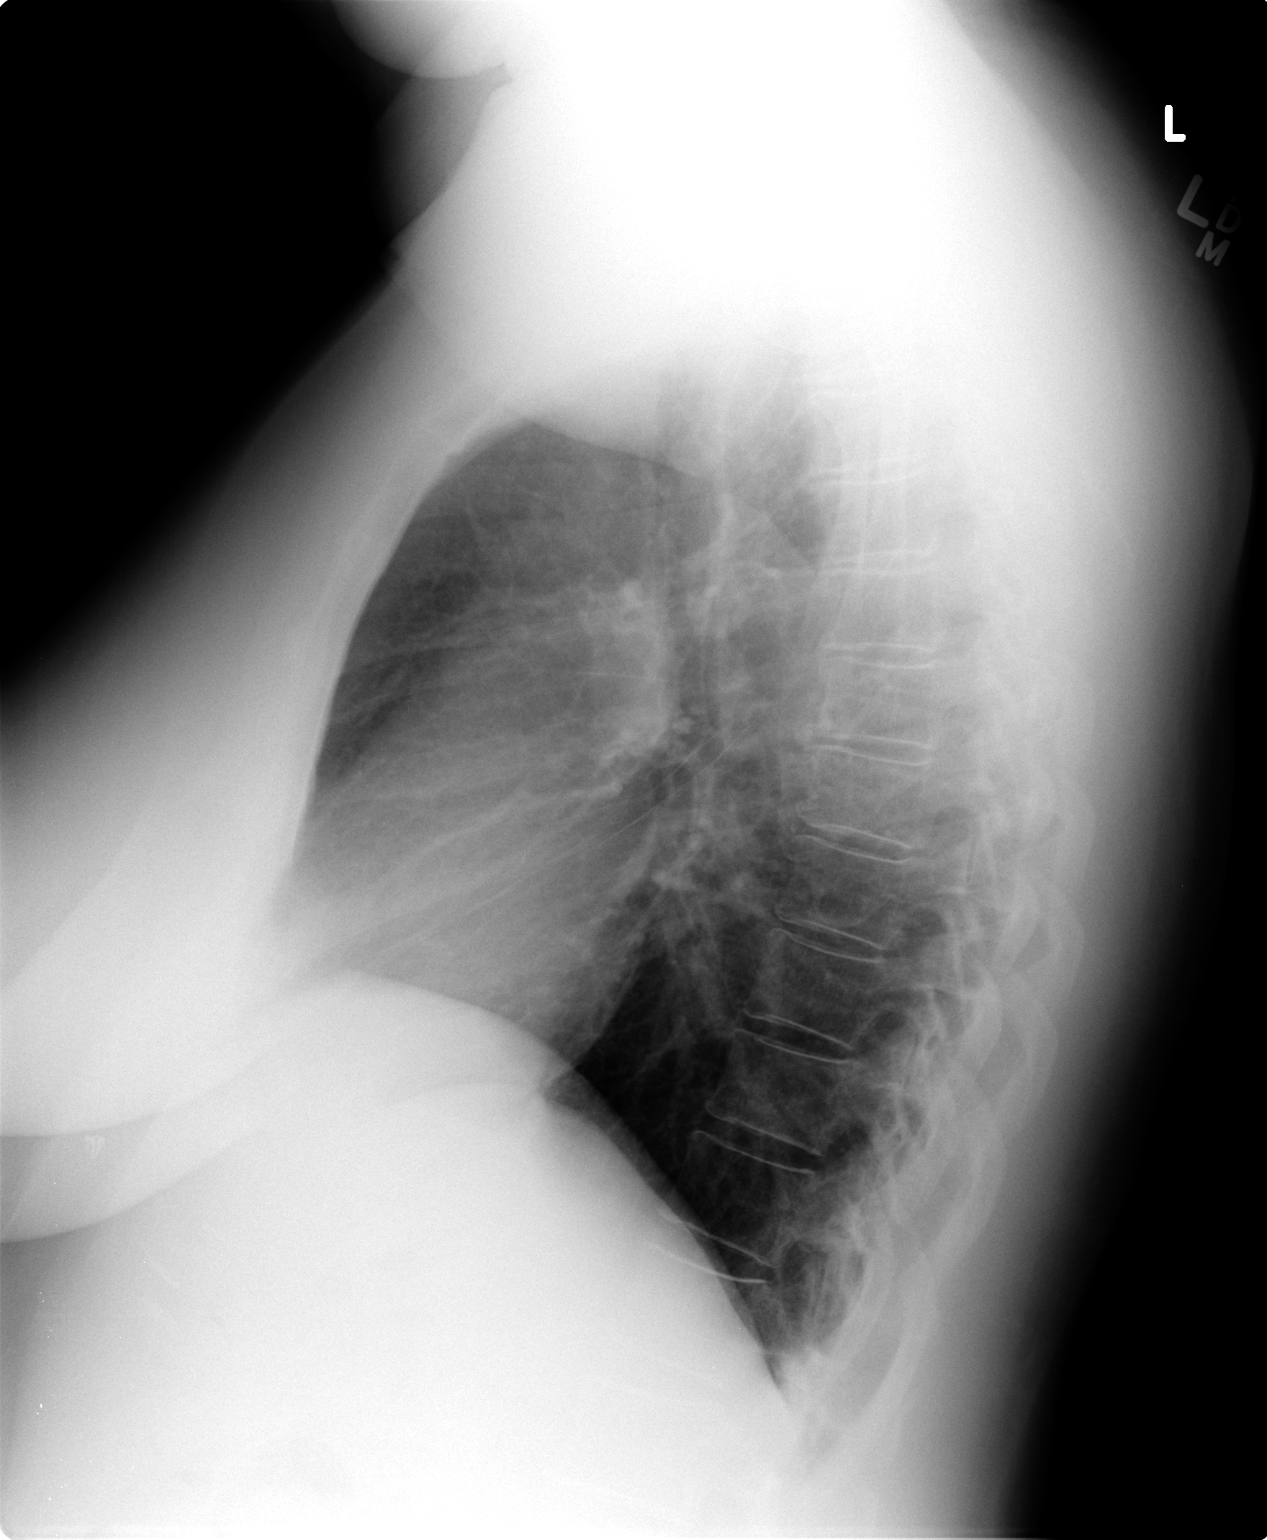

[2 of 2 positions shown; findings below may reference images not displayed]

FINDINGS: The heart size and mediastinal contours are within normal limits.
Both lungs are clear. The visualized skeletal structures are
unremarkable.
IMPRESSION: No active cardiopulmonary disease.

## 2016-02-04 ENCOUNTER — Other Ambulatory Visit: Payer: Self-pay | Admitting: *Deleted

## 2016-02-04 DIAGNOSIS — E785 Hyperlipidemia, unspecified: Secondary | ICD-10-CM

## 2016-02-04 DIAGNOSIS — E118 Type 2 diabetes mellitus with unspecified complications: Secondary | ICD-10-CM

## 2016-02-04 MED ORDER — ATORVASTATIN CALCIUM 40 MG PO TABS
ORAL_TABLET | ORAL | 1 refills | Status: DC
Start: 2016-02-04 — End: 2016-08-03

## 2016-03-28 ENCOUNTER — Encounter: Payer: Self-pay | Admitting: Internal Medicine

## 2016-05-17 LAB — HM MAMMOGRAPHY: HM Mammogram: NORMAL (ref 0–4)

## 2016-08-01 ENCOUNTER — Encounter: Payer: Self-pay | Admitting: Internal Medicine

## 2016-08-01 ENCOUNTER — Other Ambulatory Visit (INDEPENDENT_AMBULATORY_CARE_PROVIDER_SITE_OTHER): Payer: Managed Care, Other (non HMO)

## 2016-08-01 ENCOUNTER — Ambulatory Visit (INDEPENDENT_AMBULATORY_CARE_PROVIDER_SITE_OTHER): Payer: Managed Care, Other (non HMO) | Admitting: Internal Medicine

## 2016-08-01 VITALS — BP 144/92 | HR 85 | Temp 99.0°F | Resp 16 | Ht 67.0 in | Wt 215.8 lb

## 2016-08-01 DIAGNOSIS — R200 Anesthesia of skin: Secondary | ICD-10-CM

## 2016-08-01 DIAGNOSIS — E118 Type 2 diabetes mellitus with unspecified complications: Secondary | ICD-10-CM

## 2016-08-01 DIAGNOSIS — E785 Hyperlipidemia, unspecified: Secondary | ICD-10-CM | POA: Diagnosis not present

## 2016-08-01 DIAGNOSIS — I10 Essential (primary) hypertension: Secondary | ICD-10-CM

## 2016-08-01 LAB — BASIC METABOLIC PANEL
BUN: 15 mg/dL (ref 6–23)
CALCIUM: 9.2 mg/dL (ref 8.4–10.5)
CO2: 25 mEq/L (ref 19–32)
CREATININE: 0.7 mg/dL (ref 0.40–1.20)
Chloride: 107 mEq/L (ref 96–112)
GFR: 110.02 mL/min (ref >60.00)
Glucose, Bld: 100 mg/dL — ABNORMAL HIGH (ref 70–99)
Potassium: 3.7 mEq/L (ref 3.5–5.1)
SODIUM: 140 meq/L (ref 135–145)

## 2016-08-01 LAB — MICROALBUMIN / CREATININE URINE RATIO
CREATININE, U: 207.1 mg/dL
MICROALB UR: 2.4 mg/dL — AB (ref 0.0–1.9)
MICROALB/CREAT RATIO: 1.2 mg/g (ref 0.0–30.0)

## 2016-08-01 LAB — LIPID PANEL
CHOLESTEROL: 184 mg/dL (ref 0–200)
HDL: 45.9 mg/dL (ref >39.00)
LDL Cholesterol: 123 mg/dL — ABNORMAL HIGH (ref 0–99)
NonHDL: 138.38
TRIGLYCERIDES: 79 mg/dL (ref 0.0–149.0)
Total CHOL/HDL Ratio: 4
VLDL: 15.8 mg/dL (ref 0.0–40.0)

## 2016-08-01 LAB — VITAMIN B12: VITAMIN B 12: 532 pg/mL (ref 211–911)

## 2016-08-01 LAB — HEMOGLOBIN A1C: Hgb A1c MFr Bld: 6.2 % (ref 4.6–6.5)

## 2016-08-01 MED ORDER — TELMISARTAN 80 MG PO TABS: 80.0000 mg | ORAL_TABLET | Freq: Every day | ORAL | 1 refills | Status: DC

## 2016-08-01 MED ORDER — AMLODIPINE BESYLATE 10 MG PO TABS: ORAL_TABLET | ORAL | 1 refills | Status: DC

## 2016-08-01 NOTE — Progress Notes (Signed)
Subjective:  Patient ID: [name], female    DOB: July 29, 1955  Age: 61 y.o. MRN: [id number]  CC: Hyperlipidemia; Hypertension; and Diabetes   HPI [name] presents for f/up - She complains of chronic, worsening numbness in both feet. She has hip pain and tells me that she may have surgery on her right hip soon. She has been out of her antihypertensives for about a month and has therefore not had very good blood pressure control. She's had a few achy headaches but denies nausea or vomiting.  Outpatient Medications Prior to Visit  Medication Sig Dispense Refill  . Calcium Carbonate-Vitamin D (CALTRATE 600+D) 600-400 MG-UNIT per tablet Take 1 tablet by mouth daily.     Marland Kitchen ibuprofen (ADVIL,MOTRIN) 800 MG tablet Take 1 tablet (800 mg total) by mouth every 8 (eight) hours as needed for moderate pain. 90 tablet 3  . amLODipine (NORVASC) 10 MG tablet TAKE 1 TABLET BY MOUTH EVERY DAY (DUE FOR FOLLOW UP) 90 tablet 1  . atorvastatin (LIPITOR) 40 MG tablet TAKE 1 TABLET BY MOUTH EVERY DAY (FOLOOW UP DUE) 90 tablet 1  . telmisartan (MICARDIS) 80 MG tablet Take 1 tablet (80 mg total) by mouth daily. 90 tablet 1   No facility-administered medications prior to visit.     ROS Review of Systems  Constitutional: Negative for appetite change, chills, diaphoresis, fatigue and fever.  HENT: Negative.  Negative for trouble swallowing.   Eyes: Negative for visual disturbance.  Respiratory: Negative for cough, chest tightness, shortness of breath and wheezing.   Cardiovascular: Negative for chest pain, palpitations and leg swelling.  Gastrointestinal: Negative for abdominal pain, constipation, diarrhea, nausea and vomiting.  Endocrine: Negative.   Genitourinary: Negative.  Negative for decreased urine volume, difficulty urinating, dysuria, hematuria and urgency.  Musculoskeletal: Positive for arthralgias. Negative for back pain, myalgias and neck pain.  Skin: Negative.   Allergic/Immunologic:  Negative.   Neurological: Positive for numbness and headaches. Negative for dizziness, weakness and light-headedness.  Hematological: Negative for adenopathy. Does not bruise/bleed easily.  Psychiatric/Behavioral: Negative.  Negative for decreased concentration, dysphoric mood, sleep disturbance and suicidal ideas.    Objective:  BP (!) 144/92 (BP Location: Left Arm, Patient Position: Sitting, Cuff Size: Large)   Pulse 85   Temp 99 F (37.2 C) (Oral)   Resp 16   Ht 5\' 7"  (1.702 m)   Wt 215 lb 13 oz (97.9 kg)   LMP 07/18/2012   SpO2 100%   BMI 33.80 kg/m   BP Readings from Last 3 Encounters:  08/01/16 (!) 144/92  01/10/16 (!) 144/82  07/16/15 120/76    Wt Readings from Last 3 Encounters:  08/01/16 215 lb 13 oz (97.9 kg)  01/10/16 217 lb 8 oz (98.7 kg)  07/16/15 215 lb (97.5 kg)    Physical Exam  Constitutional: She is oriented to person, place, and time. No distress.  HENT:  Mouth/Throat: Oropharynx is clear and moist. No oropharyngeal exudate.  Eyes: Conjunctivae are normal. Right eye exhibits no discharge. Left eye exhibits no discharge. No scleral icterus.  Neck: Normal range of motion. Neck supple. No JVD present. No thyromegaly present.  Cardiovascular: Normal rate, regular rhythm and intact distal pulses.  Exam reveals no gallop and no friction rub.   No murmur heard. EKG -- Sinus  Rhythm  WITHIN NORMAL LIMITS- no change from prior EKG  Pulmonary/Chest: Effort normal and breath sounds normal. No respiratory distress. She has no wheezes. She has no rales. She exhibits no  tenderness.  Abdominal: Bowel sounds are normal. She exhibits no distension and no mass. There is no tenderness. There is no rebound and no guarding.  Musculoskeletal: Normal range of motion. She exhibits no edema, tenderness or deformity.  Lymphadenopathy:    She has no cervical adenopathy.  Neurological: She is alert and oriented to person, place, and time. She displays no atrophy, no tremor and  normal reflexes. She exhibits normal muscle tone. She displays a negative Romberg sign. She displays no seizure activity. Coordination and gait normal. She displays no Babinski's sign on the right side. She displays no Babinski's sign on the left side.  Reflex Scores:      Tricep reflexes are 1+ on the right side and 1+ on the left side.      Bicep reflexes are 1+ on the right side and 1+ on the left side.      Brachioradialis reflexes are 1+ on the right side and 1+ on the left side.      Patellar reflexes are 1+ on the right side and 1+ on the left side.      Achilles reflexes are 0 on the right side and 0 on the left side. Skin: Skin is warm and dry. No rash noted. She is not diaphoretic. No erythema. No pallor.  Vitals reviewed.   Lab Results  Component Value Date   WBC 8.1 07/16/2015   HGB 12.3 07/16/2015   HCT 37.5 07/16/2015   PLT 189.0 07/16/2015   GLUCOSE 100 (H) 08/01/2016   CHOL 184 08/01/2016   TRIG 79.0 08/01/2016   HDL 45.90 08/01/2016   LDLDIRECT 167.9 11/09/2010   LDLCALC 123 (H) 08/01/2016   ALT 17 01/10/2016   AST 16 01/10/2016   NA 140 08/01/2016   K 3.7 08/01/2016   CL 107 08/01/2016   CREATININE 0.70 08/01/2016   BUN 15 08/01/2016   CO2 25 08/01/2016   TSH 1.25 07/16/2015   HGBA1C 6.2 08/01/2016   MICROALBUR 2.4 (H) 08/01/2016    Mm Digital Screening Bilateral  Result Date: 05/12/2014 CLINICAL DATA:  Screening. EXAM: DIGITAL SCREENING BILATERAL MAMMOGRAM WITH CAD COMPARISON:  Previous exam(s). ACR Breast Density Category b: There are scattered areas of fibroglandular density. FINDINGS: There are no findings suspicious for malignancy. Images were processed with CAD. IMPRESSION: No mammographic evidence of malignancy. A result letter of this screening mammogram will be mailed directly to the patient. RECOMMENDATION: Screening mammogram in one year. (Code:SM-B-01Y) BI-RADS CATEGORY  1: Negative. Electronically Signed   By: Lillia Mountain M.D.   On: 05/13/2014  07:57    Assessment & Plan:   Ashlan was seen today for hyperlipidemia, hypertension and diabetes.  Diagnoses and all orders for this visit:  Essential hypertension- her blood pressure is not adequately well controlled. Her electrolytes and renal function are negative for any secondary causes or end organ damage. Will restart amlodipine and telmisartan. -     Basic metabolic panel; Future -     amLODipine (NORVASC) 10 MG tablet; TAKE 1 TABLET BY MOUTH EVERY DAY (DUE FOR FOLLOW UP) -     telmisartan (MICARDIS) 80 MG tablet; Take 1 tablet (80 mg total) by mouth daily. -     EKG 12-Lead  Type 2 diabetes mellitus with complication, without long-term current use of insulin ([place])- her A1c was 6.2%. Her blood sugars are very well controlled. Medical therapy is not indicated. -     Hemoglobin A1c; Future -     Microalbumin / creatinine urine ratio;  Future -     telmisartan (MICARDIS) 80 MG tablet; Take 1 tablet (80 mg total) by mouth daily. -     atorvastatin (LIPITOR) 40 MG tablet; TAKE 1 TABLET BY MOUTH EVERY DAY  Hyperlipidemia with target LDL less than 130- she has not achieved her LDL goal. I've asked her to restart the statin. -     Lipid panel; Future -     atorvastatin (LIPITOR) 40 MG tablet; TAKE 1 TABLET BY MOUTH EVERY DAY  Numbness in feet- lab work is negative for any secondary causes. Her neuro exam is suspicious for diabetic neuropathy. I've asked her to undergo a NCS/EMG to identify the cause of her symptoms. There is no discomfort associated with this so at this time will not start medical therapy to treat the symptoms. -     Vitamin B12; Future -     Methylmalonic acid, serum; Future -     Ambulatory referral to Neurology   I have changed Ms. Manrique's atorvastatin. I am also having her maintain her Calcium Carbonate-Vitamin D, ibuprofen, amLODipine, and telmisartan.  Meds ordered this encounter  Medications  . amLODipine (NORVASC) 10 MG tablet    Sig: TAKE 1 TABLET BY  MOUTH EVERY DAY (DUE FOR FOLLOW UP)    Dispense:  90 tablet    Refill:  1  . telmisartan (MICARDIS) 80 MG tablet    Sig: Take 1 tablet (80 mg total) by mouth daily.    Dispense:  90 tablet    Refill:  1  . atorvastatin (LIPITOR) 40 MG tablet    Sig: TAKE 1 TABLET BY MOUTH EVERY DAY    Dispense:  90 tablet    Refill:  1     Follow-up: Return in about 6 months (around 02/01/2017).  Scarlette Calico, MD

## 2016-08-01 NOTE — Patient Instructions (Signed)

## 2016-08-03 MED ORDER — ATORVASTATIN CALCIUM 40 MG PO TABS
ORAL_TABLET | ORAL | 1 refills | Status: DC
Start: 2016-08-03 — End: 2017-01-03

## 2016-08-04 LAB — METHYLMALONIC ACID, SERUM: Methylmalonic Acid, Quant: 80 nmol/L — ABNORMAL LOW (ref 87–318)

## 2016-08-14 ENCOUNTER — Telehealth: Payer: Self-pay | Admitting: Internal Medicine

## 2016-08-14 NOTE — Telephone Encounter (Signed)
Lab Results  Component Value Date   WBC 8.1 07/16/2015   HGB 12.3 07/16/2015   HCT 37.5 07/16/2015   PLT 189.0 07/16/2015   GLUCOSE 100 (H) 08/01/2016   CHOL 184 08/01/2016   TRIG 79.0 08/01/2016   HDL 45.90 08/01/2016   LDLDIRECT 167.9 11/09/2010   LDLCALC 123 (H) 08/01/2016   ALT 17 01/10/2016   AST 16 01/10/2016   NA 140 08/01/2016   K 3.7 08/01/2016   CL 107 08/01/2016   CREATININE 0.70 08/01/2016   BUN 15 08/01/2016   CO2 25 08/01/2016   TSH 1.25 07/16/2015   HGBA1C 6.2 08/01/2016   MICROALBUR 2.4 (H) 08/01/2016   Her cholesterol level is a little too high  Her blood sugar is well controlled  The other labs are okay, I don't see any other concerns here

## 2016-08-14 NOTE — Telephone Encounter (Signed)
Patient is requesting call back in regard to labs.  Patient states she is upset because she has not heard anything in regard.   Patient also was upset because she could not get signed into  mychart..  I have offered to get that set up for her but she has stated she does not wish to use my chart or has time for it.  She is only requesting call with results as soon as possible.

## 2016-08-14 NOTE — Telephone Encounter (Signed)
LVM for pt to call back as soon as possible.   RE: results.

## 2016-08-28 DIAGNOSIS — M1611 Unilateral primary osteoarthritis, right hip: Secondary | ICD-10-CM | POA: Diagnosis present

## 2016-08-28 NOTE — H&P (Signed)
PREOPERATIVE H&P Patient ID: [name] MRN: [id number] DOB/AGE: 07-15-1955 61 y.o.  Chief Complaint: OA RIGHT HIP  Planned Procedure Date: 09/26/16 Medical Clearance by Dr. Scarlette Calico   HPI: [name] is a 61 y.o. female with a history of HTN and HLD who presents for evaluation of OA RIGHT HIP. The patient has a history of pain and functional disability in the right hip due to arthritis and has failed non-surgical conservative treatments for greater than 12 weeks to include NSAID's and/or analgesics, corticosteriod injections and activity modification.  Onset of symptoms was gradual, starting 3 years ago with gradually worsening course since that time.  Patient currently rates pain at 9 out of 10 with activity. Patient has worsening of pain with activity and weight bearing, pain that interferes with activities of daily living and pain with passive range of motion.  Patient has evidence of subchondral cysts, subchondral sclerosis and joint space narrowing by imaging studies. There is no active infection.  Past Medical History:  Diagnosis Date  . Diabetes mellitus    NO MEDS FOR TREATMENT FOR THIS PER PT.  Marland Kitchen Hyperlipidemia   . Hypertension   . Lung nodule 03/2006   LLL 4-30mm   Past Surgical History:  Procedure Laterality Date  . OVARIAN BIOPSY    . TUBAL LIGATION     No Known Allergies Prior to Admission medications   Medication Sig Start Date End Date Taking? Authorizing Provider  amLODipine (NORVASC) 10 MG tablet TAKE 1 TABLET BY MOUTH EVERY DAY (DUE FOR FOLLOW UP) 08/01/16   Janith Lima, MD  atorvastatin (LIPITOR) 40 MG tablet TAKE 1 TABLET BY MOUTH EVERY DAY 08/03/16   Janith Lima, MD  Calcium Carbonate-Vitamin D (CALTRATE 600+D) 600-400 MG-UNIT per tablet Take 1 tablet by mouth daily.     [provider]  ibuprofen (ADVIL,MOTRIN) 800 MG tablet Take 1 tablet (800 mg total) by mouth every 8 (eight) hours as needed for moderate pain. 01/10/16   Janith Lima, MD   telmisartan (MICARDIS) 80 MG tablet Take 1 tablet (80 mg total) by mouth daily. 08/01/16   Janith Lima, MD   Social History   Social History  . Marital status: Divorced    Spouse name: N/A  . Number of children: N/A  . Years of education: N/A   Social History Main Topics  . Smoking status: Never Smoker  . Smokeless tobacco: Never Used  . Alcohol use No  . Drug use: No  . Sexual activity: Not Currently    Birth control/ protection: Surgical   Other Topics Concern  . Not on file   Social History Narrative   Divorced- 3 grown children   Laid off from work in July of 2009   Family History  Problem Relation Age of Onset  . Diabetes Brother   . Cancer Other        pancreatic cancer  . Colon cancer Neg Hx   . Rectal cancer Neg Hx   . Stomach cancer Neg Hx     ROS: Currently denies lightheadedness, dizziness, Fever, chills, CP, SOB.   No personal history of DVT, PE, MI, or CVA. No loose teeth or dentures.  Top partial. All other systems have been reviewed and were otherwise currently negative with the exception of those mentioned in the HPI and as above.  Objective: Vitals: Ht: 5'8" Wt: 215 Temp: 98.5 BP: 115/74 Pulse: 71 O2 98% on room air. Physical Exam: General: Alert, NAD.  Trendelenberg  Gait  HEENT: EOMI, Good Neck Extension  Pulm: No increased work of breathing.  Clear B/L A/P w/o crackle or wheeze.  CV: RRR, No m/g/r appreciated  GI: soft, NT, ND Neuro: Neuro without gross focal deficit.  Sensation intact distally Skin: No lesions in the area of chief complaint MSK/Surgical Site: Right Hip Non tender over greater trochanter.  Pain with passive ROM.  Positive Stinchfield.  5/5 strength.  NVI.  Sensation intact distally.   Imaging Review Plain radiographs demonstrate severe degenerative joint disease of the right hip.   Assessment: OA RIGHT HIP Principal Problem:   Primary osteoarthritis of right hip Active Problems:   Hyperlipidemia with target LDL less  than 130   Essential hypertension   Plan: Plan for Procedure(s): TOTAL HIP ARTHROPLASTY ANTERIOR APPROACH  The patient history, physical exam, clinical judgement of the provider and imaging are consistent with end stage degenerative joint disease and total joint arthroplasty is deemed medically necessary. The treatment options including medical management, injection therapy, and arthroplasty were discussed at length. The risks and benefits of Procedure(s): TOTAL HIP ARTHROPLASTY ANTERIOR APPROACH were presented and reviewed.  The risks of nonoperative treatment, versus surgical intervention including but not limited to continued pain, aseptic loosening, stiffness, dislocation/subluxation, infection, bleeding, nerve injury, blood clots, cardiopulmonary complications, morbidity, mortality, among others were discussed. The patient verbalizes understanding and wishes to proceed with the plan.  Patient is being admitted for inpatient treatment for surgery, pain control, PT, OT, prophylactic antibiotics, VTE prophylaxis, progressive ambulation, ADL's and discharge planning.   Dental prophylaxis discussed and recommended for 2 years postoperatively.   The patient does meet the criteria for TXA which will be used perioperatively via IV.    ASA 325 mg will be used postoperatively for DVT prophylaxis in addition to SCDs, and early ambulation.  Norco and or Ultram for pain.  She does not like the way narcotics make her feel.  The patient is planning to be discharged home with home health services ([facility]) in care of her sister Carmin Muskrat.  Prudencio Burly III, PA-C 08/28/2016 8:09 AM

## 2016-08-31 NOTE — Telephone Encounter (Signed)
Letter sent to patient.

## 2016-09-14 LAB — HM DIABETES EYE EXAM

## 2016-09-14 NOTE — Pre-Procedure Instructions (Signed)
[name]  09/14/2016      [id number] [address], [place] [address] Phone: [phone] Fax: [phone]    Your procedure is scheduled on September 25  Report to [place] at [place].  Call this number if you have problems the morning of surgery:  [phone]   Remember:  Do not eat food or drink liquids after midnight.   Continue all other medications as directed by your physician except follow these medication instructions before surgery   Take these medicines the morning of surgery with A SIP OF WATER  amLODipine (NORVASC)  7 days prior to surgery STOP taking any Aspirin, Aleve, Naproxen, Ibuprofen, Motrin, Advil, Goody's, BC's, all herbal medications, fish oil, and all vitamins    Do not wear jewelry, make-up or nail polish.  Do not wear lotions, powders, or perfumes, or deoderant.  Do not shave 48 hours prior to surgery.   Do not bring valuables to the hospital.  [facility] is not responsible for any belongings or valuables.  Contacts, dentures or bridgework may not be worn into surgery.  Leave your suitcase in the car.  After surgery it may be brought to your room.  For patients admitted to the hospital, discharge time will be determined by your treatment team.  Patients discharged the day of surgery will not be allowed to drive home.    Special instructions:   [facility]- Preparing For Surgery  Before surgery, you can play an important role. Because skin is not sterile, your skin needs to be as free of germs as possible. You can reduce the number of germs on your skin by washing with CHG (chlorahexidine gluconate) Soap before surgery.  CHG is an antiseptic cleaner which kills germs and bonds with the skin to continue killing germs even after washing.  Please do not use if you have an allergy to CHG or antibacterial soaps. If your skin becomes reddened/irritated  stop using the CHG.  Do not shave (including legs and underarms) for at least 48 hours prior to first CHG shower. It is OK to shave your face.  Please follow these instructions carefully.   1. Shower the NIGHT BEFORE SURGERY and the MORNING OF SURGERY with CHG.   2. If you chose to wash your hair, wash your hair first as usual with your normal shampoo.  3. After you shampoo, rinse your hair and body thoroughly to remove the shampoo.  4. Use CHG as you would any other liquid soap. You can apply CHG directly to the skin and wash gently with a scrungie or a clean washcloth.   5. Apply the CHG Soap to your body ONLY FROM THE NECK DOWN.  Do not use on open wounds or open sores. Avoid contact with your eyes, ears, mouth and genitals (private parts). Wash genitals (private parts) with your normal soap.  6. Wash thoroughly, paying special attention to the area where your surgery will be performed.  7. Thoroughly rinse your body with warm water from the neck down.  8. DO NOT shower/wash with your normal soap after using and rinsing off the CHG Soap.  9. Pat yourself dry with a CLEAN TOWEL.   10. Wear CLEAN PAJAMAS   11. Place CLEAN SHEETS on your bed the night of your first shower and DO NOT SLEEP WITH PETS.    Day of Surgery: Do not apply any deodorants/lotions. Please wear  clean clothes to the hospital/surgery center.      Please read over the following fact sheets that you were given.

## 2016-09-15 ENCOUNTER — Encounter (HOSPITAL_COMMUNITY): Payer: Self-pay

## 2016-09-15 ENCOUNTER — Encounter (HOSPITAL_COMMUNITY)
Admission: RE | Admit: 2016-09-15 | Discharge: 2016-09-15 | Disposition: A | Discharge status: Home / Self Care | Payer: Managed Care, Other (non HMO) | Source: Ambulatory Visit | Attending: Orthopedic Surgery | Admitting: Orthopedic Surgery

## 2016-09-15 DIAGNOSIS — M1610 Unilateral primary osteoarthritis, unspecified hip: Secondary | ICD-10-CM | POA: Diagnosis not present

## 2016-09-15 DIAGNOSIS — E785 Hyperlipidemia, unspecified: Secondary | ICD-10-CM | POA: Insufficient documentation

## 2016-09-15 DIAGNOSIS — K219 Gastro-esophageal reflux disease without esophagitis: Secondary | ICD-10-CM | POA: Insufficient documentation

## 2016-09-15 DIAGNOSIS — I10 Essential (primary) hypertension: Secondary | ICD-10-CM | POA: Insufficient documentation

## 2016-09-15 DIAGNOSIS — M5441 Lumbago with sciatica, right side: Secondary | ICD-10-CM | POA: Diagnosis not present

## 2016-09-15 DIAGNOSIS — M1611 Unilateral primary osteoarthritis, right hip: Secondary | ICD-10-CM | POA: Insufficient documentation

## 2016-09-15 DIAGNOSIS — E118 Type 2 diabetes mellitus with unspecified complications: Secondary | ICD-10-CM | POA: Diagnosis not present

## 2016-09-15 DIAGNOSIS — R200 Anesthesia of skin: Secondary | ICD-10-CM | POA: Insufficient documentation

## 2016-09-15 DIAGNOSIS — Z01812 Encounter for preprocedural laboratory examination: Secondary | ICD-10-CM | POA: Insufficient documentation

## 2016-09-15 HISTORY — DX: Pure hypercholesterolemia, unspecified: E78.00

## 2016-09-15 HISTORY — DX: Unspecified osteoarthritis, unspecified site: M19.90

## 2016-09-15 HISTORY — DX: Prediabetes: R73.03

## 2016-09-15 LAB — GLUCOSE, CAPILLARY: Glucose-Capillary: 95 mg/dL (ref 65–99)

## 2016-09-15 LAB — BASIC METABOLIC PANEL
Anion gap: 6 (ref 5–15)
BUN: 17 mg/dL (ref 6–20)
CHLORIDE: 111 mmol/L (ref 101–111)
CO2: 23 mmol/L (ref 22–32)
CREATININE: 0.78 mg/dL (ref 0.44–1.00)
Calcium: 9.4 mg/dL (ref 8.9–10.3)
GFR calc Af Amer: >60 mL/min (ref >60)
GFR calc non Af Amer: >60 mL/min (ref >60)
GLUCOSE: 102 mg/dL — AB (ref 65–99)
POTASSIUM: 4 mmol/L (ref 3.5–5.1)
Sodium: 140 mmol/L (ref 135–145)

## 2016-09-15 LAB — CBC
HEMATOCRIT: 37.7 % (ref 36.0–46.0)
Hemoglobin: 11.9 g/dL — ABNORMAL LOW (ref 12.0–15.0)
MCH: 25.9 pg — AB (ref 26.0–34.0)
MCHC: 31.6 g/dL (ref 30.0–36.0)
MCV: 82.1 fL (ref 78.0–100.0)
PLATELETS: 191 10*3/uL (ref 150–400)
RBC: 4.59 MIL/uL (ref 3.87–5.11)
RDW: 15.8 % — AB (ref 11.5–15.5)
WBC: 7.1 10*3/uL (ref 4.0–10.5)

## 2016-09-15 LAB — SURGICAL PCR SCREEN
MRSA, PCR: NEGATIVE
Staphylococcus aureus: NEGATIVE

## 2016-09-15 LAB — HEMOGLOBIN A1C
Hgb A1c MFr Bld: 6.1 % — ABNORMAL HIGH (ref 4.8–5.6)
Mean Plasma Glucose: 128.37 mg/dL

## 2016-09-15 NOTE — Progress Notes (Signed)
PCP - Scarlette Calico Cardiologist - denies  Chest x-ray - not needed  EKG - 08/01/16 Stress Test - denies ECHO - denies Cardiac Cath - denies  Sleep Study -  CPAP -   She is pre-diabetic checks sugars 1-2 times a month runs 80s-90s     Patient denies shortness of breath, fever, cough and chest pain at PAT appointment   Patient verbalized understanding of instructions that were given to them at the PAT appointment. Patient was also instructed that they will need to review over the PAT instructions again at home before surgery.

## 2016-09-19 LAB — HM PAP SMEAR: HM Pap smear: NEGATIVE

## 2016-09-25 MED ORDER — TRANEXAMIC ACID 1000 MG/10ML IV SOLN
2000.0000 mg | Freq: Once | INTRAVENOUS | Status: AC
  Administered 2016-09-26: 2000 mg via TOPICAL
  Filled 2016-09-25: qty 20

## 2016-09-25 MED ORDER — CEFAZOLIN SODIUM-DEXTROSE 2-4 GM/100ML-% IV SOLN
2.0000 g | INTRAVENOUS | Status: AC
  Administered 2016-09-26: 2 g via INTRAVENOUS
  Filled 2016-09-25: qty 100

## 2016-09-25 MED ORDER — TRANEXAMIC ACID 1000 MG/10ML IV SOLN
1000.0000 mg | INTRAVENOUS | Status: AC
  Administered 2016-09-26: 1000 mg via INTRAVENOUS
  Filled 2016-09-25: qty 10

## 2016-09-26 ENCOUNTER — Inpatient Hospital Stay (HOSPITAL_COMMUNITY)
Admission: RE | Admit: 2016-09-26 | Discharge: 2016-09-27 | DRG: 470 | Disposition: A | Discharge status: Home Health | Payer: Managed Care, Other (non HMO) | Source: Ambulatory Visit | Attending: Orthopedic Surgery | Admitting: Orthopedic Surgery

## 2016-09-26 ENCOUNTER — Encounter (HOSPITAL_COMMUNITY)
Admission: RE | Disposition: A | Discharge status: Home Health | Payer: Self-pay | Source: Ambulatory Visit | Attending: Orthopedic Surgery

## 2016-09-26 ENCOUNTER — Inpatient Hospital Stay (HOSPITAL_COMMUNITY): Payer: Managed Care, Other (non HMO)

## 2016-09-26 ENCOUNTER — Inpatient Hospital Stay (HOSPITAL_COMMUNITY): Payer: Managed Care, Other (non HMO) | Admitting: Emergency Medicine

## 2016-09-26 ENCOUNTER — Inpatient Hospital Stay (HOSPITAL_COMMUNITY): Payer: Managed Care, Other (non HMO) | Admitting: Anesthesiology

## 2016-09-26 ENCOUNTER — Encounter (HOSPITAL_COMMUNITY): Payer: Self-pay

## 2016-09-26 DIAGNOSIS — E669 Obesity, unspecified: Secondary | ICD-10-CM | POA: Diagnosis present

## 2016-09-26 DIAGNOSIS — Z79899 Other long term (current) drug therapy: Secondary | ICD-10-CM | POA: Diagnosis not present

## 2016-09-26 DIAGNOSIS — E119 Type 2 diabetes mellitus without complications: Secondary | ICD-10-CM | POA: Diagnosis present

## 2016-09-26 DIAGNOSIS — Z6833 Body mass index (BMI) 33.0-33.9, adult: Secondary | ICD-10-CM | POA: Diagnosis not present

## 2016-09-26 DIAGNOSIS — E785 Hyperlipidemia, unspecified: Secondary | ICD-10-CM | POA: Diagnosis present

## 2016-09-26 DIAGNOSIS — I10 Essential (primary) hypertension: Secondary | ICD-10-CM | POA: Diagnosis present

## 2016-09-26 DIAGNOSIS — M1611 Unilateral primary osteoarthritis, right hip: Secondary | ICD-10-CM | POA: Diagnosis present

## 2016-09-26 DIAGNOSIS — D62 Acute posthemorrhagic anemia: Secondary | ICD-10-CM | POA: Diagnosis not present

## 2016-09-26 DIAGNOSIS — Z419 Encounter for procedure for purposes other than remedying health state, unspecified: Secondary | ICD-10-CM

## 2016-09-26 HISTORY — PX: TOTAL HIP ARTHROPLASTY: SHX124

## 2016-09-26 SURGERY — ARTHROPLASTY, HIP, TOTAL, ANTERIOR APPROACH
Anesthesia: Spinal | Site: Hip | Laterality: Right

## 2016-09-26 MED ORDER — GLYCOPYRROLATE 0.2 MG/ML IJ SOLN
INTRAMUSCULAR | Status: DC | PRN
  Administered 2016-09-26: 0.1 mg via INTRAVENOUS

## 2016-09-26 MED ORDER — SENNA 8.6 MG PO TABS
1.0000 | ORAL_TABLET | Freq: Two times a day (BID) | ORAL | Status: DC
  Administered 2016-09-26 – 2016-09-27 (×3): 8.6 mg via ORAL
  Filled 2016-09-26 (×3): qty 1

## 2016-09-26 MED ORDER — DEXAMETHASONE SODIUM PHOSPHATE 10 MG/ML IJ SOLN
10.0000 mg | Freq: Once | INTRAMUSCULAR | Status: AC
  Administered 2016-09-27: 10 mg via INTRAVENOUS
  Filled 2016-09-26: qty 1

## 2016-09-26 MED ORDER — BUPIVACAINE-EPINEPHRINE 0.25% -1:200000 IJ SOLN
INTRAMUSCULAR | Status: DC | PRN
  Administered 2016-09-26: 30 mL

## 2016-09-26 MED ORDER — LACTATED RINGERS IV SOLN
INTRAVENOUS | Status: DC
  Administered 2016-09-27: via INTRAVENOUS

## 2016-09-26 MED ORDER — METHOCARBAMOL 500 MG PO TABS
ORAL_TABLET | ORAL | Status: AC
  Filled 2016-09-26: qty 1

## 2016-09-26 MED ORDER — METOCLOPRAMIDE HCL 5 MG/ML IJ SOLN: 5.0000 mg | Freq: Three times a day (TID) | INTRAMUSCULAR | Status: DC | PRN

## 2016-09-26 MED ORDER — FENTANYL CITRATE (PF) 100 MCG/2ML IJ SOLN
INTRAMUSCULAR | Status: DC | PRN
  Administered 2016-09-26 (×5): 25 ug via INTRAVENOUS
  Administered 2016-09-26: 50 ug via INTRAVENOUS
  Administered 2016-09-26: 25 ug via INTRAVENOUS

## 2016-09-26 MED ORDER — KETOROLAC TROMETHAMINE 15 MG/ML IJ SOLN
15.0000 mg | Freq: Three times a day (TID) | INTRAMUSCULAR | Status: DC
  Administered 2016-09-26 – 2016-09-27 (×3): 15 mg via INTRAVENOUS
  Filled 2016-09-26 (×3): qty 1

## 2016-09-26 MED ORDER — IRBESARTAN 300 MG PO TABS
300.0000 mg | ORAL_TABLET | Freq: Every day | ORAL | Status: DC
  Administered 2016-09-27: 300 mg via ORAL
  Filled 2016-09-26: qty 1

## 2016-09-26 MED ORDER — ACETAMINOPHEN 500 MG PO TABS
1000.0000 mg | ORAL_TABLET | Freq: Once | ORAL | Status: AC
  Administered 2016-09-26: 1000 mg via ORAL
  Filled 2016-09-26: qty 2

## 2016-09-26 MED ORDER — KETOROLAC TROMETHAMINE 30 MG/ML IJ SOLN
INTRAMUSCULAR | Status: DC | PRN
  Administered 2016-09-26: 30 mg via INTRA_ARTICULAR

## 2016-09-26 MED ORDER — FENTANYL CITRATE (PF) 100 MCG/2ML IJ SOLN
25.0000 ug | INTRAMUSCULAR | Status: DC | PRN
  Administered 2016-09-26: 50 ug via INTRAVENOUS

## 2016-09-26 MED ORDER — LIDOCAINE 2% (20 MG/ML) 5 ML SYRINGE
INTRAMUSCULAR | Status: AC
  Filled 2016-09-26: qty 5

## 2016-09-26 MED ORDER — DEXTROSE 5 % IV SOLN
500.0000 mg | Freq: Four times a day (QID) | INTRAVENOUS | Status: DC | PRN
  Filled 2016-09-26: qty 5

## 2016-09-26 MED ORDER — BUPIVACAINE IN DEXTROSE 0.75-8.25 % IT SOLN
INTRATHECAL | Status: DC | PRN
  Administered 2016-09-26: 1.8 mL via INTRATHECAL

## 2016-09-26 MED ORDER — BACLOFEN 10 MG PO TABS: 10.0000 mg | ORAL_TABLET | Freq: Three times a day (TID) | ORAL | 0 refills | Status: DC | PRN

## 2016-09-26 MED ORDER — GABAPENTIN 300 MG PO CAPS
300.0000 mg | ORAL_CAPSULE | Freq: Once | ORAL | Status: AC
  Administered 2016-09-26: 300 mg via ORAL
  Filled 2016-09-26: qty 1

## 2016-09-26 MED ORDER — POLYETHYLENE GLYCOL 3350 17 G PO PACK: 17.0000 g | PACK | Freq: Every day | ORAL | Status: DC | PRN

## 2016-09-26 MED ORDER — MIDAZOLAM HCL 2 MG/2ML IJ SOLN
INTRAMUSCULAR | Status: AC
  Filled 2016-09-26: qty 2

## 2016-09-26 MED ORDER — ONDANSETRON HCL 4 MG/2ML IJ SOLN: 4.0000 mg | Freq: Four times a day (QID) | INTRAMUSCULAR | Status: DC | PRN

## 2016-09-26 MED ORDER — SORBITOL 70 % SOLN: 30.0000 mL | Freq: Every day | Status: DC | PRN

## 2016-09-26 MED ORDER — MIDAZOLAM HCL 5 MG/5ML IJ SOLN
INTRAMUSCULAR | Status: DC | PRN
  Administered 2016-09-26: 2 mg via INTRAVENOUS

## 2016-09-26 MED ORDER — METHOCARBAMOL 500 MG PO TABS
500.0000 mg | ORAL_TABLET | Freq: Four times a day (QID) | ORAL | Status: DC | PRN
  Administered 2016-09-26 (×2): 500 mg via ORAL
  Filled 2016-09-26: qty 1

## 2016-09-26 MED ORDER — ATORVASTATIN CALCIUM 40 MG PO TABS
40.0000 mg | ORAL_TABLET | Freq: Every day | ORAL | Status: DC
  Administered 2016-09-27: 40 mg via ORAL
  Filled 2016-09-26: qty 1

## 2016-09-26 MED ORDER — ASPIRIN EC 325 MG PO TBEC: 325.0000 mg | DELAYED_RELEASE_TABLET | Freq: Every day | ORAL | 0 refills | Status: AC

## 2016-09-26 MED ORDER — ACETAMINOPHEN 325 MG PO TABS: 650.0000 mg | ORAL_TABLET | Freq: Four times a day (QID) | ORAL | Status: DC | PRN

## 2016-09-26 MED ORDER — ASPIRIN EC 325 MG PO TBEC
325.0000 mg | DELAYED_RELEASE_TABLET | Freq: Every day | ORAL | Status: DC
  Administered 2016-09-27: 325 mg via ORAL
  Filled 2016-09-26: qty 1

## 2016-09-26 MED ORDER — MENTHOL 3 MG MT LOZG: 1.0000 | LOZENGE | OROMUCOSAL | Status: DC | PRN

## 2016-09-26 MED ORDER — HYDROMORPHONE HCL 1 MG/ML IJ SOLN: 0.2000 mg | INTRAMUSCULAR | Status: DC | PRN

## 2016-09-26 MED ORDER — 0.9 % SODIUM CHLORIDE (POUR BTL) OPTIME
TOPICAL | Status: DC | PRN
  Administered 2016-09-26: 1000 mL

## 2016-09-26 MED ORDER — ACETAMINOPHEN 650 MG RE SUPP: 650.0000 mg | Freq: Four times a day (QID) | RECTAL | Status: DC | PRN

## 2016-09-26 MED ORDER — ONDANSETRON HCL 4 MG PO TABS: 4.0000 mg | ORAL_TABLET | Freq: Four times a day (QID) | ORAL | Status: DC | PRN

## 2016-09-26 MED ORDER — PHENYLEPHRINE HCL 10 MG/ML IJ SOLN
INTRAMUSCULAR | Status: DC | PRN
  Administered 2016-09-26 (×2): 120 ug via INTRAVENOUS

## 2016-09-26 MED ORDER — HYDROCODONE-ACETAMINOPHEN 5-325 MG PO TABS
ORAL_TABLET | ORAL | Status: AC
Start: 2016-09-26 — End: 2016-09-27
  Filled 2016-09-26: qty 2

## 2016-09-26 MED ORDER — ROCURONIUM BROMIDE 10 MG/ML (PF) SYRINGE
PREFILLED_SYRINGE | INTRAVENOUS | Status: AC
  Filled 2016-09-26: qty 5

## 2016-09-26 MED ORDER — FLEET ENEMA 7-19 GM/118ML RE ENEM: 1.0000 | ENEMA | Freq: Once | RECTAL | Status: DC | PRN

## 2016-09-26 MED ORDER — PROPOFOL 10 MG/ML IV BOLUS
INTRAVENOUS | Status: AC
  Filled 2016-09-26: qty 20

## 2016-09-26 MED ORDER — CEFAZOLIN SODIUM-DEXTROSE 2-4 GM/100ML-% IV SOLN
2.0000 g | Freq: Four times a day (QID) | INTRAVENOUS | Status: AC
  Administered 2016-09-26 (×2): 2 g via INTRAVENOUS
  Filled 2016-09-26 (×2): qty 100

## 2016-09-26 MED ORDER — LACTATED RINGERS IV SOLN
INTRAVENOUS | Status: DC
  Administered 2016-09-26 (×2): via INTRAVENOUS

## 2016-09-26 MED ORDER — CHLORHEXIDINE GLUCONATE 4 % EX LIQD: 60.0000 mL | Freq: Once | CUTANEOUS | Status: DC

## 2016-09-26 MED ORDER — DOCUSATE SODIUM 100 MG PO CAPS
100.0000 mg | ORAL_CAPSULE | Freq: Two times a day (BID) | ORAL | Status: DC
  Administered 2016-09-26 – 2016-09-27 (×3): 100 mg via ORAL
  Filled 2016-09-26 (×3): qty 1

## 2016-09-26 MED ORDER — AMLODIPINE BESYLATE 10 MG PO TABS
10.0000 mg | ORAL_TABLET | Freq: Every day | ORAL | Status: DC
  Administered 2016-09-27: 10 mg via ORAL
  Filled 2016-09-26: qty 1

## 2016-09-26 MED ORDER — METOCLOPRAMIDE HCL 5 MG PO TABS: 5.0000 mg | ORAL_TABLET | Freq: Three times a day (TID) | ORAL | Status: DC | PRN

## 2016-09-26 MED ORDER — DIPHENHYDRAMINE HCL 12.5 MG/5ML PO ELIX: 12.5000 mg | ORAL_SOLUTION | ORAL | Status: DC | PRN

## 2016-09-26 MED ORDER — ESMOLOL HCL 100 MG/10ML IV SOLN
INTRAVENOUS | Status: DC | PRN
  Administered 2016-09-26: 10 mg via INTRAVENOUS

## 2016-09-26 MED ORDER — INFLUENZA VAC SPLIT QUAD 0.5 ML IM SUSY: 0.5000 mL | PREFILLED_SYRINGE | INTRAMUSCULAR | Status: DC

## 2016-09-26 MED ORDER — FENTANYL CITRATE (PF) 100 MCG/2ML IJ SOLN
INTRAMUSCULAR | Status: AC
  Filled 2016-09-26: qty 2

## 2016-09-26 MED ORDER — BUPIVACAINE-EPINEPHRINE (PF) 0.25% -1:200000 IJ SOLN
INTRAMUSCULAR | Status: AC
  Filled 2016-09-26: qty 30

## 2016-09-26 MED ORDER — ONDANSETRON HCL 4 MG/2ML IJ SOLN: 4.0000 mg | Freq: Once | INTRAMUSCULAR | Status: DC | PRN

## 2016-09-26 MED ORDER — PROPOFOL 500 MG/50ML IV EMUL
INTRAVENOUS | Status: DC | PRN
  Administered 2016-09-26: 75 ug/kg/min via INTRAVENOUS

## 2016-09-26 MED ORDER — HYDROCODONE-ACETAMINOPHEN 5-325 MG PO TABS: 1.0000 | ORAL_TABLET | ORAL | 0 refills | Status: DC | PRN

## 2016-09-26 MED ORDER — OMEPRAZOLE 20 MG PO CPDR: 20.0000 mg | DELAYED_RELEASE_CAPSULE | Freq: Every day | ORAL | 0 refills | Status: DC

## 2016-09-26 MED ORDER — PHENOL 1.4 % MT LIQD: 1.0000 | OROMUCOSAL | Status: DC | PRN

## 2016-09-26 MED ORDER — HYDROCODONE-ACETAMINOPHEN 5-325 MG PO TABS
1.0000 | ORAL_TABLET | ORAL | Status: DC | PRN
  Administered 2016-09-26 – 2016-09-27 (×4): 2 via ORAL
  Filled 2016-09-26 (×3): qty 2

## 2016-09-26 MED ORDER — ONDANSETRON HCL 4 MG PO TABS: 4.0000 mg | ORAL_TABLET | Freq: Three times a day (TID) | ORAL | 0 refills | Status: DC | PRN

## 2016-09-26 MED ORDER — FENTANYL CITRATE (PF) 250 MCG/5ML IJ SOLN
INTRAMUSCULAR | Status: AC
  Filled 2016-09-26: qty 5

## 2016-09-26 MED ORDER — DOCUSATE SODIUM 100 MG PO CAPS: 100.0000 mg | ORAL_CAPSULE | Freq: Two times a day (BID) | ORAL | 0 refills | Status: DC

## 2016-09-26 SURGICAL SUPPLY — 51 items
BAG DECANTER FOR FLEXI CONT (MISCELLANEOUS) ×2 IMPLANT
BLADE SAG 18X100X1.27 (BLADE) ×2 IMPLANT
CAPT HIP TOTAL 3 ×2 IMPLANT
CLOSURE STERI-STRIP 1/2X4 (GAUZE/BANDAGES/DRESSINGS) ×2
CLOSURE WOUND 1/2 X4 (GAUZE/BANDAGES/DRESSINGS) ×1
CLSR STERI-STRIP ANTIMIC 1/2X4 (GAUZE/BANDAGES/DRESSINGS) ×3 IMPLANT
COVER PERINEAL POST (MISCELLANEOUS) ×3 IMPLANT
COVER SURGICAL LIGHT HANDLE (MISCELLANEOUS) ×3 IMPLANT
DRAPE C-ARM 42X72 X-RAY (DRAPES) ×3 IMPLANT
DRAPE STERI IOBAN 125X83 (DRAPES) ×3 IMPLANT
DRAPE U-SHAPE 47X51 STRL (DRAPES) ×2 IMPLANT
DRSG MEPILEX BORDER 4X8 (GAUZE/BANDAGES/DRESSINGS) ×3 IMPLANT
DURAPREP 26ML APPLICATOR (WOUND CARE) ×3 IMPLANT
ELECT BLADE 4.0 EZ CLEAN MEGAD (MISCELLANEOUS) ×3
ELECT REM PT RETURN 9FT ADLT (ELECTROSURGICAL) ×3
ELECTRODE BLDE 4.0 EZ CLN MEGD (MISCELLANEOUS) ×1 IMPLANT
ELECTRODE REM PT RTRN 9FT ADLT (ELECTROSURGICAL) ×1 IMPLANT
FACESHIELD WRAPAROUND (MASK) ×6 IMPLANT
FACESHIELD WRAPAROUND OR TEAM (MASK) ×2 IMPLANT
GLOVE BIO SURGEON STRL SZ7.5 (GLOVE) ×8 IMPLANT
GLOVE BIOGEL PI IND STRL 8 (GLOVE) ×2 IMPLANT
GLOVE BIOGEL PI INDICATOR 8 (GLOVE) ×4
GOWN STRL REUS W/ TWL LRG LVL3 (GOWN DISPOSABLE) ×2 IMPLANT
GOWN STRL REUS W/TWL LRG LVL3 (GOWN DISPOSABLE) ×12
KIT BASIN OR (CUSTOM PROCEDURE TRAY) ×3 IMPLANT
KIT ROOM TURNOVER OR (KITS) ×3 IMPLANT
MANIFOLD NEPTUNE II (INSTRUMENTS) ×3 IMPLANT
NDL SAFETY ECLIPSE 18X1.5 (NEEDLE) IMPLANT
NEEDLE HYPO 18GX1.5 SHARP (NEEDLE) ×3
NEEDLE HYPO 22GX1.5 SAFETY (NEEDLE) ×1 IMPLANT
NS IRRIG 1000ML POUR BTL (IV SOLUTION) ×3 IMPLANT
PACK TOTAL JOINT (CUSTOM PROCEDURE TRAY) ×3 IMPLANT
PAD ARMBOARD 7.5X6 YLW CONV (MISCELLANEOUS) ×3 IMPLANT
SPONGE LAP 18X18 X RAY DECT (DISPOSABLE) IMPLANT
STRIP CLOSURE SKIN 1/2X4 (GAUZE/BANDAGES/DRESSINGS) ×1 IMPLANT
SUT MNCRL AB 4-0 PS2 18 (SUTURE) ×3 IMPLANT
SUT MON AB 2-0 CT1 36 (SUTURE) ×3 IMPLANT
SUT VIC AB 0 CT1 27 (SUTURE) ×3
SUT VIC AB 0 CT1 27XBRD ANBCTR (SUTURE) ×1 IMPLANT
SUT VIC AB 1 CT1 27 (SUTURE) ×3
SUT VIC AB 1 CT1 27XBRD ANBCTR (SUTURE) ×1 IMPLANT
SUT VLOC 180 0 24IN GS25 (SUTURE) ×2 IMPLANT
SYR 50ML LL SCALE MARK (SYRINGE) ×3 IMPLANT
SYR BULB IRRIGATION 50ML (SYRINGE) ×3 IMPLANT
SYRINGE 20CC LL (MISCELLANEOUS) IMPLANT
TOWEL OR 17X24 6PK STRL BLUE (TOWEL DISPOSABLE) ×1 IMPLANT
TOWEL OR 17X26 10 PK STRL BLUE (TOWEL DISPOSABLE) ×3 IMPLANT
TRAY CATH 16FR W/PLASTIC CATH (SET/KITS/TRAYS/PACK) ×2 IMPLANT
TRAY FOLEY W/METER SILVER 16FR (SET/KITS/TRAYS/PACK) IMPLANT
WATER STERILE IRR 1000ML POUR (IV SOLUTION) ×3 IMPLANT
YANKAUER SUCT BULB TIP NO VENT (SUCTIONS) ×4 IMPLANT

## 2016-09-26 NOTE — Discharge Instructions (Signed)

## 2016-09-26 NOTE — Anesthesia Postprocedure Evaluation (Signed)
Anesthesia Post Note  Patient: [name]  Procedure(s) Performed: Procedure(s) (LRB): TOTAL HIP ARTHROPLASTY ANTERIOR APPROACH (Right)     Patient location during evaluation: PACU Anesthesia Type: Spinal Level of consciousness: oriented and awake and alert Pain management: pain level controlled Vital Signs Assessment: post-procedure vital signs reviewed and stable Respiratory status: spontaneous breathing, respiratory function stable and patient connected to nasal cannula oxygen Cardiovascular status: blood pressure returned to baseline and stable Postop Assessment: no headache, no backache and no apparent nausea or vomiting Anesthetic complications: no    Last Vitals:  Vitals:   09/26/16 1130 09/26/16 1158  BP: 118/71 122/74  Pulse: (!) 59 (!) 57  Resp: 18 15  Temp:    SpO2: 100% 100%    Last Pain:  Vitals:   09/26/16 1205  TempSrc:   PainSc: Asleep                 Sharday Michl P Jordi Lacko

## 2016-09-26 NOTE — Op Note (Signed)
09/26/2016  10:35 AM  PATIENT:  [name]   MRN: [id number]  PRE-OPERATIVE DIAGNOSIS:  OA RIGHT HIP  POST-OPERATIVE DIAGNOSIS:  Osteoarthritis, RIGHT HIP  PROCEDURE:  Procedure(s): TOTAL HIP ARTHROPLASTY ANTERIOR APPROACH  PREOPERATIVE INDICATIONS:    MALIA CORSI is an 61 y.o. female who has a diagnosis of Primary osteoarthritis of right hip and elected for surgical management after failing conservative treatment.  The risks benefits and alternatives were discussed with the patient including but not limited to the risks of nonoperative treatment, versus surgical intervention including infection, bleeding, nerve injury, periprosthetic fracture, the need for revision surgery, dislocation, leg length discrepancy, blood clots, cardiopulmonary complications, morbidity, mortality, among others, and they were willing to proceed.     OPERATIVE REPORT     SURGEON:   Kashus Karlen, Ernesta Amble, MD    ASSISTANT:  Roxan Hockey, PA-C, he was present and scrubbed throughout the case, critical for completion in a timely fashion, and for retraction, instrumentation, and closure.     ANESTHESIA:  General    COMPLICATIONS:  None.     COMPONENTS:  [employer]ker acolade fit femur size 5 with a 36 mm -5 head ball and a PSL acetabular shell size 50 with a  polyethylene liner    PROCEDURE IN DETAIL:   The patient was met in the holding area and  identified.  The appropriate hip was identified and marked at the operative site.  The patient was then transported to the OR  and  placed under anesthesia per that record.  At that point, the patient was  placed in the supine position and  secured to the operating room table and all bony prominences padded. He received pre-operative antibiotics    The operative lower extremity was prepped from the iliac crest to the distal leg.  Sterile draping was performed.  Time out was performed prior to incision.      Skin incision was made just 2 cm lateral to the ASIS   extending in line with the tensor fascia lata. Electrocautery was used to control all bleeders. I dissected down sharply to the fascia of the tensor fascia lata was confirmed that the muscle fibers beneath were running posteriorly. I then incised the fascia over the superficial tensor fascia lata in line with the incision. The fascia was elevated off the anterior aspect of the muscle the muscle was retracted posteriorly and protected throughout the case. I then used electrocautery to incise the tensor fascia lata fascia control and all bleeders. Immediately visible was the fat over top of the anterior neck and capsule.  I removed the anterior fat from the capsule and elevated the rectus muscle off of the anterior capsule. I then removed a large time of capsule. The retractors were then placed over the anterior acetabulum as well as around the superior and inferior neck.  I then removed a section of the femoral neck and a napkin ring fashion. Then used the power course to remove the femoral head from the acetabulum and thoroughly irrigated the acetabulum. I sized the femoral head.    I then exposed the deep acetabulum, cleared out any tissue including the ligamentum teres.   After adequate visualization, I excised the labrum, and then sequentially reamed.  I then impacted the acetabular implant into place using fluoroscopy for guidance.  Appropriate version and inclination was confirmed clinically matching their bony anatomy, and with fluoroscopy.  I placed a 20 mm screw in the posterior/superio position with an excellent bite.  I then placed the polyethylene liner in place  I then adducted the leg and released the external rotators from the posterior femur allowing it to be easily delivered up lateral and anterior to the acetabulum for preparation of the femoral canal.    I then prepared the proximal femur using the cookie-cutter and then sequentially reamed and broached.  A trial broach, neck, and  head was utilized, and I reduced the hip and used floroscopy to assess the neck length and femoral implant.  I then impacted the femoral prosthesis into place into the appropriate version. The hip was then reduced and fluoroscopy confirmed appropriate position. Leg lengths were restored.  I then irrigated the hip copiously again with, and repaired the fascia with Vicryl, followed by monocryl for the subcutaneous tissue, Monocryl for the skin, Steri-Strips and sterile gauze. The patient was then awakened and returned to PACU in stable and satisfactory condition. There were no complications.  POST OPERATIVE PLAN: WBAT, DVT px: SCD's/TED, ambulation and chemical dvt px  Edmonia Lynch, MD Orthopedic Surgeon [phone]

## 2016-09-26 NOTE — Anesthesia Procedure Notes (Signed)
Spinal  Patient location during procedure: OR Start time: 09/26/2016 8:30 AM End time: 09/26/2016 8:40 AM Staffing Anesthesiologist: Adele Barthel P Performed: anesthesiologist  Preanesthetic Checklist Completed: patient identified, surgical consent, pre-op evaluation, timeout performed, IV checked, risks and benefits discussed and monitors and equipment checked Spinal Block Patient position: sitting Prep: DuraPrep Patient monitoring: cardiac monitor, continuous pulse ox and blood pressure Approach: midline Location: L3-4 Injection technique: single-shot Needle Needle type: Pencan  Needle gauge: 24 G Needle length: 9 cm Assessment Sensory level: T10 Additional Notes Functioning IV was confirmed and monitors were applied. Sterile prep and drape, including hand hygiene and sterile gloves were used. The patient was positioned and the spine was prepped. The skin was anesthetized with lidocaine.  Free flow of clear CSF was obtained prior to injecting local anesthetic into the CSF.  The spinal needle aspirated freely following injection.  The needle was carefully withdrawn.  The patient tolerated the procedure well.

## 2016-09-26 NOTE — Evaluation (Signed)
Physical Therapy Evaluation Patient Details Name: [name] MRN: [id number] DOB: 1955/09/14 Today's Date: 09/26/2016   History of Present Illness  Patient is a 61 y/o female presents s/p right THA, direct anterior approach. PMH includes HTN, HLD, DM, lung nodule.  Clinical Impression  Patient presents with pain and post surgical deficits s/p above surgery. Tolerated gait training with min guard assist for safety and cues for RW management. Pt independent PTA and working. Wishes to get back to the gym to lose some weight. Education re: exercises, positioning etc. Pt plans to d/c home and has support of daughter. Will plan for stair training tomorrow as tolerated. Will follow acutely to maximize independence and mobility prior to return home.     Follow Up Recommendations DC plan and follow up therapy as arranged by surgeon    Equipment Recommendations  Rolling walker with 5" wheels    Recommendations for Other Services       Precautions / Restrictions Precautions Precautions: Fall Restrictions Weight Bearing Restrictions: Yes RLE Weight Bearing: Weight bearing as tolerated      Mobility  Bed Mobility Overal bed mobility: Needs Assistance Bed Mobility: Supine to Sit     Supine to sit: Min assist;HOB elevated     General bed mobility comments: Assist to bring RLE to EOB. Use of rail.   Transfers Overall transfer level: Needs assistance Equipment used: Rolling walker (2 wheeled) Transfers: Sit to/from Stand Sit to Stand: Min guard         General transfer comment: Min guard for safety. Stood from [employer]. transferred to chair post ambulation.  Ambulation/Gait Ambulation/Gait assistance: Min guard Ambulation Distance (Feet): 150 Feet Assistive device: Rolling walker (2 wheeled) Gait Pattern/deviations: Step-to pattern;Step-through pattern;Decreased stance time - right Gait velocity: decreased Gait velocity interpretation: Below normal speed for age/gender General  Gait Details: Cues to step through gait and for bigger step length on right. Cues for RW proximity.   Stairs            Wheelchair Mobility    Modified Rankin (Stroke Patients Only)       Balance Overall balance assessment: Needs assistance Sitting-balance support: Feet supported;No upper extremity supported Sitting balance-Leahy Scale: Good     Standing balance support: During functional activity Standing balance-Leahy Scale: Fair Standing balance comment: Able to stand at sink and wash hands without UE support on RW.                              Pertinent Vitals/Pain Pain Assessment: 0-10 Pain Score: 8  Pain Location: right hip Pain Descriptors / Indicators: Sore;Operative site guarding Pain Intervention(s): Monitored during session;Repositioned    Home Living Family/patient expects to be discharged to:: Private residence Living Arrangements: Children Available Help at Discharge: Family;Available PRN/intermittently Type of Home: House Home Access: Stairs to enter Entrance Stairs-Rails: Right Entrance Stairs-Number of Steps: 4-5 Home Layout: Two level;Able to live on main level with bedroom/bathroom Home Equipment: None      Prior Function Level of Independence: Independent         Comments: Works, drives.      Hand Dominance        Extremity/Trunk Assessment   Upper Extremity Assessment Upper Extremity Assessment: Defer to OT evaluation    Lower Extremity Assessment Lower Extremity Assessment: RLE deficits/detail RLE Deficits / Details: Able to perform LAQ without difficulty.  RLE Sensation:  [facility])       Communication  Communication: No difficulties  Cognition Arousal/Alertness: Awake/alert Behavior During Therapy: WFL for tasks assessed/performed Overall Cognitive Status: Within Functional Limits for tasks assessed                                        General Comments General comments (skin integrity,  edema, etc.): VSS throughout.     Exercises Total Joint Exercises Ankle Circles/Pumps: Both;10 reps;Supine Quad Sets: Both;10 reps;Supine   Assessment/Plan    PT Assessment Patient needs continued PT services  PT Problem List Decreased strength;Decreased mobility;Pain;Decreased balance;Decreased knowledge of use of DME;Decreased range of motion       PT Treatment Interventions Therapeutic activities;Gait training;Therapeutic exercise;Balance training;Stair training;Patient/family education;Functional mobility training;DME instruction    PT Goals (Current goals can be found in the Care Plan section)  Acute Rehab PT Goals Patient Stated Goal: to get back to the gym and lose some weight PT Goal Formulation: With patient Time For Goal Achievement: 10/10/16 Potential to Achieve Goals: Good    Frequency 7X/week   Barriers to discharge Inaccessible home environment stairs to enter home    Co-evaluation               AM-PAC PT "6 Clicks" Daily Activity  Outcome Measure Difficulty turning over in bed (including adjusting bedclothes, sheets and blankets)?: None Difficulty moving from lying on back to sitting on the side of the bed? : Unable Difficulty sitting down on and standing up from a chair with arms (e.g., wheelchair, bedside commode, etc,.)?: Unable Help needed moving to and from a bed to chair (including a wheelchair)?: A Little Help needed walking in hospital room?: A Little Help needed climbing 3-5 steps with a railing? : A Little 6 Click Score: 15    End of Session Equipment Utilized During Treatment: Gait belt Activity Tolerance: Patient tolerated treatment well Patient left: in chair;with call bell/phone within reach Nurse Communication: Mobility status PT Visit Diagnosis: Pain;Difficulty in walking, not elsewhere classified (R26.2) Pain - Right/Left: Right Pain - part of body: Hip    Time: 6387-5643 PT Time Calculation (min) (ACUTE ONLY): 28  min   Charges:   PT Evaluation $PT Eval Low Complexity: 1 Low PT Treatments $Gait Training: 8-22 mins   PT G Codes:        Wray Kearns, PT, DPT [phone]    Marguarite Arbour A Ogechi Kuehnel 09/26/2016, 4:13 PM

## 2016-09-26 NOTE — Anesthesia Procedure Notes (Signed)
Procedure Name: MAC Date/Time: 09/26/2016 8:37 AM Performed by: Lance Coon Pre-anesthesia Checklist: Patient identified, Emergency Drugs available, Suction available, Patient being monitored and Timeout performed Patient Re-evaluated:Patient Re-evaluated prior to induction Oxygen Delivery Method: Simple face mask

## 2016-09-26 NOTE — Interval H&P Note (Signed)
History and Physical Interval Note:  09/26/2016 7:16 AM  [name]  has presented today for surgery, with the diagnosis of OA RIGHT HIP  The various methods of treatment have been discussed with the patient and family. After consideration of risks, benefits and other options for treatment, the patient has consented to  Procedure(s): TOTAL HIP ARTHROPLASTY ANTERIOR APPROACH (Right) as a surgical intervention .  The patient's history has been reviewed, patient examined, no change in status, stable for surgery.  I have reviewed the patient's chart and labs.  Questions were answered to the patient's satisfaction.     MURPHY, TIMOTHY D

## 2016-09-26 NOTE — Plan of Care (Signed)
Problem: Pain Managment: Goal: General experience of comfort will improve Patient voices understanding of pain scale and calls for medication when Needed

## 2016-09-26 NOTE — Transfer of Care (Signed)
Immediate Anesthesia Transfer of Care Note  Patient: [name]  Procedure(s) Performed: Procedure(s): TOTAL HIP ARTHROPLASTY ANTERIOR APPROACH (Right)  Patient Location: PACU  Anesthesia Type:Spinal and MAC combined with regional for post-op pain  Level of Consciousness: sedated and patient cooperative  Airway & Oxygen Therapy: Patient Spontanous Breathing and Patient connected to face mask oxygen  Post-op Assessment: Report given to RN and Post -op Vital signs reviewed and stable  Post vital signs: Reviewed and stable  Last Vitals:  Vitals:   09/26/16 0556 09/26/16 1058  BP: 140/78 (!) 107/51  Pulse: 77 72  Resp: 20 14  Temp: 36.7 C (!) 36.1 C  SpO2: 99% 99%    Last Pain:  Vitals:   09/26/16 1058  TempSrc:   PainSc: (P) Asleep      Patients Stated Pain Goal: 3 (69/62/95 2841)  Complications: No apparent anesthesia complications

## 2016-09-26 NOTE — Anesthesia Preprocedure Evaluation (Addendum)
Anesthesia Evaluation  Patient identified by MRN, date of birth, ID band Patient awake    Reviewed: Allergy & Precautions, NPO status , Patient's Chart, lab work & pertinent test results  Airway Mallampati: III  TM Distance: >3 FB Neck ROM: Full    Dental no notable dental hx.    Pulmonary neg pulmonary ROS,    Pulmonary exam normal breath sounds clear to auscultation       Cardiovascular hypertension, Pt. on medications Normal cardiovascular exam Rhythm:Regular Rate:Normal  ECG: SR, rate 71   Neuro/Psych negative neurological ROS  negative psych ROS   GI/Hepatic negative GI ROS, Neg liver ROS,   Endo/Other  diabetes  Renal/GU negative Renal ROS     Musculoskeletal  (+) Arthritis , Osteoarthritis,    Abdominal (+) + obese,   Peds  Hematology negative hematology ROS (+)   Anesthesia Other Findings HLD  Reproductive/Obstetrics                           Anesthesia Physical Anesthesia Plan  ASA: III  Anesthesia Plan: Spinal   Post-op Pain Management:    Induction: Intravenous  PONV Risk Score and Plan: 2 and Ondansetron, Dexamethasone and Propofol infusion  Airway Management Planned: Natural Airway  Additional Equipment:   Intra-op Plan:   Post-operative Plan:   Informed Consent: I have reviewed the patients History and Physical, chart, labs and discussed the procedure including the risks, benefits and alternatives for the proposed anesthesia with the patient or authorized representative who has indicated his/her understanding and acceptance.   Dental advisory given  Plan Discussed with: CRNA  Anesthesia Plan Comments:        Anesthesia Quick Evaluation

## 2016-09-27 ENCOUNTER — Encounter (HOSPITAL_COMMUNITY): Payer: Self-pay | Admitting: Orthopedic Surgery

## 2016-09-27 LAB — CBC
HCT: 31.7 % — ABNORMAL LOW (ref 36.0–46.0)
HEMOGLOBIN: 10.1 g/dL — AB (ref 12.0–15.0)
MCH: 26.2 pg (ref 26.0–34.0)
MCHC: 31.9 g/dL (ref 30.0–36.0)
MCV: 82.1 fL (ref 78.0–100.0)
PLATELETS: 140 10*3/uL — AB (ref 150–400)
RBC: 3.86 MIL/uL — AB (ref 3.87–5.11)
RDW: 16.2 % — ABNORMAL HIGH (ref 11.5–15.5)
WBC: 7.3 10*3/uL (ref 4.0–10.5)

## 2016-09-27 NOTE — Progress Notes (Signed)
   Assessment / Plan: 1 Day Post-Op  S/P Procedure(s) (LRB): TOTAL HIP ARTHROPLASTY ANTERIOR APPROACH (Right) by Dr. Ernesta Amble. Percell Miller on 09/26/16  Principal Problem:   Primary osteoarthritis of right hip Active Problems:   Hyperlipidemia with target LDL less than 130   Essential hypertension Acute Blood Loss Anemia, mild, asymptomatic, likely with dilutional component.  No signs of active bleeding.  S/P Right Total Hip Arthroplasty: Doing very well POD1.  Pain controlled.  Mobilizing well.  Ready for discharge to home today.  Advance diet Up with therapy D/C IV fluids Discharge home with home health Incentive Spirometry Apply ice prn  Weight Bearing: Weight Bearing as Tolerated (WBAT)  Dressings: Maintain.  VTE prophylaxis: Aspirin, SCDs, ambulation Dispo: Home today.  Subjective: Patient reports pain as mild.  Tolerating diet.  Urinating.  +Flatus.  No CP, SOB.  OOB walking in hallway.  Objective:   VITALS:   Vitals:   09/26/16 1515 09/26/16 1539 09/26/16 2158 09/27/16 0640  BP:  118/66 134/69 125/61  Pulse: 70 75 90   Resp: (!) 24 20 18    Temp:  (!) 97.5 F (36.4 C) 98.6 F (37 C)   TempSrc:  Oral Oral   SpO2: 98% 100% 100% 100%  Weight:      Height:       CBC Latest Ref Rng & Units 09/27/2016 09/15/2016 07/16/2015  WBC 4.0 - 10.5 K/uL 7.3 7.1 8.1  Hemoglobin 12.0 - 15.0 g/dL 10.1(L) 11.9(L) 12.3  Hematocrit 36.0 - 46.0 % 31.7(L) 37.7 37.5  Platelets 150 - 400 K/uL 140(L) 191 189.0   BMP Latest Ref Rng & Units 09/15/2016 08/01/2016 01/10/2016  Glucose 65 - 99 mg/dL 102(H) 100(H) 93  BUN 6 - 20 mg/dL 17 15 21   Creatinine 0.44 - 1.00 mg/dL 0.78 0.70 0.69  Sodium 135 - 145 mmol/L 140 140 142  Potassium 3.5 - 5.1 mmol/L 4.0 3.7 4.0  Chloride 101 - 111 mmol/L 111 107 108  CO2 22 - 32 mmol/L 23 25 24   Calcium 8.9 - 10.3 mg/dL 9.4 9.2 9.5   Intake/Output      09/25 0701 - 09/26 0700 09/26 0701 - 09/27 0700   I.V. (mL/kg) 2300 (23.6)    Total Intake(mL/kg) 2300  (23.6)    Urine (mL/kg/hr) 450 (0.2)    Blood 350    Total Output 800     Net +1500          Urine Occurrence 3 x       Physical Exam: General: NAD.  Sitting upright in chair.  Tranfers to bed easily without assistance. Resp: No increased wob Cardio: regular rate and rhythm ABD soft Neurologically intact MSK RLE Neurovascularly intact Sensation intact distally Feet warm Dorsiflexion/Plantar flexion intact Incision: dressing C/D/I   Prudencio Burly III, PA-C 09/27/2016, 7:40 AM

## 2016-09-27 NOTE — Progress Notes (Signed)
Pt ready for d/c home today per MD. Pt met PT/OT goals, equipment was delivered to pt's room. Discharge instructions and prescriptions reviewed with pt, all questions answered. Belongings gathered and will be sent with pt.  [place], Jerry Caras

## 2016-09-27 NOTE — Discharge Summary (Signed)
Discharge Summary  Patient ID: [name] MRN: [id number] DOB/AGE: April 17, 1955 61 y.o.  Admit date: 09/26/2016 Discharge date: 09/27/2016  Admission Diagnoses:  Primary osteoarthritis of right hip  Discharge Diagnoses:  Principal Problem:   Primary osteoarthritis of right hip Active Problems:   Hyperlipidemia with target LDL less than 130   Essential hypertension   Past Medical History:  Diagnosis Date  . Arthritis    right hip  . Diabetes mellitus    NO MEDS FOR TREATMENT FOR THIS PER PT.  . High cholesterol   . Hyperlipidemia   . Hypertension   . Lung nodule 03/2006   LLL 4-49mm  . Pre-diabetes     Surgeries: Procedure(s): TOTAL HIP ARTHROPLASTY ANTERIOR APPROACH on 09/26/2016   Consultants (if any):   Discharged Condition: Improved  Hospital Course: [name] is an 61 y.o. female who was admitted 09/26/2016 with a diagnosis of Primary osteoarthritis of right hip and went to the operating room on 09/26/2016 and underwent the above named procedures.    She was given perioperative antibiotics:  Anti-infectives    Start     Dose/Rate Route Frequency Ordered Stop   09/26/16 1700  ceFAZolin (ANCEF) IVPB 2g/100 mL premix     2 g 200 mL/hr over 30 Minutes Intravenous Every 6 hours 09/26/16 1533 09/26/16 2236   09/26/16 0715  ceFAZolin (ANCEF) IVPB 2g/100 mL premix     2 g 200 mL/hr over 30 Minutes Intravenous On call to O.R. 09/25/16 9935 09/26/16 0849    .  She was given sequential compression devices, early ambulation, and Aspirin 325 mg for DVT prophylaxis.  She benefited maximally from the hospital stay and there were no complications.    Recent vital signs:  Vitals:   09/26/16 2158 09/27/16 0640  BP: 134/69 125/61  Pulse: 90   Resp: 18   Temp: 98.6 F (37 C)   SpO2: 100% 100%    Recent laboratory studies:  Lab Results  Component Value Date   HGB 10.1 (L) 09/27/2016   HGB 11.9 (L) 09/15/2016   HGB 12.3 07/16/2015   Lab Results  Component  Value Date   WBC 7.3 09/27/2016   PLT 140 (L) 09/27/2016   No results found for: INR Lab Results  Component Value Date   NA 140 09/15/2016   K 4.0 09/15/2016   CL 111 09/15/2016   CO2 23 09/15/2016   BUN 17 09/15/2016   CREATININE 0.78 09/15/2016   GLUCOSE 102 (H) 09/15/2016    Discharge Medications:   Allergies as of 09/27/2016   No Known Allergies     Medication List    TAKE these medications   amLODipine 10 MG tablet Commonly known as:  NORVASC TAKE 1 TABLET BY MOUTH EVERY DAY (DUE FOR FOLLOW UP)   aspirin EC 325 MG tablet Take 1 tablet (325 mg total) by mouth daily. For 30 days post op for DVT Prophylaxis   atorvastatin 40 MG tablet Commonly known as:  LIPITOR TAKE 1 TABLET BY MOUTH EVERY DAY   baclofen 10 MG tablet Commonly known as:  LIORESAL Take 1 tablet (10 mg total) by mouth 3 (three) times daily as needed for muscle spasms.   CALTRATE 600+D 600-400 MG-UNIT tablet Generic drug:  Calcium Carbonate-Vitamin D Take 1 tablet by mouth daily.   docusate sodium 100 MG capsule Commonly known as:  COLACE Take 1 capsule (100 mg total) by mouth 2 (two) times daily. To prevent constipation while taking pain medication.  HYDROcodone-acetaminophen 5-325 MG tablet Commonly known as:  NORCO Take 1-2 tablets by mouth every 4 (four) hours as needed for moderate pain.   multivitamin with minerals tablet Take 1 tablet by mouth daily.   omeprazole 20 MG capsule Commonly known as:  PRILOSEC Take 1 capsule (20 mg total) by mouth daily. 30 days for gastroprotection while taking Aspirin.   ondansetron 4 MG tablet Commonly known as:  ZOFRAN Take 1 tablet (4 mg total) by mouth every 8 (eight) hours as needed for nausea or vomiting.   telmisartan 80 MG tablet Commonly known as:  MICARDIS Take 1 tablet (80 mg total) by mouth daily.            Discharge Care Instructions        Start     Ordered   09/26/16 0000  baclofen (LIORESAL) 10 MG tablet  3 times daily  PRN     09/26/16 1056   09/26/16 0000  aspirin EC 325 MG tablet  Daily     09/26/16 1056   09/26/16 0000  omeprazole (PRILOSEC) 20 MG capsule  Daily     09/26/16 1056   09/26/16 0000  docusate sodium (COLACE) 100 MG capsule  2 times daily     09/26/16 1056   09/26/16 0000  HYDROcodone-acetaminophen (NORCO) 5-325 MG tablet  Every 4 hours PRN     09/26/16 1056   09/26/16 0000  ondansetron (ZOFRAN) 4 MG tablet  Every 8 hours PRN     09/26/16 1056      Diagnostic Studies: Dg C-arm 61-120 Min  Result Date: 09/26/2016 CLINICAL DATA:  Right hip replacement EXAM: DG C-ARM 61-120 MIN; OPERATIVE RIGHT HIP WITH PELVIS COMPARISON:  None. FLUOROSCOPY TIME:  Fluoroscopy Time:  Not available Radiation Exposure Index (if provided by the fluoroscopic device): Not available Number of Acquired Spot Images: 2 FINDINGS: Right hip replacement is noted in satisfactory position. No acute bony or soft tissue abnormality is noted. IMPRESSION: Status post right hip replacement Electronically Signed   By: Inez Catalina M.D.   On: 09/26/2016 10:37   Dg Hip Operative Unilat W Or W/o Pelvis Right  Result Date: 09/26/2016 CLINICAL DATA:  Right hip replacement EXAM: DG C-ARM 61-120 MIN; OPERATIVE RIGHT HIP WITH PELVIS COMPARISON:  None. FLUOROSCOPY TIME:  Fluoroscopy Time:  Not available Radiation Exposure Index (if provided by the fluoroscopic device): Not available Number of Acquired Spot Images: 2 FINDINGS: Right hip replacement is noted in satisfactory position. No acute bony or soft tissue abnormality is noted. IMPRESSION: Status post right hip replacement Electronically Signed   By: Inez Catalina M.D.   On: 09/26/2016 10:37    Disposition: 01-Home or Self Care    Follow-up Information    Renette Butters, MD Follow up.   Specialty:  Orthopedic Surgery Contact information: [address] [id number] [phone]            Signed: Prudencio Burly III PA-C 09/27/2016,  7:45 AM

## 2016-09-27 NOTE — Progress Notes (Addendum)
Physical Therapy Treatment Patient Details Name: [name] MRN: [id number] DOB: 06-30-1955 Today's Date: 09/27/2016    History of Present Illness Patient is a 61 y/o female presents s/p right THA, direct anterior approach. PMH includes HTN, HLD, DM, lung nodule.    PT Comments    Pt performed increased gait and reviewed stair training in prep for d/c home.  PTA issued and reviewed HEP.  Pt tolerated tx well and ready for d/c home at this time.    Follow Up Recommendations  DC plan and follow up therapy as arranged by surgeon     Equipment Recommendations  Rolling walker with 5" wheels    Recommendations for Other Services       Precautions / Restrictions Precautions Precautions: Fall Restrictions Weight Bearing Restrictions: Yes RLE Weight Bearing: Weight bearing as tolerated    Mobility  Bed Mobility     General bed mobility comments: Pt sitting in recliner on arrival.    Transfers Overall transfer level: Needs assistance Equipment used: Rolling walker (2 wheeled) Transfers: Sit to/from Stand Sit to Stand: Supervision Stand pivot transfers: Supervision       General transfer comment: Pt demonstrated good technique w/ transfers and RW use. No physical assist or VC's required.  Ambulation/Gait Ambulation/Gait assistance: Supervision Ambulation Distance (Feet): 250 Feet Assistive device: Rolling walker (2 wheeled) Gait Pattern/deviations: Step-through pattern;Decreased stride length Gait velocity: decreased Gait velocity interpretation: Below normal speed for age/gender General Gait Details: Cues for uppe trunk control and RW safety.  Pt with good carryover with step through pattern.     Stairs Stairs: Yes   Stair Management: Two rails;Forwards;Step to pattern Number of Stairs: 2 General stair comments: Cues for sequencing and use of rails.  Pt educated on having family place RW at the top and bottom of the two stairs to ease transition back to walker  after negotiating stairs.    Wheelchair Mobility    Modified Rankin (Stroke Patients Only)       Balance Overall balance assessment: Needs assistance Sitting-balance support: Feet supported;No upper extremity supported Sitting balance-Leahy Scale: Good     Standing balance support: No upper extremity supported;During functional activity Standing balance-Leahy Scale: Good Standing balance comment: Pt stood at sink for grooming tasks without UE support on RW or countertop.                             Cognition Arousal/Alertness: Awake/alert Behavior During Therapy: WFL for tasks assessed/performed Overall Cognitive Status: Within Functional Limits for tasks assessed                                        Exercises Total Joint Exercises Ankle Circles/Pumps: Both;10 reps;Supine Quad Sets: 10 reps;Supine;Right;AROM Short Arc Quad: AROM;Right;10 reps;Supine Heel Slides: AAROM;Right;10 reps;Supine Hip ABduction/ADduction: AROM;Right;Standing;20 reps;Supine (1x10 in standing and 1x10 in supine.  ) Long Arc Quad: AROM;Right;10 reps;Seated Knee Flexion: AROM;Right;10 reps;Standing Marching in Standing: AROM;Right;10 reps;Standing Standing Hip Extension: AROM;Right;10 reps;Standing    General Comments        Pertinent Vitals/Pain Pain Assessment: No/denies pain Pain Score: 8  Pain Location: right hip Pain Descriptors / Indicators: Sore;Operative site guarding Pain Intervention(s): Monitored during session;Repositioned;Ice applied    Home Living Family/patient expects to be discharged to:: Private residence Living Arrangements: Alone Available Help at Discharge: Family;Available PRN/intermittently Type of Home: House Home Access:  Stairs to enter Entrance Stairs-Rails: Right Home Layout: Two level;Able to live on main level with bedroom/bathroom Home Equipment: None      Prior Function Level of Independence: Independent      Comments:  Works, drives.    PT Goals (current goals can now be found in the care plan section) Acute Rehab PT Goals Patient Stated Goal: Go home today Potential to Achieve Goals: Good Progress towards PT goals: Progressing toward goals    Frequency    7X/week      PT Plan Current plan remains appropriate    Co-evaluation              AM-PAC PT "6 Clicks" Daily Activity  Outcome Measure  Difficulty turning over in bed (including adjusting bedclothes, sheets and blankets)?: None Difficulty moving from lying on back to sitting on the side of the bed? : Unable Difficulty sitting down on and standing up from a chair with arms (e.g., wheelchair, bedside commode, etc,.)?: Unable Help needed moving to and from a bed to chair (including a wheelchair)?: A Little Help needed walking in hospital room?: A Little Help needed climbing 3-5 steps with a railing? : A Little 6 Click Score: 15    End of Session Equipment Utilized During Treatment: Gait belt Activity Tolerance: Patient tolerated treatment well Patient left: in chair;with call bell/phone within reach Nurse Communication: Mobility status PT Visit Diagnosis: Pain;Difficulty in walking, not elsewhere classified (R26.2) Pain - Right/Left: Right Pain - part of body: Hip     Time: 7672-0947 PT Time Calculation (min) (ACUTE ONLY): 18 min  Charges:  $Gait Training: 8-22 mins                    G Codes:       Governor Rooks, PTA pager [phone]    Cristela Blue 09/27/2016, 11:37 AM

## 2016-09-27 NOTE — Evaluation (Signed)
Occupational Therapy Evaluation Patient Details Name: [name] MRN: [id number] DOB: 1955/09/06 Today's Date: 09/27/2016    History of Present Illness Patient is a 61 y/o female presents s/p right THA, direct anterior approach. PMH includes HTN, HLD, DM, lung nodule.   Clinical Impression   Pt admitted as above. She was educated in & performed LB ADL's/transfers and was issued handouts that were reviewed as well. She currently demonstrates overall supervision-min guard assist for tub transfers using RW & 3:1 and supervision-Mod I for functional mobility, toilet transfer etc. DME has been delivered to her room and she plans to d/c home today with PRN family assist. Pt denies any further needs at this time, will sign off acute OT.    Follow Up Recommendations  No OT follow up;Supervision - Intermittent    Equipment Recommendations  3 in 1 bedside commode    Recommendations for Other Services       Precautions / Restrictions Precautions Precautions: Fall Restrictions Weight Bearing Restrictions: Yes RLE Weight Bearing: Weight bearing as tolerated      Mobility Bed Mobility Overal bed mobility: Needs Assistance Bed Mobility: Supine to Sit     Supine to sit: Modified independent (Device/Increase time);HOB elevated     General bed mobility comments: No physical assist  Transfers Overall transfer level: Needs assistance Equipment used: Rolling walker (2 wheeled) Transfers: Sit to/from [employer] Sit to Stand: Supervision Stand pivot transfers: Supervision       General transfer comment: Pt demonstrated good technique w/ transfers and RW use. No physical assist or VC's required.    Balance Overall balance assessment: Needs assistance Sitting-balance support: Feet supported;No upper extremity supported Sitting balance-Leahy Scale: Good     Standing balance support: No upper extremity supported;During functional activity Standing balance-Leahy  Scale: Good Standing balance comment: Pt stood at sink for grooming tasks without UE support on RW or countertop.                            ADL either performed or assessed with clinical judgement   ADL Overall ADL's : Needs assistance/impaired Eating/Feeding: Independent;Sitting   Grooming: Wash/dry hands;Wash/dry face;Oral care;Standing;Supervision/safety   Upper Body Bathing: Sitting;Independent;Set up   Lower Body Bathing: Min guard;Sit to/from stand   Upper Body Dressing : Independent;Sitting   Lower Body Dressing: Min guard;Sit to/from stand   Toilet Transfer: Supervision/safety;Ambulation;RW;BSC Toilet Transfer Details (indicate cue type and reason): 3:1 over toilet in bathroom. Discussed set-up at home [place] and Hygiene: Sitting/lateral lean;Modified independent;Supervision/safety;Sit to/from stand   Tub/ Shower Transfer: Tub transfer;Supervision/safety;Ambulation;3 in 1;Rolling walker Tub/Shower Transfer Details (indicate cue type and reason): Pt demonstrated tub transfer in ortho gym using 3:1 and good technique at supervision level. Pt family member present throughout and verbalized understanding as well. Functional mobility during ADLs: Modified independent;Supervision/safety;Rolling walker General ADL Comments: Pt was educated in Role of OT and evaluation was performed follwoed by participation in ADL retraining session. Pt was Mod I bed mobility and supervision sit to stand. She ambulated from her room into Ortho Gym and was educated/performed tub transfer using 3:1 - pt demonstrated good technique and did not require any hands on assistance. Handout was issued and reviewed. Pt then ambulated back to her room and into bathroom - supervision for toilet transfer and Mod I hygiene. She then stood at sink for grooming at [place] I level followed by transfer to recliner chair. Discussed and educated pt on LB dressing  techniques and  handout issued and reviewed as well. Pt verbalized understanding of all of the above and plans to d/c home later today with PRN family assistance.     Vision Baseline Vision/History: Wears glasses Wears Glasses: Reading only Patient Visual Report: No change from baseline       Perception     Praxis      Pertinent Vitals/Pain Pain Assessment: No/denies pain     Hand Dominance Right   Extremity/Trunk Assessment Upper Extremity Assessment Upper Extremity Assessment: Overall WFL for tasks assessed   Lower Extremity Assessment Lower Extremity Assessment: Defer to PT evaluation       Communication Communication Communication: No difficulties   Cognition Arousal/Alertness: Awake/alert Behavior During Therapy: WFL for tasks assessed/performed Overall Cognitive Status: Within Functional Limits for tasks assessed                                     General Comments       Exercises     Shoulder Instructions      Home Living Family/patient expects to be discharged to:: Private residence Living Arrangements: Alone Available Help at Discharge: Family;Available PRN/intermittently Type of Home: House Home Access: Stairs to enter [employer] of Steps: 4-5 Entrance Stairs-Rails: Right Home Layout: Two level;Able to live on main level with bedroom/bathroom   Alternate Level Stairs-Rails: Right Bathroom Shower/Tub: Tub/shower unit;Curtain   Bathroom Toilet: Standard Bathroom Accessibility: Yes How Accessible: Accessible via walker Home Equipment: None          Prior Functioning/Environment Level of Independence: Independent        Comments: Works, drives.         OT Problem List:        OT Treatment/Interventions:      OT Goals(Current goals can be found in the care plan section) Acute Rehab OT Goals Patient Stated Goal: Go home today OT Goal Formulation: All assessment and education complete, DC therapy  OT Frequency:      Barriers to D/C:            Co-evaluation              AM-PAC PT "6 Clicks" Daily Activity     Outcome Measure Help from another person eating meals?: None Help from another person taking care of personal grooming?: A Little Help from another person toileting, which includes using toliet, bedpan, or urinal?: A Little Help from another person bathing (including washing, rinsing, drying)?: A Little Help from another person to put on and taking off regular upper body clothing?: None Help from another person to put on and taking off regular lower body clothing?: A Little 6 Click Score: 20   End of Session Equipment Utilized During Treatment: Gait belt;Rolling walker  Activity Tolerance: Patient tolerated treatment well Patient left: in chair;with call bell/phone within reach;with family/visitor present                   Time: 4403-4742 OT Time Calculation (min): 29 min Charges:  OT Evaluation $OT Eval Low Complexity: 1 Low OT Treatments $Self Care/Home Management : 8-22 mins G-Codes:      Josephine Igo Dixon, OTR/L 09/27/2016, 10:25 AM

## 2016-09-27 NOTE — Care Management Note (Signed)
Case Management Note  Patient Details  Name: [name] MRN: [id number] Date of Birth: 08-04-1955  Subjective/Objective:  62 yr old female s/p right total hip arthroplasty, anterior approach.                   Action/Plan: Patient was preoperatively setup with [facility], no changes. Will have assistance at discharge.   Expected Discharge Date:  09/27/16               Expected Discharge Plan:  [place]  In-House Referral:  NA  Discharge planning Services  CM Consult  Post Acute Care Choice:  Durable Medical Equipment, Home Health Choice offered to:  Patient  DME Arranged:  3-N-1, Walker rolling DME Agency:  [employer]  HH Arranged:  PT [place]:  [facility] [facility] [employer]  Status of Service:  Completed, signed off  If discussed at [employer], dates discussed:    Additional Comments:  Ninfa Meeker, RN 09/27/2016, 12:18 PM

## 2016-09-29 ENCOUNTER — Telehealth: Payer: Self-pay

## 2016-09-29 ENCOUNTER — Telehealth: Payer: Self-pay | Admitting: Neurology

## 2016-09-29 ENCOUNTER — Ambulatory Visit: Payer: Managed Care, Other (non HMO) | Admitting: Neurology

## 2016-09-29 NOTE — Telephone Encounter (Deleted)
Patient was no show for new patient appt today.

## 2016-09-29 NOTE — Telephone Encounter (Deleted)
Revised. 

## 2016-09-29 NOTE — Telephone Encounter (Signed)
This patient did not show for a new patient on today.

## 2016-10-02 ENCOUNTER — Encounter: Payer: Self-pay | Admitting: Neurology

## 2016-10-17 ENCOUNTER — Other Ambulatory Visit: Payer: Self-pay | Admitting: *Deleted

## 2016-10-17 NOTE — Patient Outreach (Signed)
[facility]) Care Management  10/17/2016  [name] 1955/04/22 [id number]   Subjective:Telephone call to patient's home number, spoke with patient, and HIPAA verified.  Discussed [facility] Care Management [employer] Transition of care follow up, patient voiced understanding, and is in agreement to follow up.   Patient states to hold for one moment, call disconnected, called patient back, went to voicemail, message states mailbox full, and unable to leave a message.     Objective:Per KPN (Knowledge Performance Now, point of care tool), [employer] iCollaborate, and chart review, patient hospitalized 09/26/16 -09/27/16 for Primary osteoarthritis of right hip.    Status post TOTAL HIP ARTHROPLASTY ANTERIOR APPROACH on 09/26/16.   Patient also has a history of hyperlipidemia, hypertension, and diabetes.    Assessment: Received [employer] Transition of care referral on 10/03/16.  Transition of care follow up pending patient contact.     Plan: RNCM will call patient for 2nd telephone outreach attempt, transition of care follow up, within 10 business days if no return call.     Katrisha Segall H. Annia Friendly, BSN, [place] Management [facility] Telephonic CM Phone: [phone] Fax: [phone]

## 2016-10-19 ENCOUNTER — Other Ambulatory Visit: Payer: Self-pay | Admitting: *Deleted

## 2016-10-19 NOTE — Patient Outreach (Signed)
[facility]) Care Management  10/19/2016  [name] Aug 24, 1955 [id number]   Subjective: Telephone call to patient's home  / mobile number, no answer, message states voicemail full, and unable to accept messages.   Objective:Per KPN (Knowledge Performance Now, point of care tool), [employer] iCollaborate, and chart review, patient hospitalized 09/26/16 -09/27/16 for Primary osteoarthritis of right hip.    Status post TOTAL HIP ARTHROPLASTY ANTERIOR APPROACH on 09/26/16.   Patient also has a history of hyperlipidemia, hypertension, and diabetes.    Assessment: Received [employer] Transition of care referral on 10/03/16.  Transition of care follow up pending patient contact.     Plan: RNCM will call patient for 3rd telephone outreach attempt, transition of care follow up, within 10 business days if no return call.     Mashal Slavick H. Annia Friendly, BSN, [place] Management [facility] Telephonic CM Phone: [phone] Fax: [phone]

## 2016-10-23 ENCOUNTER — Other Ambulatory Visit: Payer: Self-pay | Admitting: *Deleted

## 2016-10-23 ENCOUNTER — Encounter: Payer: Self-pay | Admitting: *Deleted

## 2016-10-23 NOTE — Patient Outreach (Signed)
[facility]) Care Management  10/23/2016  [name] 23-Jun-1955 [id number]   Subjective: Telephone call to patient's home / mobile number, spoke with patient, and HIPAA verified. Discussed [facility] Care Management [employer] Transition of care follow up, patient voiced understanding, and is in agreement to follow up.   Patient states she is doing well, receiving home health therapy, had follow up with appointment with surgeon on 10/11/16, and the appointment went well.   States she has been having problems obtaining follow up appointments with primary MD and requested assistance.  Did conference call with primary MD's office [facility]) and patient, follow up appointment scheduled for 11/06/16. Patient voices understanding of medical diagnosis, surgery, and treatment plan.  States she is accessing her Christella Scheuermann benefits as needed via member services number on back of card or through [email or web address] Patient states she does not have any education material, transition of care, care coordination, disease management, disease monitoring, transportation, community resource, or pharmacy needs at this time.  States she is very appreciative of the follow up and is in agreement to receive [place] Management information.   Objective:Per KPN (Knowledge Performance Now, point of care tool), [employer] iCollaborate, and chart review, patient hospitalized 09/26/16 -09/27/16 for Primary osteoarthritis of right hip. Status post TOTAL HIP ARTHROPLASTY ANTERIOR APPROACH on 09/26/16. Patient also has a history of hyperlipidemia, hypertension, and diabetes.    Assessment: Received [employer] Transition of care referral on 10/03/16. Transition of care follow up completed, no care management needs, and will proceed with case closure.    Plan: RNCM will send patient successful outreach letter, [facility] pamphlet, and magnet. RNCM will send case closure due to follow up completed / no care management needs request to [facility] [place] [facility].     Daijanae Rafalski H. Annia Friendly, BSN, [place] Telephonic CM Phone: [phone]

## 2016-11-06 ENCOUNTER — Ambulatory Visit: Payer: Managed Care, Other (non HMO) | Admitting: Internal Medicine

## 2017-02-19 ENCOUNTER — Other Ambulatory Visit (INDEPENDENT_AMBULATORY_CARE_PROVIDER_SITE_OTHER): Payer: 59

## 2017-02-19 ENCOUNTER — Encounter: Payer: Self-pay | Admitting: Internal Medicine

## 2017-02-19 ENCOUNTER — Ambulatory Visit (INDEPENDENT_AMBULATORY_CARE_PROVIDER_SITE_OTHER): Payer: 59 | Admitting: Internal Medicine

## 2017-02-19 VITALS — BP 138/88 | HR 70 | Temp 97.6°F | Resp 16 | Ht 67.25 in | Wt 217.5 lb

## 2017-02-19 DIAGNOSIS — I10 Essential (primary) hypertension: Secondary | ICD-10-CM | POA: Diagnosis not present

## 2017-02-19 DIAGNOSIS — M5441 Lumbago with sciatica, right side: Secondary | ICD-10-CM | POA: Diagnosis not present

## 2017-02-19 DIAGNOSIS — Z0000 Encounter for general adult medical examination without abnormal findings: Secondary | ICD-10-CM | POA: Diagnosis not present

## 2017-02-19 DIAGNOSIS — D539 Nutritional anemia, unspecified: Secondary | ICD-10-CM | POA: Diagnosis not present

## 2017-02-19 DIAGNOSIS — M1610 Unilateral primary osteoarthritis, unspecified hip: Secondary | ICD-10-CM

## 2017-02-19 DIAGNOSIS — M25512 Pain in left shoulder: Secondary | ICD-10-CM

## 2017-02-19 DIAGNOSIS — E785 Hyperlipidemia, unspecified: Secondary | ICD-10-CM | POA: Diagnosis not present

## 2017-02-19 DIAGNOSIS — E118 Type 2 diabetes mellitus with unspecified complications: Secondary | ICD-10-CM

## 2017-02-19 DIAGNOSIS — G8929 Other chronic pain: Secondary | ICD-10-CM

## 2017-02-19 LAB — LIPID PANEL
CHOLESTEROL: 226 mg/dL — AB (ref 0–200)
HDL: 42.4 mg/dL (ref >39.00)
LDL Cholesterol: 170 mg/dL — ABNORMAL HIGH (ref 0–99)
NONHDL: 183.23
TRIGLYCERIDES: 65 mg/dL (ref 0.0–149.0)
Total CHOL/HDL Ratio: 5
VLDL: 13 mg/dL (ref 0.0–40.0)

## 2017-02-19 LAB — CBC WITH DIFFERENTIAL/PLATELET
BASOS PCT: 0.4 % (ref 0.0–3.0)
Basophils Absolute: 0 10*3/uL (ref 0.0–0.1)
EOS PCT: 1.6 % (ref 0.0–5.0)
Eosinophils Absolute: 0.1 10*3/uL (ref 0.0–0.7)
HCT: 37.1 % (ref 36.0–46.0)
Hemoglobin: 12.1 g/dL (ref 12.0–15.0)
LYMPHS ABS: 2.3 10*3/uL (ref 0.7–4.0)
Lymphocytes Relative: 34.7 % (ref 12.0–46.0)
MCHC: 32.8 g/dL (ref 30.0–36.0)
MCV: 78.2 fl (ref 78.0–100.0)
MONOS PCT: 6.8 % (ref 3.0–12.0)
Monocytes Absolute: 0.4 10*3/uL (ref 0.1–1.0)
NEUTROS PCT: 56.5 % (ref 43.0–77.0)
Neutro Abs: 3.7 10*3/uL (ref 1.4–7.7)
Platelets: 207 10*3/uL (ref 150.0–400.0)
RBC: 4.74 Mil/uL (ref 3.87–5.11)
RDW: 16.7 % — AB (ref 11.5–15.5)
WBC: 6.6 10*3/uL (ref 4.0–10.5)

## 2017-02-19 LAB — COMPREHENSIVE METABOLIC PANEL
ALBUMIN: 4.2 g/dL (ref 3.5–5.2)
ALK PHOS: 59 U/L (ref 39–117)
ALT: 12 U/L (ref 0–35)
AST: 14 U/L (ref 0–37)
BUN: 16 mg/dL (ref 6–23)
CO2: 25 mEq/L (ref 19–32)
Calcium: 9.5 mg/dL (ref 8.4–10.5)
Chloride: 106 mEq/L (ref 96–112)
Creatinine, Ser: 0.67 mg/dL (ref 0.40–1.20)
GFR: 115.51 mL/min (ref >60.00)
Glucose, Bld: 90 mg/dL (ref 70–99)
POTASSIUM: 3.8 meq/L (ref 3.5–5.1)
Sodium: 139 mEq/L (ref 135–145)
TOTAL PROTEIN: 7.7 g/dL (ref 6.0–8.3)
Total Bilirubin: 0.5 mg/dL (ref 0.2–1.2)

## 2017-02-19 LAB — VITAMIN B12: VITAMIN B 12: 314 pg/mL (ref 211–911)

## 2017-02-19 LAB — POCT GLYCOSYLATED HEMOGLOBIN (HGB A1C): Hemoglobin A1C: 6

## 2017-02-19 LAB — FERRITIN: Ferritin: 30.3 ng/mL (ref 10.0–291.0)

## 2017-02-19 LAB — IBC PANEL
Iron: 72 ug/dL (ref 42–145)
Saturation Ratios: 18.9 % — ABNORMAL LOW (ref 20.0–50.0)
Transferrin: 272 mg/dL (ref 212.0–360.0)

## 2017-02-19 LAB — TSH: TSH: 1.34 u[IU]/mL (ref 0.35–4.50)

## 2017-02-19 LAB — FOLATE: Folate: 12.1 ng/mL (ref >5.9)

## 2017-02-19 MED ORDER — MELOXICAM 15 MG PO TABS: 15.0000 mg | ORAL_TABLET | Freq: Every day | ORAL | 0 refills | Status: DC

## 2017-02-19 MED ORDER — ATORVASTATIN CALCIUM 40 MG PO TABS
ORAL_TABLET | ORAL | 1 refills | Status: DC
Start: 2017-02-19 — End: 2018-02-28

## 2017-02-19 NOTE — Progress Notes (Signed)
Subjective:  Patient ID: [name], female    DOB: [birth year]  Age: 62 y.o. MRN: [id number]  CC: Anemia; Hypertension; Diabetes; Annual Exam; and Osteoarthritis   HPI Alexzia L Kratky presents for a CPX.  She is several months status post total right hip replacement and is now complaining of left hip pain as well as left shoulder pain with decreased range of motion in her left shoulder.  She describes the pain as a stiffness.  She is not taking anything for the pain.  She denies any recent trauma or injury.  She tells me her blood pressure has been well controlled.  She has had no recent episodes of headache, blurred vision, chest pain, shortness of breath, palpitations, edema, or fatigue.  She stopped taking atorvastatin several months ago.  She does not know why.  She does not think it caused any side effects.  Outpatient Medications Prior to Visit  Medication Sig Dispense Refill  . amLODipine (NORVASC) 10 MG tablet TAKE 1 TABLET BY MOUTH EVERY DAY (DUE FOR FOLLOW UP) 90 tablet 1  . aspirin EC 325 MG tablet Take 1 tablet (325 mg total) by mouth daily. For 30 days post op for DVT Prophylaxis 30 tablet 0  . Calcium Carbonate-Vitamin D (CALTRATE 600+D) 600-400 MG-UNIT per tablet Take 1 tablet by mouth daily.     . Multiple Vitamins-Minerals (MULTIVITAMIN WITH MINERALS) tablet Take 1 tablet by mouth daily.    Marland Kitchen omeprazole (PRILOSEC) 20 MG capsule Take 1 capsule (20 mg total) by mouth daily. 30 days for gastroprotection while taking Aspirin. 30 capsule 0  . telmisartan (MICARDIS) 80 MG tablet Take 1 tablet (80 mg total) by mouth daily. 90 tablet 1  . baclofen (LIORESAL) 10 MG tablet Take 1 tablet (10 mg total) by mouth 3 (three) times daily as needed for muscle spasms. 40 each 0  . docusate sodium (COLACE) 100 MG capsule Take 1 capsule (100 mg total) by mouth 2 (two) times daily. To prevent constipation while taking pain medication. 60 capsule 0  . HYDROcodone-acetaminophen (NORCO) 5-325 MG  tablet Take 1-2 tablets by mouth every 4 (four) hours as needed for moderate pain. 40 tablet 0  . ondansetron (ZOFRAN) 4 MG tablet Take 1 tablet (4 mg total) by mouth every 8 (eight) hours as needed for nausea or vomiting. 40 tablet 0  . atorvastatin (LIPITOR) 40 MG tablet TAKE 1 TABLET BY MOUTH EVERY DAY 90 tablet 1   No facility-administered medications prior to visit.     ROS Review of Systems  Constitutional: Negative.  Negative for appetite change, diaphoresis, fatigue and unexpected weight change.  HENT: Negative.  Negative for sinus pressure and trouble swallowing.   Eyes: Negative for visual disturbance.  Respiratory: Negative for cough, chest tightness, shortness of breath and wheezing.   Cardiovascular: Negative.  Negative for chest pain, palpitations and leg swelling.  Gastrointestinal: Negative for abdominal pain, constipation, diarrhea, nausea and vomiting.  Endocrine: Negative.   Genitourinary: Negative.  Negative for difficulty urinating, dysuria and hematuria.  Musculoskeletal: Positive for arthralgias. Negative for back pain, myalgias and neck pain.  Skin: Negative.  Negative for color change and pallor.  Allergic/Immunologic: Negative.   Neurological: Negative.  Negative for dizziness, weakness and light-headedness.  Hematological: Negative for adenopathy. Does not bruise/bleed easily.  Psychiatric/Behavioral: Negative.     Objective:  BP 138/88 (BP Location: Left Arm, Patient Position: Sitting, Cuff Size: Large)   Pulse 70   Temp 97.6 F (36.4 C) (Oral)  Resp 16   Ht 5' 7.25" (1.708 m)   Wt 217 lb 8 oz (98.7 kg)   LMP 07/18/2012   SpO2 99%   BMI 33.81 kg/m   BP Readings from Last 3 Encounters:  02/19/17 138/88  09/27/16 (!) 156/90  09/15/16 137/71    Wt Readings from Last 3 Encounters:  02/19/17 217 lb 8 oz (98.7 kg)  09/26/16 214 lb 8 oz (97.3 kg)  09/15/16 214 lb 8 oz (97.3 kg)    Physical Exam  Constitutional: She is oriented to person, place,  and time. No distress.  HENT:  Mouth/Throat: Oropharynx is clear and moist. No oropharyngeal exudate.  Eyes: Conjunctivae are normal. Left eye exhibits no discharge. No scleral icterus.  Neck: Normal range of motion. Neck supple. No JVD present. No thyromegaly present.  Cardiovascular: Normal rate, regular rhythm and normal heart sounds. Exam reveals no gallop.  No murmur heard. Pulmonary/Chest: Effort normal and breath sounds normal. No respiratory distress. She has no wheezes. She has no rales.  Abdominal: Soft. She exhibits no distension and no mass. There is no tenderness. There is no guarding.  Musculoskeletal: She exhibits no edema, tenderness or deformity.       Left shoulder: She exhibits decreased range of motion. She exhibits no tenderness, no bony tenderness, no swelling, no effusion and no deformity.  Lymphadenopathy:    She has no cervical adenopathy.  Neurological: She is alert and oriented to person, place, and time.  Skin: Skin is warm and dry. No rash noted. She is not diaphoretic. No erythema. No pallor.  Psychiatric: She has a normal mood and affect. Her behavior is normal. Judgment and thought content normal.  Vitals reviewed.   Lab Results  Component Value Date   WBC 6.6 02/19/2017   HGB 12.1 02/19/2017   HCT 37.1 02/19/2017   PLT 207.0 02/19/2017   GLUCOSE 90 02/19/2017   CHOL 226 (H) 02/19/2017   TRIG 65.0 02/19/2017   HDL 42.40 02/19/2017   LDLDIRECT 167.9 11/09/2010   LDLCALC 170 (H) 02/19/2017   ALT 12 02/19/2017   AST 14 02/19/2017   NA 139 02/19/2017   K 3.8 02/19/2017   CL 106 02/19/2017   CREATININE 0.67 02/19/2017   BUN 16 02/19/2017   CO2 25 02/19/2017   TSH 1.34 02/19/2017   HGBA1C 6.0 02/19/2017   MICROALBUR 2.4 (H) 08/01/2016    No results found.  Assessment & Plan:   Yarethzi was seen today for anemia, hypertension, diabetes, annual exam and osteoarthritis.  Diagnoses and all orders for this visit:  Essential hypertension- Her blood  pressure is well controlled.  Electrolytes and renal function are normal. -     Comprehensive metabolic panel; Future -     TSH; Future  Type 2 diabetes mellitus with complication, without long-term current use of insulin ([place])- Her A1c is at 6.0%.  Her blood sugars are well controlled.  Medical therapy is not indicated. -     Comprehensive metabolic panel; Future -     POCT glycosylated hemoglobin (Hb A1C) -     atorvastatin (LIPITOR) 40 MG tablet; TAKE 1 TABLET BY MOUTH EVERY DAY  Chronic right-sided low back pain with right-sided sciatica- Will start an NSAID to treat this.  DJD (degenerative joint disease) of pelvis -     meloxicam (MOBIC) 15 MG tablet; Take 1 tablet (15 mg total) by mouth daily.  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  I will monitor for vitamin deficiencies. -  CBC with Differential/Platelet; Future -     IBC panel; Future -     Vitamin B12; Future -     Ferritin; Future -     Folate; Future  Routine general medical examination at a health care facility- Exam completed, labs reviewed, vaccines reviewed, screening for colon cancer, mammogram and Pap smear are up-to-date.  Patient education material was given. -     Lipid panel; Future  Chronic left shoulder pain- She may have developed an impingement syndrome.  I have asked her to see sports medicine about this. -     Ambulatory referral to Sports Medicine  Hyperlipidemia with target LDL less than 130- She has an elevated ASCVD risk score.  I have asked her to restart the statin for CV risk reduction. -     atorvastatin (LIPITOR) 40 MG tablet; TAKE 1 TABLET BY MOUTH EVERY DAY   I have discontinued Kaianna L. Kleist's baclofen, docusate sodium, HYDROcodone-acetaminophen, and ondansetron. I am also having her start on meloxicam. Additionally, I am having her maintain her Calcium Carbonate-Vitamin D, amLODipine, telmisartan, multivitamin with minerals, aspirin EC, omeprazole, and atorvastatin.  Meds ordered  this encounter  Medications  . meloxicam (MOBIC) 15 MG tablet    Sig: Take 1 tablet (15 mg total) by mouth daily.    Dispense:  90 tablet    Refill:  0  . atorvastatin (LIPITOR) 40 MG tablet    Sig: TAKE 1 TABLET BY MOUTH EVERY DAY    Dispense:  90 tablet    Refill:  1     Follow-up: Return in about 4 months (around 06/19/2017).  Scarlette Calico, MD

## 2017-02-19 NOTE — Progress Notes (Signed)
Getting notes for mammogram and eye exam.

## 2017-02-19 NOTE — Patient Instructions (Signed)

## 2017-02-20 ENCOUNTER — Encounter: Payer: Self-pay | Admitting: Internal Medicine

## 2017-03-15 NOTE — Progress Notes (Signed)
[facility] [place], [place] [address] Phone: [phone] Subjective:    I'm seeing this patient by the request  of:  Janith Lima, MD   CC: Left shoulder pain  WSF:[id number]  [name] is a 62 y.o. female coming in with complaint of left shoulder pain.   Onset- Chronic  Location- Joint  Duration- consistent pain Character- Dull, throbbing Aggravating factors- Laying, grip strength Reliving factors- Oral meds.  Therapies tried-  Severity-8 out of 10     Past Medical History:  Diagnosis Date  . Arthritis    right hip  . Diabetes mellitus    NO MEDS FOR TREATMENT FOR THIS PER PT.  . High cholesterol   . Hyperlipidemia   . Hypertension   . Lung nodule 03/2006   LLL 4-90mm  . Pre-diabetes    Past Surgical History:  Procedure Laterality Date  . COLONOSCOPY    . OVARIAN BIOPSY    . TOTAL HIP ARTHROPLASTY Right 09/26/2016  . TOTAL HIP ARTHROPLASTY Right 09/26/2016   Procedure: TOTAL HIP ARTHROPLASTY ANTERIOR APPROACH;  Surgeon: Renette Butters, MD;  Location: [facility];  Service: Orthopedics;  Laterality: Right;  . TUBAL LIGATION     Social History   Socioeconomic History  . Marital status: Divorced    Spouse name: Not on file  . Number of children: Not on file  . Years of education: Not on file  . Highest education level: Not on file  Social Needs  . Financial resource strain: Not on file  . Food insecurity - worry: Not on file  . Food insecurity - inability: Not on file  . Transportation needs - medical: Not on file  . Transportation needs - non-medical: Not on file  Occupational History  . Not on file  Tobacco Use  . Smoking status: Never Smoker  . Smokeless tobacco: Never Used  Substance and Sexual Activity  . Alcohol use: No  . Drug use: No  . Sexual activity: Not Currently    Birth control/protection: Surgical  Other Topics Concern  . Not on file  Social History Narrative   Divorced- 3 grown  children   Laid off from work in July of 2009   No Known Allergies Family History  Problem Relation Age of Onset  . Diabetes Brother   . Cancer Other        pancreatic cancer  . Colon cancer Neg Hx   . Rectal cancer Neg Hx   . Stomach cancer Neg Hx      Past medical history, social, surgical and family history all reviewed in electronic medical record.  No pertanent information unless stated regarding to the chief complaint.   Review of Systems:Review of systems updated and as accurate as of 03/15/17  No headache, visual changes, nausea, vomiting, diarrhea, constipation, dizziness, abdominal pain, skin rash, fevers, chills, night sweats, weight loss, swollen lymph nodes, body aches, joint swelling, muscle aches, chest pain, shortness of breath, mood changes.   Objective  Last menstrual period 07/18/2012. Systems examined below as of 03/15/17   General: No apparent distress alert and oriented x3 mood and affect normal, dressed appropriately.  HEENT: Pupils equal, extraocular movements intact  Respiratory: Patient's speak in full sentences and does not appear short of breath  Cardiovascular: No lower extremity edema, non tender, no erythema  Skin: Warm dry intact with no signs of infection or rash on extremities or on axial skeleton.  Abdomen: Soft nontender  Neuro: Cranial nerves II through XII are intact, neurovascularly intact in all extremities with 2+ DTRs and 2+ pulses.  Lymph: No lymphadenopathy of posterior or anterior cervical chain or axillae bilaterally.  Gait normal with good balance and coordination.  MSK:  Non tender with full range of motion and good stability and symmetric strength and tone of  elbows, wrist, hip, knee and ankles bilaterally.  Shoulder: left Inspection reveals no abnormalities, atrophy or asymmetry. Palpation is normal with no tenderness over AC joint or bicipital groove. ROM is full in all planes passively. Rotator cuff strength normal  throughout. signs of impingement with positive Neer and Hawkin's tests, but negative empty can sign. Speeds and Yergason's tests normal. No labral pathology noted with negative Obrien's, negative clunk and good stability. Normal scapular function observed. No painful arc and no drop arm sign. No apprehension sign  MSK US performed of: left This study was ordered, performed, and interpreted by Charlann Boxer D.O.  Shoulder:   Supraspinatus:  Appears normal on long and transverse views, Bursal bulge seen with shoulder abduction on impingement view.  Thickening of the capsule noted Infraspinatus:  Appears normal on long and transverse views. Significant increase in Doppler flow Subscapularis:  Appears normal on long and transverse views. Positive bursa Teres Minor:  Appears normal on long and transverse views. AC joint:  Capsule undistended, no geyser sign.  Thickening of the anterior superior capsule noted as well in this view Glenohumeral Joint:  Appears normal without effusion. Glenoid Labrum:  Intact without visualized tears. Biceps Tendon:  Appears normal on long and transverse views, no fraying of tendon, tendon located in intertubercular groove, no subluxation with shoulder internal or external rotation.  Impression: Subacromial bursitis early frozen shoulder  Procedure: Real-time Ultrasound Guided Injection of left glenohumeral joint Device: GE Logiq E  Ultrasound guided injection is preferred based studies that show increased duration, increased effect, greater accuracy, decreased procedural pain, increased response rate with ultrasound guided versus blind injection.  Verbal informed consent obtained.  Time-out conducted.  Noted no overlying erythema, induration, or other signs of local infection.  Skin prepped in a sterile fashion.  Local anesthesia: Topical Ethyl chloride.  With sterile technique and under real time ultrasound guidance:  Joint visualized.  23g 1  inch needle  inserted posterior approach. Pictures taken for needle placement. Patient did have injection of 2 cc of 1% lidocaine, 2 cc of 0.5% Marcaine, and 1.0 cc of Kenalog 40 mg/dL. Completed without difficulty  Pain immediately resolved suggesting accurate placement of the medication.  Advised to call if fevers/chills, erythema, induration, drainage, or persistent bleeding.  Images permanently stored and available for review in the ultrasound unit.  Impression: Technically successful ultrasound guided injection.   [id number]; 15 additional minutes spent for Therapeutic exercises as stated in above notes.  This included exercises focusing on stretching, strengthening, with significant focus on eccentric aspects.   Long term goals include an improvement in range of motion, strength, endurance as well as avoiding reinjury. Patient's frequency would include in 1-2 times a day, 3-5 times a week for a duration of 6-12 weeks.  Shoulder Exercises that included:  Basic scapular stabilization to include adduction and depression of scapula Scaption, focusing on proper movement and good control Internal and External rotation utilizing a theraband, with elbow tucked at side entire time Rows with theraband which was given Proper technique shown and discussed handout in great detail with ATC.  All questions were discussed and answered.     Impression  and Recommendations:     This case required medical decision making of moderate complexity.      Note: This dictation was prepared with Dragon dictation along with smaller phrase technology. Any transcriptional errors that result from this process are unintentional.

## 2017-03-16 ENCOUNTER — Ambulatory Visit (INDEPENDENT_AMBULATORY_CARE_PROVIDER_SITE_OTHER): Payer: 59 | Admitting: Family Medicine

## 2017-03-16 ENCOUNTER — Encounter: Payer: Self-pay | Admitting: Family Medicine

## 2017-03-16 ENCOUNTER — Ambulatory Visit: Payer: Self-pay

## 2017-03-16 VITALS — BP 142/84 | HR 78 | Ht 67.0 in | Wt 222.0 lb

## 2017-03-16 DIAGNOSIS — G8929 Other chronic pain: Secondary | ICD-10-CM | POA: Diagnosis not present

## 2017-03-16 DIAGNOSIS — M25512 Pain in left shoulder: Principal | ICD-10-CM

## 2017-03-16 DIAGNOSIS — E11618 Type 2 diabetes mellitus with other diabetic arthropathy: Secondary | ICD-10-CM

## 2017-03-16 DIAGNOSIS — M7500 Adhesive capsulitis of unspecified shoulder: Secondary | ICD-10-CM | POA: Diagnosis not present

## 2017-03-16 MED ORDER — VITAMIN D (ERGOCALCIFEROL) 1.25 MG (50000 UNIT) PO CAPS: 50000.0000 [IU] | ORAL_CAPSULE | ORAL | 0 refills | Status: DC

## 2017-03-16 NOTE — Assessment & Plan Note (Signed)
Patient had more of a frozen shoulder.  Given prognosis and different treatment options.  Patient elected to try injection, home exercise, icing regimen, once weekly vitamin D and will monitor blood sugars more regularly.  At the moment she does not check regularly.  Patient will come back in 4 weeks to make sure patient is responding well.

## 2017-03-16 NOTE — Patient Instructions (Addendum)
Good to see you  Alvera Singh is your friend. Ice 20 minutes 2 times daily. Usually after activity and before bed. Exercises 3 times a week.  Once weekly vitamin D for 12 weeks pennsaid pinkie amount topically 2 times daily as needed.  I injected the shoulder as well  See me again in 4 weeks

## 2017-05-06 ENCOUNTER — Encounter: Payer: Self-pay | Admitting: Gastroenterology

## 2017-05-11 ENCOUNTER — Encounter: Payer: Self-pay | Admitting: Gastroenterology

## 2017-05-16 ENCOUNTER — Telehealth: Payer: Self-pay | Admitting: Internal Medicine

## 2017-05-16 NOTE — Telephone Encounter (Signed)
Copied from [place] [id number]. Topic: Quick Communication - Rx Refill/Question >> May 16, 2017 12:38 PM Arletha Grippe wrote: Medication: linzess  Has the patient contacted their pharmacy? No. New rx  (Agent: If no, request that the patient contact the pharmacy for the refill.) Preferred Pharmacy (with phone number or street name): [facility] [address]  Agent: Please be advised that RX refills may take up to 3 business days. We ask that you follow-up with your pharmacy.  Pt sais she and provider talked about this earlier

## 2017-05-16 NOTE — Telephone Encounter (Signed)
MD is out of the office. Forwarding msg to MD assist to see if she know anything about it...[name]

## 2017-05-16 NOTE — Telephone Encounter (Signed)
Linzess (New prescription). Patient says there was a discussion about starting this. Last OV:02/19/17 MQT:TCNGF Pharmacy: [id number] [address], [place] [phone] (Phone) [phone] (Fax)

## 2017-05-21 ENCOUNTER — Other Ambulatory Visit: Payer: Self-pay | Admitting: Internal Medicine

## 2017-05-21 DIAGNOSIS — K5904 Chronic idiopathic constipation: Secondary | ICD-10-CM

## 2017-05-21 MED ORDER — LINACLOTIDE 145 MCG PO CAPS: 145.0000 ug | ORAL_CAPSULE | Freq: Every day | ORAL | 1 refills | Status: DC

## 2017-05-21 NOTE — Telephone Encounter (Signed)
Pt is rq rx for linzess. Please advise.

## 2017-05-23 NOTE — Telephone Encounter (Signed)
Pt calling to f/u on requesting [facility] [address]. She requested last week. Apologized as provider was out of office. Please advise.

## 2017-05-24 NOTE — Telephone Encounter (Signed)
Tried to contact pt, no vm to leave a message. PCP has sent in the linzess as requested. CRM created.

## 2017-07-11 ENCOUNTER — Ambulatory Visit (AMBULATORY_SURGERY_CENTER): Payer: Self-pay | Admitting: *Deleted

## 2017-07-11 VITALS — Ht 67.0 in | Wt 219.0 lb

## 2017-07-11 DIAGNOSIS — Z86010 Personal history of colonic polyps: Secondary | ICD-10-CM

## 2017-07-11 MED ORDER — NA SULFATE-K SULFATE-MG SULF 17.5-3.13-1.6 GM/177ML PO SOLN: 1.0000 | Freq: Once | ORAL | 0 refills | Status: AC

## 2017-07-11 NOTE — Progress Notes (Signed)
Patient denies any allergies to eggs or soy. Patient denies any problems with anesthesia/sedation. Patient denies any oxygen use at home. Patient denies taking any diet/weight loss medications or blood thinners. EMMI education video offered, pt declined.  2 day prep with MIralax/Suprep given to pt due to pt c/o constipation. Suprep coupon $15 off given to pt today.

## 2017-07-12 ENCOUNTER — Encounter: Payer: Self-pay | Admitting: Gastroenterology

## 2017-07-25 ENCOUNTER — Ambulatory Visit (AMBULATORY_SURGERY_CENTER): Payer: 59 | Admitting: Gastroenterology

## 2017-07-25 ENCOUNTER — Encounter: Payer: Self-pay | Admitting: Gastroenterology

## 2017-07-25 VITALS — BP 118/74 | HR 67 | Temp 97.8°F | Resp 16 | Ht 67.0 in | Wt 219.0 lb

## 2017-07-25 DIAGNOSIS — Z86010 Personal history of colonic polyps: Secondary | ICD-10-CM

## 2017-07-25 DIAGNOSIS — D125 Benign neoplasm of sigmoid colon: Secondary | ICD-10-CM | POA: Diagnosis not present

## 2017-07-25 LAB — HM COLONOSCOPY

## 2017-07-25 MED ORDER — SODIUM CHLORIDE 0.9 % IV SOLN: 500.0000 mL | Freq: Once | INTRAVENOUS | Status: DC

## 2017-07-25 NOTE — Progress Notes (Signed)
Pt's states no medical or surgical changes since previsit or office visit. 

## 2017-07-25 NOTE — Progress Notes (Signed)
To PACU, VSS. Report to [email or web address] 

## 2017-07-25 NOTE — Op Note (Signed)
[facility] Patient Name: [name] Procedure Date: 07/25/2017 9:19 AM MRN: [id number] Endoscopist: Mauri Pole , MD Age: 62 Referring MD:  Date of Birth: [birth year] Gender: Female Account #: [id number] Procedure:                Colonoscopy Indications:              High risk colon cancer surveillance: Personal                            history of adenoma less than 10 mm in size, Last                            colonoscopy: 2014 Medicines:                Monitored Anesthesia Care Procedure:                Pre-Anesthesia Assessment:                           - Prior to the procedure, a History and Physical                            was performed, and patient medications and                            allergies were reviewed. The patient's tolerance of                            previous anesthesia was also reviewed. The risks                            and benefits of the procedure and the sedation                            options and risks were discussed with the patient.                            All questions were answered, and informed consent                            was obtained. Prior Anticoagulants: The patient has                            taken no previous anticoagulant or antiplatelet                            agents. ASA Grade Assessment: II - A patient with                            mild systemic disease. After reviewing the risks                            and benefits, the patient was deemed in  satisfactory condition to undergo the procedure.                           After obtaining informed consent, the colonoscope                            was passed under direct vision. Throughout the                            procedure, the patient's blood pressure, pulse, and                            oxygen saturations were monitored continuously. The                            Colonoscope was introduced through the anus  and                            advanced to the the cecum, identified by                            appendiceal orifice and ileocecal valve. The                            colonoscopy was performed without difficulty. The                            patient tolerated the procedure well. The quality                            of the bowel preparation was excellent. The                            ileocecal valve, appendiceal orifice, and rectum                            were photographed. Scope In: 9:30:39 AM Scope Out: 9:49:36 AM Scope Withdrawal Time: 0 hours 9 minutes 5 seconds  Total Procedure Duration: 0 hours 18 minutes 57 seconds  Findings:                 The perianal and digital rectal examinations were                            normal.                           A 5 mm polyp was found in the sigmoid colon. The                            polyp was sessile. The polyp was removed with a                            cold snare. Resection and retrieval were complete.  A few small-mouthed diverticula were found in the                            sigmoid colon.                           Non-bleeding internal hemorrhoids were found during                            retroflexion. The hemorrhoids were medium-sized. Complications:            No immediate complications. Estimated Blood Loss:     Estimated blood loss was minimal. Impression:               - One 5 mm polyp in the sigmoid colon, removed with                            a cold snare. Resected and retrieved.                           - Diverticulosis in the sigmoid colon.                           - Non-bleeding internal hemorrhoids. Recommendation:           - Patient has a contact number available for                            emergencies. The signs and symptoms of potential                            delayed complications were discussed with the                            patient. Return to normal  activities tomorrow.                            Written discharge instructions were provided to the                            patient.                           - Resume previous diet.                           - Continue present medications.                           - Await pathology results.                           - Repeat colonoscopy in 5 years for surveillance                            based on pathology results. Mauri Pole, MD 07/25/2017 9:53:30 AM This report has been signed electronically.

## 2017-07-25 NOTE — Progress Notes (Signed)
Called to room to assist during endoscopic procedure.  Patient ID and intended procedure confirmed with present staff. Received instructions for my participation in the procedure from the performing physician.  

## 2017-07-26 ENCOUNTER — Telehealth: Payer: Self-pay | Admitting: *Deleted

## 2017-07-26 NOTE — Telephone Encounter (Signed)
No answer, voice mail not set up, unable to leave message.  Not available at work phone.

## 2017-07-26 NOTE — Telephone Encounter (Signed)
2nd follow-up call attempt.  Voicemail not set up to receive messages.

## 2017-07-31 ENCOUNTER — Encounter: Payer: Self-pay | Admitting: Gastroenterology

## 2017-08-02 ENCOUNTER — Other Ambulatory Visit: Payer: Self-pay | Admitting: Internal Medicine

## 2017-08-02 DIAGNOSIS — I10 Essential (primary) hypertension: Secondary | ICD-10-CM

## 2017-08-03 NOTE — Telephone Encounter (Signed)
PCP wanted pt to follow up in 4 months. Can you call and have pt schedule?

## 2017-08-06 NOTE — Telephone Encounter (Signed)
Voice mail not set-up yet.

## 2017-08-08 LAB — HM COLONOSCOPY

## 2017-08-13 NOTE — Telephone Encounter (Signed)
Sent 30 day script to pof until appt.Marland KitchenJohny Reeves

## 2017-08-13 NOTE — Telephone Encounter (Signed)
Pt called and scheduled appt for 08/30/17. She states she has been w/out amlodipine for 2 weeks. She said the pharmacy has been requesting for 3 weeks. Please advise.  [id number] [address], [place] [facility] [place]

## 2017-08-30 ENCOUNTER — Ambulatory Visit (INDEPENDENT_AMBULATORY_CARE_PROVIDER_SITE_OTHER): Payer: 59 | Admitting: Internal Medicine

## 2017-08-30 ENCOUNTER — Other Ambulatory Visit (INDEPENDENT_AMBULATORY_CARE_PROVIDER_SITE_OTHER): Payer: 59

## 2017-08-30 ENCOUNTER — Encounter: Payer: Self-pay | Admitting: Internal Medicine

## 2017-08-30 VITALS — BP 160/90 | HR 68 | Temp 98.3°F | Resp 16 | Ht 67.0 in | Wt 216.2 lb

## 2017-08-30 DIAGNOSIS — I10 Essential (primary) hypertension: Secondary | ICD-10-CM | POA: Diagnosis not present

## 2017-08-30 DIAGNOSIS — E785 Hyperlipidemia, unspecified: Secondary | ICD-10-CM

## 2017-08-30 DIAGNOSIS — K5904 Chronic idiopathic constipation: Secondary | ICD-10-CM

## 2017-08-30 DIAGNOSIS — Z23 Encounter for immunization: Secondary | ICD-10-CM

## 2017-08-30 DIAGNOSIS — E559 Vitamin D deficiency, unspecified: Secondary | ICD-10-CM

## 2017-08-30 DIAGNOSIS — E118 Type 2 diabetes mellitus with unspecified complications: Secondary | ICD-10-CM

## 2017-08-30 LAB — BASIC METABOLIC PANEL
BUN: 14 mg/dL (ref 6–23)
CO2: 26 mEq/L (ref 19–32)
CREATININE: 0.75 mg/dL (ref 0.40–1.20)
Calcium: 9.5 mg/dL (ref 8.4–10.5)
Chloride: 107 mEq/L (ref 96–112)
GFR: 101.23 mL/min (ref >60.00)
Glucose, Bld: 108 mg/dL — ABNORMAL HIGH (ref 70–99)
Potassium: 3.7 mEq/L (ref 3.5–5.1)
Sodium: 140 mEq/L (ref 135–145)

## 2017-08-30 LAB — POCT GLYCOSYLATED HEMOGLOBIN (HGB A1C): Hemoglobin A1C: 5.8 % — AB (ref 4.0–5.6)

## 2017-08-30 LAB — URINALYSIS, ROUTINE W REFLEX MICROSCOPIC
Bilirubin Urine: NEGATIVE
Hgb urine dipstick: NEGATIVE
Ketones, ur: NEGATIVE
Leukocytes, UA: NEGATIVE
NITRITE: NEGATIVE
RBC / HPF: NONE SEEN (ref 0–?)
SPECIFIC GRAVITY, URINE: 1.025 (ref 1.000–1.030)
Total Protein, Urine: NEGATIVE
URINE GLUCOSE: NEGATIVE
UROBILINOGEN UA: 0.2 (ref 0.0–1.0)
pH: 6 (ref 5.0–8.0)

## 2017-08-30 LAB — POCT UA - MICROALBUMIN
Creatinine, POC: 100 mg/dL
MICROALBUMIN (UR) POC: 80 mg/L

## 2017-08-30 LAB — LIPID PANEL
CHOLESTEROL: 201 mg/dL — AB (ref 0–200)
HDL: 42.7 mg/dL (ref >39.00)
LDL CALC: 137 mg/dL — AB (ref 0–99)
NonHDL: 158.64
Total CHOL/HDL Ratio: 5
Triglycerides: 108 mg/dL (ref 0.0–149.0)
VLDL: 21.6 mg/dL (ref 0.0–40.0)

## 2017-08-30 LAB — VITAMIN D 25 HYDROXY (VIT D DEFICIENCY, FRACTURES): VITD: 15.94 ng/mL — ABNORMAL LOW (ref 30.00–100.00)

## 2017-08-30 MED ORDER — PLECANATIDE 3 MG PO TABS: 1.0000 | ORAL_TABLET | Freq: Every day | ORAL | 1 refills | Status: DC

## 2017-08-30 MED ORDER — OLMESARTAN MEDOXOMIL-HCTZ 20-12.5 MG PO TABS: 1.0000 | ORAL_TABLET | Freq: Every day | ORAL | 1 refills | Status: DC

## 2017-08-30 MED ORDER — CHOLECALCIFEROL 1.25 MG (50000 UT) PO CAPS: 50000.0000 [IU] | ORAL_CAPSULE | ORAL | 1 refills | Status: DC

## 2017-08-30 NOTE — Progress Notes (Signed)
Subjective:  Patient ID: [name], female    DOB: 15-Oct-1955  Age: 62 y.o. MRN: [id number]  CC: Hypertension   HPI [name] presents for f/up - She continues to complain of constipation.  She only has a bowel movement about twice a week and that is after she uses milk of magnesia.  She is also concerned that her blood pressure is not well controlled.  She denies any recent episodes of headache, blurred vision, chest pain, shortness of breath, or edema.  He is compliant with amlodipine.  Outpatient Medications Prior to Visit  Medication Sig Dispense Refill  . aspirin EC 325 MG tablet Take 1 tablet (325 mg total) by mouth daily. For 30 days post op for DVT Prophylaxis 30 tablet 0  . atorvastatin (LIPITOR) 40 MG tablet TAKE 1 TABLET BY MOUTH EVERY DAY 90 tablet 1  . Calcium Carbonate-Vitamin D (CALTRATE 600+D) 600-400 MG-UNIT per tablet Take 1 tablet by mouth daily.     . Multiple Vitamins-Minerals (MULTIVITAMIN WITH MINERALS) tablet Take 1 tablet by mouth daily.    Marland Kitchen omeprazole (PRILOSEC) 20 MG capsule Take 1 capsule (20 mg total) by mouth daily. 30 days for gastroprotection while taking Aspirin. 30 capsule 0  . Vitamin D, Ergocalciferol, (DRISDOL) 50000 units CAPS capsule Take 1 capsule (50,000 Units total) by mouth every 7 (seven) days. 12 capsule 0  . amLODipine (NORVASC) 10 MG tablet TAKE 1 TABLET BY MOUTH EVERY DAY.  DUE FOR FOLLOW UP 30 tablet 0  . meloxicam (MOBIC) 15 MG tablet Take 1 tablet (15 mg total) by mouth daily. 90 tablet 0   No facility-administered medications prior to visit.     ROS Review of Systems  Constitutional: Negative for appetite change, diaphoresis and fatigue.  HENT: Negative.   Eyes: Negative for visual disturbance.  Respiratory: Negative for cough, chest tightness, shortness of breath and wheezing.   Cardiovascular: Negative for chest pain, palpitations and leg swelling.  Gastrointestinal: Positive for constipation. Negative for abdominal pain,  diarrhea, nausea and vomiting.  Genitourinary: Negative.  Negative for difficulty urinating and vaginal pain.  Musculoskeletal: Negative for arthralgias and myalgias.  Skin: Negative.  Negative for color change and rash.  Neurological: Negative.  Negative for dizziness, weakness, light-headedness and headaches.  Hematological: Negative for adenopathy. Does not bruise/bleed easily.  Psychiatric/Behavioral: Negative.     Objective:  BP (!) 160/90 (BP Location: Left Arm, Patient Position: Sitting, Cuff Size: Large)   Pulse 68   Temp 98.3 F (36.8 C) (Oral)   Resp 16   Ht 5\' 7"  (1.702 m)   Wt 216 lb 4 oz (98.1 kg)   LMP 07/18/2012   SpO2 98%   BMI 33.87 kg/m   BP Readings from Last 3 Encounters:  08/30/17 (!) 160/90  07/25/17 118/74  03/16/17 (!) 142/84    Wt Readings from Last 3 Encounters:  08/30/17 216 lb 4 oz (98.1 kg)  07/25/17 219 lb (99.3 kg)  07/11/17 219 lb (99.3 kg)    Physical Exam  Constitutional: She is oriented to person, place, and time. No distress.  HENT:  Mouth/Throat: Oropharynx is clear and moist. No oropharyngeal exudate.  Eyes: Conjunctivae are normal. No scleral icterus.  Neck: Normal range of motion. Neck supple. No JVD present. No thyromegaly present.  Cardiovascular: Normal rate, regular rhythm and normal heart sounds. Exam reveals no gallop.  No murmur heard. Pulmonary/Chest: Effort normal and breath sounds normal. No stridor. She has no wheezes. She has no rales.  Abdominal: Soft. Normal appearance and bowel sounds are normal. There is no hepatosplenomegaly. There is no tenderness.  Musculoskeletal: Normal range of motion. She exhibits no edema, tenderness or deformity.  Lymphadenopathy:    She has no cervical adenopathy.  Neurological: She is alert and oriented to person, place, and time.  Skin: Skin is warm and dry. She is not diaphoretic. No pallor.  Vitals reviewed.   Lab Results  Component Value Date   WBC 6.6 02/19/2017   HGB 12.1  02/19/2017   HCT 37.1 02/19/2017   PLT 207.0 02/19/2017   GLUCOSE 108 (H) 08/30/2017   CHOL 201 (H) 08/30/2017   TRIG 108.0 08/30/2017   HDL 42.70 08/30/2017   LDLDIRECT 167.9 11/09/2010   LDLCALC 137 (H) 08/30/2017   ALT 12 02/19/2017   AST 14 02/19/2017   NA 140 08/30/2017   K 3.7 08/30/2017   CL 107 08/30/2017   CREATININE 0.75 08/30/2017   BUN 14 08/30/2017   CO2 26 08/30/2017   TSH 1.34 02/19/2017   HGBA1C 5.8 (A) 08/30/2017   MICROALBUR 80 08/30/2017    No results found.  Assessment & Plan:   Nazyia was seen today for hypertension.  Diagnoses and all orders for this visit:  Essential hypertension- Hher blood pressure is not adequately well controlled.  The amlodipine may be contributing to the constipation so I have asked her to stop taking it.  I will treat the vitamin D deficiency.  Her labs are otherwise negative for secondary causes or endorgan damage.  Will start treating the high blood pressure with an ARB and thiazide diuretic. -     Basic metabolic panel; Future -     VITAMIN D 25 Hydroxy (Vit-D Deficiency, Fractures); Future -     olmesartan-hydrochlorothiazide (BENICAR HCT) 20-12.5 MG tablet; Take 1 tablet by mouth daily. -     Urinalysis, Routine w reflex microscopic; Future  Type 2 diabetes mellitus with complication, without long-term current use of insulin ([place])- Her blood sugars are very well controlled.  Will start an ARB for renal protection. -     Cancel: Microalbumin / creatinine urine ratio; Future -     Basic metabolic panel; Future -     Cancel: Hemoglobin A1c; Future -     POCT glycosylated hemoglobin (Hb A1C) -     POCT UA - Microalbumin  Hyperlipidemia with target LDL less than 130- She has achieved her LDL goal and is doing well on the statin. -     Lipid panel; Future  Need for influenza vaccination -     Flu Vaccine QUAD 36+ mos IM  Chronic idiopathic constipation- Her lab work is negative for secondary causes.  I will treat this with  plecanatide. -     Plecanatide (TRULANCE) 3 MG TABS; Take 1 tablet by mouth daily.  Vitamin D deficiency disease -     Cholecalciferol 50000 units capsule; Take 1 capsule (50,000 Units total) by mouth once a week.   I have discontinued Arzu L. Godwin's amLODipine. I am also having her start on olmesartan-hydrochlorothiazide, Plecanatide, and Cholecalciferol. Additionally, I am having her maintain her Calcium Carbonate-Vitamin D, multivitamin with minerals, aspirin EC, omeprazole, atorvastatin, and Vitamin D (Ergocalciferol).  Meds ordered this encounter  Medications  . olmesartan-hydrochlorothiazide (BENICAR HCT) 20-12.5 MG tablet    Sig: Take 1 tablet by mouth daily.    Dispense:  90 tablet    Refill:  1  . Plecanatide (TRULANCE) 3 MG TABS    Sig: Take  1 tablet by mouth daily.    Dispense:  90 tablet    Refill:  1  . Cholecalciferol 50000 units capsule    Sig: Take 1 capsule (50,000 Units total) by mouth once a week.    Dispense:  12 capsule    Refill:  1     Follow-up: Return in about 3 months (around 11/30/2017).  Scarlette Calico, MD

## 2017-08-30 NOTE — Patient Instructions (Signed)

## 2017-08-31 ENCOUNTER — Other Ambulatory Visit: Payer: Self-pay | Admitting: Internal Medicine

## 2017-08-31 DIAGNOSIS — M1610 Unilateral primary osteoarthritis, unspecified hip: Secondary | ICD-10-CM

## 2017-09-12 ENCOUNTER — Other Ambulatory Visit: Payer: Self-pay | Admitting: Internal Medicine

## 2017-09-12 DIAGNOSIS — I10 Essential (primary) hypertension: Secondary | ICD-10-CM

## 2017-09-26 ENCOUNTER — Other Ambulatory Visit: Payer: Self-pay | Admitting: Internal Medicine

## 2017-09-26 DIAGNOSIS — I10 Essential (primary) hypertension: Secondary | ICD-10-CM

## 2017-12-03 ENCOUNTER — Ambulatory Visit (HOSPITAL_COMMUNITY)
Admission: EM | Admit: 2017-12-03 | Discharge: 2017-12-03 | Disposition: A | Discharge status: Home / Self Care | Payer: 59 | Attending: Family Medicine | Admitting: Family Medicine

## 2017-12-03 ENCOUNTER — Encounter (HOSPITAL_COMMUNITY): Payer: Self-pay | Admitting: Emergency Medicine

## 2017-12-03 DIAGNOSIS — I10 Essential (primary) hypertension: Secondary | ICD-10-CM

## 2017-12-03 DIAGNOSIS — W57XXXA Bitten or stung by nonvenomous insect and other nonvenomous arthropods, initial encounter: Secondary | ICD-10-CM

## 2017-12-03 DIAGNOSIS — S1086XA Insect bite of other specified part of neck, initial encounter: Secondary | ICD-10-CM

## 2017-12-03 DIAGNOSIS — S1096XA Insect bite of unspecified part of neck, initial encounter: Secondary | ICD-10-CM

## 2017-12-03 LAB — HM MAMMOGRAPHY

## 2017-12-03 MED ORDER — TRIAMCINOLONE ACETONIDE 0.1 % EX OINT: 1.0000 "application " | TOPICAL_OINTMENT | Freq: Two times a day (BID) | CUTANEOUS | 1 refills | Status: DC

## 2017-12-03 NOTE — ED Provider Notes (Signed)
[facility]    CSN: [id number] Arrival date & time: 12/03/17  1007     History   Chief Complaint Chief Complaint  Patient presents with  . Insect Bite    HPI [name] is a 62 y.o. female.   HPI  She did yard work 4 days ago.  Has an insect bite on the back of her neck.  It is red and swollen.  She is been using cortisone cream.  It is a little better today.  She is here because she has been speaking to coworkers who told her that it could have been a spider bite.  It could be a bad infection.  It could be MRSA.  She states 1 of them mentioned a brown recluse.  They have her worried,    Now she wants the bite checked.  Itches moderately.  Is not painful.  No fever chills or malaise.  Past Medical History:  Diagnosis Date  . Arthritis    right hip  . Diabetes mellitus    NO MEDS FOR TREATMENT FOR THIS PER PT.  . High cholesterol   . Hyperlipidemia   . Hypertension   . Lung nodule 03/2006   LLL 4-57mm  . Pre-diabetes     Patient Active Problem List   Diagnosis Date Noted  . Vitamin D deficiency disease 08/30/2017  . Chronic idiopathic constipation 05/21/2017  . 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 Chronic left shoulder pain 02/19/2017  . Chronic right-sided low back pain with right-sided sciatica 01/10/2016  . Routine general medical examination at a health care facility 07/16/2015  . Obesity (BMI 30.0-34.9) 10/07/2013  . DJD (degenerative joint disease) of pelvis 07/09/2013  . GERD (gastroesophageal reflux disease) 03/11/2012  . Visit for screening mammogram 11/09/2010  . Essential hypertension 10/23/2007  . Hyperlipidemia with target LDL less than 130 04/04/2007  . Type II diabetes mellitus with manifestations ([place]) 04/03/2007    Past Surgical History:  Procedure Laterality Date  . COLONOSCOPY    . COLONOSCOPY  2014  . OVARIAN BIOPSY    . POLYPECTOMY    . 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 TOTAL HIP ARTHROPLASTY Right 09/26/2016   Procedure: TOTAL HIP ARTHROPLASTY ANTERIOR APPROACH;  Surgeon: Renette Butters, MD;  Location: [facility];  Service: Orthopedics;  Laterality: Right;  . TUBAL LIGATION      OB History   None      Home Medications    Prior to Admission medications   Medication Sig Start Date End Date Taking? Authorizing Provider  aspirin EC 325 MG tablet Take 1 tablet (325 mg total) by mouth daily. For 30 days post op for DVT Prophylaxis 09/26/16   Prudencio Burly III, PA-C  atorvastatin (LIPITOR) 40 MG tablet TAKE 1 TABLET BY MOUTH EVERY DAY 02/19/17   Janith Lima, MD  Calcium Carbonate-Vitamin D (CALTRATE 600+D) 600-400 MG-UNIT per tablet Take 1 tablet by mouth daily.     [provider]  Cholecalciferol 50000 units capsule Take 1 capsule (50,000 Units total) by mouth once a week. 08/30/17   Janith Lima, MD  meloxicam (MOBIC) 15 MG tablet TAKE 1 TABLET BY MOUTH EVERY DAY 08/31/17   Janith Lima, MD  Multiple Vitamins-Minerals (MULTIVITAMIN WITH MINERALS) tablet Take 1 tablet by mouth daily.    [provider]  olmesartan-hydrochlorothiazide (BENICAR HCT) 20-12.5 MG tablet Take 1 tablet by mouth daily. 08/30/17   Ronnald Ramp,  Arvid Right, MD  omeprazole (PRILOSEC) 20 MG capsule Take 1 capsule (20 mg total) by mouth daily. 30 days for gastroprotection while taking Aspirin. 09/26/16   Martensen, Charna Elizabeth III, PA-C  Plecanatide (TRULANCE) 3 MG TABS Take 1 tablet by mouth daily. 08/30/17   Janith Lima, MD  triamcinolone ointment (KENALOG) 0.1 % Apply 1 application topically 2 (two) times daily. 12/03/17   Raylene Everts, MD  Vitamin D, Ergocalciferol, (DRISDOL) 50000 units CAPS capsule Take 1 capsule (50,000 Units total) by mouth every 7 (seven) days. 03/16/17   Lyndal Pulley, DO    Family History Family History  Problem Relation Age of Onset  . Diabetes Brother   . Cancer Other        pancreatic cancer  . Esophageal cancer  Sister   . Colon cancer Neg Hx   . Rectal cancer Neg Hx   . Stomach cancer Neg Hx     Social History Social History   Tobacco Use  . Smoking status: Never Smoker  . Smokeless tobacco: Never Used  Substance Use Topics  . Alcohol use: No  . Drug use: No     Allergies   Amlodipine   Review of Systems Review of Systems  Constitutional: Negative for chills and fever.  HENT: Negative for ear pain and sore throat.   Eyes: Negative for pain and visual disturbance.  Respiratory: Negative for cough and shortness of breath.   Cardiovascular: Negative for chest pain and palpitations.  Gastrointestinal: Negative for abdominal pain and vomiting.  Genitourinary: Negative for dysuria and hematuria.  Musculoskeletal: Negative for arthralgias and back pain.  Skin: Positive for wound. Negative for color change and rash.  Neurological: Negative for seizures and syncope.  All other systems reviewed and are negative.    Physical Exam Triage Vital Signs ED Triage Vitals [12/03/17 1112]  Enc Vitals Group     BP (!) 152/88     Pulse Rate 63     Resp 18     Temp 97.9 F (36.6 C)     Temp Source Oral     SpO2 100 %     Weight      Height      Head Circumference      Peak Flow      Pain Score 5     Pain Loc      Pain Edu?      Excl. in [place]?    No data found.  Updated Vital Signs BP (!) 152/88 (BP Location: Right Arm)   Pulse 63   Temp 97.9 F (36.6 C) (Oral)   Resp 18   LMP 07/18/2012   SpO2 100%   Visual Acuity Right Eye Distance:   Left Eye Distance:   Bilateral Distance:    Right Eye Near:   Left Eye Near:    Bilateral Near:     Physical Exam  Constitutional: She appears well-developed and well-nourished. No distress.  HENT:  Head: Normocephalic and atraumatic.  Mouth/Throat: Oropharynx is clear and moist.  Eyes: Pupils are equal, round, and reactive to light. Conjunctivae are normal.  Neck: Normal range of motion.    Cardiovascular: Normal rate.    Pulmonary/Chest: Effort normal. No respiratory distress.  Abdominal: Soft. She exhibits no distension.  Musculoskeletal: Normal range of motion. She exhibits no edema.  Neurological: She is alert.  Skin: Skin is warm and dry.     UC Treatments / Results  Labs (all labs ordered are listed, but  only abnormal results are displayed) Labs Reviewed - No data to display  EKG None  Radiology No results found.  Procedures Procedures (including critical care time)  Medications Ordered in UC Medications - No data to display  Initial Impression / Assessment and Plan / UC Course  I have reviewed the triage vital signs and the nursing notes.  Pertinent labs & imaging results that were available during my care of the patient were reviewed by me and considered in my medical decision making (see chart for details).     Reassurance Final Clinical Impressions(s) / UC Diagnoses   Final diagnoses:  Insect bite of neck, initial encounter     Discharge Instructions     Take benadryl at night if itching Use the triamcinolone ointment 2-3 times a day This will resolve on its own   ED Prescriptions    Medication Sig Dispense Auth. Provider   triamcinolone ointment (KENALOG) 0.1 % Apply 1 application topically 2 (two) times daily. 15 g Raylene Everts, MD     Controlled Substance Prescriptions [place] Controlled Substance Registry consulted? Not Applicable   Raylene Everts, MD 12/03/17 [phone]

## 2017-12-03 NOTE — ED Triage Notes (Signed)
Pt sts possible insect bite to back of her neck; pt sts noticed after raking leaves on Saturday

## 2017-12-03 NOTE — Discharge Instructions (Addendum)
Take benadryl at night if itching Use the triamcinolone ointment 2-3 times a day This will resolve on its own

## 2018-02-16 ENCOUNTER — Other Ambulatory Visit: Payer: Self-pay | Admitting: Internal Medicine

## 2018-02-16 DIAGNOSIS — E559 Vitamin D deficiency, unspecified: Secondary | ICD-10-CM

## 2018-02-28 ENCOUNTER — Other Ambulatory Visit: Payer: Self-pay | Admitting: Internal Medicine

## 2018-02-28 DIAGNOSIS — E118 Type 2 diabetes mellitus with unspecified complications: Secondary | ICD-10-CM

## 2018-02-28 DIAGNOSIS — E785 Hyperlipidemia, unspecified: Secondary | ICD-10-CM

## 2018-04-03 ENCOUNTER — Encounter (HOSPITAL_COMMUNITY): Payer: Self-pay | Admitting: Emergency Medicine

## 2018-04-03 ENCOUNTER — Emergency Department (HOSPITAL_COMMUNITY)
Admission: EM | Admit: 2018-04-03 | Discharge: 2018-04-03 | Disposition: A | Discharge status: Home / Self Care | Payer: 59 | Attending: Emergency Medicine | Admitting: Emergency Medicine

## 2018-04-03 ENCOUNTER — Other Ambulatory Visit: Payer: Self-pay

## 2018-04-03 ENCOUNTER — Ambulatory Visit: Payer: Self-pay

## 2018-04-03 DIAGNOSIS — Z79899 Other long term (current) drug therapy: Secondary | ICD-10-CM | POA: Diagnosis not present

## 2018-04-03 DIAGNOSIS — R51 Headache: Secondary | ICD-10-CM

## 2018-04-03 DIAGNOSIS — Z96641 Presence of right artificial hip joint: Secondary | ICD-10-CM | POA: Insufficient documentation

## 2018-04-03 DIAGNOSIS — R519 Headache, unspecified: Secondary | ICD-10-CM

## 2018-04-03 DIAGNOSIS — I10 Essential (primary) hypertension: Secondary | ICD-10-CM

## 2018-04-03 DIAGNOSIS — E119 Type 2 diabetes mellitus without complications: Secondary | ICD-10-CM | POA: Insufficient documentation

## 2018-04-03 LAB — BASIC METABOLIC PANEL
Anion gap: 7 (ref 5–15)
Anion gap: 10 (ref 5–15)
BUN: 17 mg/dL (ref 6–20)
BUN: 17 mg/dL (ref 6–20)
CO2: 25 mmol/L (ref 22–32)
CO2: 20 mmol/L — ABNORMAL LOW (ref 22–32)
Calcium: 9.9 mg/dL (ref 8.9–10.3)
Calcium: 9.9 mg/dL (ref 8.9–10.3)
Chloride: 103 mmol/L (ref 98–111)
Chloride: 104 mmol/L (ref 98–111)
Creatinine, Ser: 0.85 mg/dL (ref 0.44–1.00)
Creatinine, Ser: 0.75 mg/dL (ref 0.44–1.00)
GFR calc Af Amer: >60 mL/min (ref >60)
GFR calc Af Amer: >60 mL/min (ref >60)
GFR calc non Af Amer: >60 mL/min (ref >60)
GFR calc non Af Amer: >60 mL/min (ref >60)
Glucose, Bld: 92 mg/dL (ref 70–99)
Glucose, Bld: 91 mg/dL (ref 70–99)
Potassium: 5.6 mmol/L — ABNORMAL HIGH (ref 3.5–5.1)
Potassium: 3.6 mmol/L (ref 3.5–5.1)
Sodium: 135 mmol/L (ref 135–145)
Sodium: 134 mmol/L — ABNORMAL LOW (ref 135–145)

## 2018-04-03 LAB — URINALYSIS, ROUTINE W REFLEX MICROSCOPIC
Bilirubin Urine: NEGATIVE
Glucose, UA: NEGATIVE mg/dL
Hgb urine dipstick: NEGATIVE
Ketones, ur: NEGATIVE mg/dL
Nitrite: NEGATIVE
Protein, ur: NEGATIVE mg/dL
Specific Gravity, Urine: 1.015 (ref 1.005–1.030)
pH: 5 (ref 5.0–8.0)

## 2018-04-03 LAB — CBC WITH DIFFERENTIAL/PLATELET
Abs Immature Granulocytes: 0.03 10*3/uL (ref 0.00–0.07)
Basophils Absolute: 0 10*3/uL (ref 0.0–0.1)
Basophils Relative: 0 %
Eosinophils Absolute: 0.1 10*3/uL (ref 0.0–0.5)
Eosinophils Relative: 1 %
HCT: 42.8 % (ref 36.0–46.0)
Hemoglobin: 13.2 g/dL (ref 12.0–15.0)
Immature Granulocytes: 0 %
Lymphocytes Relative: 25 %
Lymphs Abs: 2.3 10*3/uL (ref 0.7–4.0)
MCH: 25.5 pg — ABNORMAL LOW (ref 26.0–34.0)
MCHC: 30.8 g/dL (ref 30.0–36.0)
MCV: 82.6 fL (ref 80.0–100.0)
Monocytes Absolute: 0.8 10*3/uL (ref 0.1–1.0)
Monocytes Relative: 8 %
Neutro Abs: 6 10*3/uL (ref 1.7–7.7)
Neutrophils Relative %: 66 %
Platelets: 253 10*3/uL (ref 150–400)
RBC: 5.18 MIL/uL — ABNORMAL HIGH (ref 3.87–5.11)
RDW: 15.4 % (ref 11.5–15.5)
WBC: 9.2 10*3/uL (ref 4.0–10.5)
nRBC: 0 % (ref 0.0–0.2)

## 2018-04-03 MED ORDER — METOCLOPRAMIDE HCL 5 MG/ML IJ SOLN
10.0000 mg | Freq: Once | INTRAMUSCULAR | Status: AC
  Administered 2018-04-03: 16:00:00 10 mg via INTRAVENOUS
  Filled 2018-04-03: qty 2

## 2018-04-03 MED ORDER — KETOROLAC TROMETHAMINE 15 MG/ML IJ SOLN
15.0000 mg | Freq: Once | INTRAMUSCULAR | Status: AC
  Administered 2018-04-03: 15 mg via INTRAVENOUS
  Filled 2018-04-03: qty 1

## 2018-04-03 NOTE — Discharge Instructions (Addendum)
You have been seen today for high blood pressure and a headache. Please read and follow all provided instructions.   1. Medications: tylenol for headaches, usual home medications 2. Treatment: rest, drink plenty of fluids 3. Follow Up: Please follow up with your primary doctor in 2-5 days for discussion of your diagnoses and further evaluation after today's visit; if you do not have a primary care doctor use the resource guide provided to find one; Please return to the ER for any new or worsening symptoms. Please obtain all of your results from medical records or have your doctors office obtain the results - share them with your doctor - you should be seen at your doctors office. Call today to arrange your follow up.   Take medications as prescribed. Please review all of the medicines and only take them if you do not have an allergy to them. Return to the emergency room for worsening condition or new concerning symptoms. Follow up with your regular doctor. If you don't have a regular doctor use one of the numbers below to establish a primary care doctor.  Please be aware that if you are taking birth control pills, taking other prescriptions, ESPECIALLY ANTIBIOTICS may make the birth control ineffective - if this is the case, either do not engage in sexual activity or use alternative methods of birth control such as condoms until you have finished the medicine and your family doctor says it is OK to restart them. If you are on a blood thinner such as COUMADIN, be aware that any other medicine that you take may cause the coumadin to either work too much, or not enough - you should have your coumadin level rechecked in next 7 days if this is the case.  ?  It is also a possibility that you have an allergic reaction to any of the medicines that you have been prescribed - Everybody reacts differently to medications and while MOST people have no trouble with most medicines, you may have a reaction such as nausea,  vomiting, rash, swelling, shortness of breath. If this is the case, please stop taking the medicine immediately and contact your physician.  ?  You should return to the ER if you develop severe or worsening symptoms.   Emergency Department Resource Guide 1) Find a Doctor and Pay Out of Pocket Although you won't have to find out who is covered by your insurance plan, it is a good idea to ask around and get recommendations. You will then need to call the office and see if the doctor you have chosen will accept you as a new patient and what types of options they offer for patients who are self-pay. Some doctors offer discounts or will set up payment plans for their patients who do not have insurance, but you will need to ask so you aren't surprised when you get to your appointment.  2) Contact Your Local Health Department Not all health departments have doctors that can see patients for sick visits, but many do, so it is worth a call to see if yours does. If you don't know where your local health department is, you can check in your phone book. The CDC also has a tool to help you locate your state's health department, and many state websites also have listings of all of their local health departments.  3) Find a [facility] If your illness is not likely to be very severe or complicated, you may want to try a walk in  clinic. These are popping up all over the country in pharmacies, drugstores, and shopping centers. They're usually staffed by nurse practitioners or physician assistants that have been trained to treat common illnesses and complaints. They're usually fairly quick and inexpensive. However, if you have serious medical issues or chronic medical problems, these are probably not your best option.  No Primary Care Doctor: Call Health Connect at  [phone] - they can help you locate a primary care doctor that  accepts your insurance, provides certain services, etc. Physician Referral Service-  [phone]  Chronic Pain Problems: Organization         Address  Phone   Notes  [facility]  [phone] Patients need to be referred by their primary care doctor.   Medication Assistance: Organization         Address  Phone   Notes  [facility] Medication [facility] [address] [place], [place] [address] [phone] --Must be a resident of [facility] -- Must have NO insurance coverage whatsoever (no Medicaid/ Medicare, etc.) -- The pt. MUST have a primary care doctor that directs their care regularly and follows them in the community   [facility]  [phone]   [employer]  [phone]    Agencies that provide inexpensive medical care: Organization         Address  Phone   Notes  [place]  [phone]   [facility]    [phone]   [facility] [place], [place] [address] [phone]   [place] [address], [place] [phone]   [facility]    [phone]   [facility]    [phone]   [place] and [place] [address], [place] Phone:  [phone], Fax:  [phone] Hours of Operation:  9 am - 6 pm, M-F.  Also accepts Medicaid/Medicare and self-pay.  [facility] for [address], [address], [place] Phone: [phone], Fax: [phone]. Hours of Operation:  8:30 am - 5:30 pm, M-F.  Also accepts Medicaid and self-pay.  [facility] [address] [place] Phone: [phone]   [place], [place], [place] [phone], [address]. [phone] Mondays & Thursdays: 7-9 AM.  First 15 patients are seen on a first come, first serve basis.    [place] Providers:  Organization         Address  Phone   Notes  [facility] [address],   [phone] Also accepts self-pay patients.  [facility] [address], [place]  [phone]   [place], [address], [place] [phone]   [facility] [address] [place] [phone]   Lucianne Lei [address], [place]   [phone] Only accepts [place] [facility] [place] patients after they have their name applied to their card.   Self-Pay (no insurance) in [facility]:  Organization         Address  Phone   Notes  Sickle Cell Patients, [facility] [place] [phone]   [facility] [place] [phone]   [facility] [place]  [place], [address], [place] [phone]   [facility]/Dr. Osei-Bonsu  [address] [place] or [address], [place] [phone] Phone number for both [place] and [place] locations is the same.  Urgent Medical and [facility] [address] [place] [phone]   [facility] [address] [place] or [address] [phone]   [facility] [address], [place] [phone], phone; [phone], fax Sees patients 1st and 3rd Saturday of every month.  Must not qualify for public or private insurance (i.e. Medicaid, Medicare, [place] Health Choice, Veterans' Benefits)  Household income should be no more than 200% of the poverty level The clinic cannot treat you if you are pregnant or think you are pregnant  Sexually transmitted diseases are not treated at the clinic.

## 2018-04-03 NOTE — ED Triage Notes (Signed)
Pt states she was at work today and her BP was elevated. Pt states she felt dizzy, had a headache. Denies weakness.

## 2018-04-03 NOTE — ED Notes (Signed)
Pt verbalized understanding of discharge instructions and denies any further questions at this time.   

## 2018-04-03 NOTE — Telephone Encounter (Signed)
Pt called to report BP 135/119 taken at her job. Her manager sent pt home. Pt having dizziness,headache, pt stated "food tastes differently, bilateral arm tingling/burning sensation. Pt advised to go to ED now. Pt verbalized understanding. Care advice given and pt verbalized understanding. Pt stated she will go to [facility] ED.                Reason for Disposition . [7] Systolic BP  >= 591 OR Diastolic >= 638 AND [4] cardiac or neurologic symptoms (e.g., chest pain, difficulty breathing, unsteady gait, blurred vision)  Answer Assessment - Initial Assessment Questions 1. BLOOD PRESSURE: "What is the blood pressure?" "Did you take at least two measurements 5 minutes apart?"     135/119 2. ONSET: "When did you take your blood pressure?"     1:45 pm 3. HOW: "How did you obtain the blood pressure?" (e.g., visiting nurse, automatic home BP monitor)     At work 4. HISTORY: "Do you have a history of high blood pressure?"    yes 5. MEDICATIONS: "Are you taking any medications for blood pressure?" "Have you missed any doses recently?"    Yes-no 6. OTHER SYMPTOMS: "Do you have any symptoms?" (e.g., headache, chest pain, blurred vision, difficulty breathing, weakness)     Dizziness, headache, food tastes differently, arms tingling, burning sensation 7. PREGNANCY: "Is there any chance you are pregnant?" "When was your last menstrual period?" n/a  Protocols used: HIGH BLOOD PRESSURE-A-AH

## 2018-04-03 NOTE — ED Provider Notes (Signed)
[facility] EMERGENCY DEPARTMENT Provider Note   CSN: [id number] Arrival date & time: 04/03/18  1440  History   Chief Complaint Chief Complaint  Patient presents with   Hypertension   Headache    HPI [name] is a 63 y.o. female with a PMH of HTN, Diabetes, Arthritis, and HLD presenting with a constant gradual right sided throbbing headache onset 1 week ago. Patient reports she had a few episodes at work today when she felt dizzy and had to sit down. Patient describes dizziness as feeling lightheaded. Patient states symptoms resolved when she sat down. Patient states she had her blood pressure taken at work and it was elevated at around 170/119. Patient states she has been complaint with her blood pressure medications. Patient states this is not different than previous headaches. Patient states headache is worse with "thinking" and better with sleeping.  Patient denies vision changes, weakness, numbness, syncope, chest pain, or shortness of breath. Patient denies sick exposures or recent travel. Patient denies fever, cough, congestion, nausea, vomiting, abdominal pain, or diarrhea. Patient reports she has an increased levels of stress at work.      HPI  Past Medical History:  Diagnosis Date   Arthritis    right hip   Diabetes mellitus    NO MEDS FOR TREATMENT FOR THIS PER PT.   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 ([place]) 03/16/2017   Chronic left shoulder pain 02/19/2017   Chronic right-sided low back pain with right-sided sciatica 01/10/2016   Routine general medical examination at a health care facility 07/16/2015   Obesity (BMI 30.0-34.9) 10/07/2013   DJD  (degenerative joint disease) of pelvis 07/09/2013   GERD (gastroesophageal reflux disease) 03/11/2012   Visit for screening mammogram 11/09/2010   Essential hypertension 10/23/2007   Hyperlipidemia with target LDL less than 130 04/04/2007   Type II diabetes mellitus with manifestations ([place]) 04/03/2007    Past Surgical History:  Procedure Laterality Date   COLONOSCOPY     COLONOSCOPY  2014   OVARIAN BIOPSY     POLYPECTOMY     TOTAL HIP ARTHROPLASTY Right 09/26/2016   TOTAL HIP ARTHROPLASTY Right 09/26/2016   Procedure: TOTAL HIP ARTHROPLASTY ANTERIOR APPROACH;  Surgeon: Renette Butters, MD;  Location: [facility];  Service: Orthopedics;  Laterality: Right;   TUBAL LIGATION       OB History   No obstetric history on file.      Home Medications    Prior to Admission medications   Medication Sig Start Date End Date Taking? Authorizing Provider  aspirin EC 325 MG tablet Take 1 tablet (325 mg total) by mouth daily. For 30 days post op for DVT Prophylaxis 09/26/16   Prudencio Burly III, PA-C  atorvastatin (LIPITOR) 40 MG tablet TAKE 1 TABLET BY MOUTH EVERY DAY Patient taking differently: Take 40 mg by mouth daily at 6 PM.  02/28/18   Janith Lima, MD  Calcium Carbonate-Vitamin D (CALTRATE 600+D) 600-400 MG-UNIT per tablet Take 1 tablet by mouth daily.     [provider]  Cholecalciferol (VITAMIN D3) 1.25 MG (50000 UT) CAPS TAKE ONE CAPSULE BY MOUTH ONE TIME PER WEEK Patient taking  differently: Take 50,000 Units by mouth every 7 (seven) days.  02/17/18   Janith Lima, MD  meloxicam (MOBIC) 15 MG tablet TAKE 1 TABLET BY MOUTH EVERY DAY Patient taking differently: Take 15 mg by mouth daily.  08/31/17   Janith Lima, MD  Multiple Vitamins-Minerals (MULTIVITAMIN WITH MINERALS) tablet Take 1 tablet by mouth daily.    [provider]  olmesartan-hydrochlorothiazide (BENICAR HCT) 20-12.5 MG tablet Take 1 tablet by mouth daily. 08/30/17   Janith Lima,  MD  omeprazole (PRILOSEC) 20 MG capsule Take 1 capsule (20 mg total) by mouth daily. 30 days for gastroprotection while taking Aspirin. 09/26/16   Martensen, Charna Elizabeth III, PA-C  Plecanatide (TRULANCE) 3 MG TABS Take 1 tablet by mouth daily. Patient taking differently: Take 3 mg by mouth daily.  08/30/17   Janith Lima, MD  triamcinolone ointment (KENALOG) 0.1 % Apply 1 application topically 2 (two) times daily. 12/03/17   Raylene Everts, MD    Family History Family History  Problem Relation Age of Onset   Diabetes Brother    Cancer Other        pancreatic cancer   Esophageal cancer Sister    Colon cancer Neg Hx    Rectal cancer Neg Hx    Stomach cancer Neg Hx     Social History Social History   Tobacco Use   Smoking status: Never Smoker   Smokeless tobacco: Never Used  Substance Use Topics   Alcohol use: No   Drug use: No     Allergies   Amlodipine   Review of Systems Review of Systems  Constitutional: Negative for activity change, appetite change, chills, diaphoresis, fatigue, fever and unexpected weight change.  HENT: Negative for congestion, rhinorrhea, sinus pressure and sore throat.   Eyes: Negative for photophobia and visual disturbance.  Respiratory: Negative for shortness of breath.   Cardiovascular: Negative for chest pain.  Gastrointestinal: Negative for abdominal pain, nausea and vomiting.  Endocrine: Negative for polydipsia, polyphagia and polyuria.  Genitourinary: Negative for dysuria and frequency.  Musculoskeletal: Negative for gait problem, myalgias, neck pain and neck stiffness.  Skin: Negative for rash.  Neurological: Positive for dizziness, light-headedness and headaches. Negative for seizures, syncope, facial asymmetry, speech difficulty, weakness and numbness.  Hematological: Does not bruise/bleed easily.  Psychiatric/Behavioral: Negative for confusion.    Physical Exam Updated Vital Signs BP (!) 145/74    Pulse 70    Temp  98.5 F (36.9 C) (Oral)    Resp 12    Ht 5\' 7"  (1.702 m)    Wt 96.6 kg    LMP 07/18/2012    SpO2 100%    BMI 33.36 kg/m   Physical Exam Vitals signs and nursing note reviewed. Exam conducted with a chaperone present.  Constitutional:      General: She is not in acute distress. HENT:     Head: Normocephalic and atraumatic.     Right Ear: Tympanic membrane, ear canal and external ear normal.     Left Ear: Tympanic membrane, ear canal and external ear normal.     Nose: Nose normal. No congestion or rhinorrhea.     Right Sinus: No maxillary sinus tenderness or frontal sinus tenderness.     Left Sinus: No maxillary sinus tenderness or frontal sinus tenderness.     Mouth/Throat:     Mouth: Mucous membranes are moist.     Pharynx: No posterior oropharyngeal erythema.  Eyes:     Extraocular Movements: Extraocular movements  intact.     Conjunctiva/sclera: Conjunctivae normal.     Right eye: Right conjunctiva is not injected.     Left eye: Left conjunctiva is not injected.     Pupils: Pupils are equal, round, and reactive to light.  Neck:     Musculoskeletal: Normal range of motion and neck supple. No neck rigidity.  Cardiovascular:     Rate and Rhythm: Normal rate and regular rhythm.  Pulmonary:     Effort: Pulmonary effort is normal. No respiratory distress.     Breath sounds: Normal breath sounds. No wheezing, rhonchi or rales.  Abdominal:     Palpations: Abdomen is soft.     Tenderness: There is no abdominal tenderness.  Musculoskeletal: Normal range of motion.  Skin:    General: Skin is warm.     Findings: No rash.  Neurological:     Mental Status: She is alert.    Mental Status:  Alert, oriented, thought content appropriate, able to give a coherent history. Speech fluent without evidence of aphasia. Able to follow 2 step commands without difficulty.  Cranial Nerves:  II:  Peripheral visual fields grossly normal, pupils equal, round, reactive to light III,IV, VI: ptosis not  present, extra-ocular motions intact bilaterally  V,VII: smile symmetric, facial light touch sensation equal VIII: hearing grossly normal to voice  IX,X: symmetric elevation of soft palate, uvula elevates symmetrically  XI: bilateral shoulder shrug symmetric and strong XII: midline tongue extension without fassiculations Motor:  Normal tone. 5/5 in upper and lower extremities bilaterally including strong and equal grip strength and dorsiflexion/plantar flexion Sensory: light touch normal in all extremities.  Deep Tendon Reflexes: 2+ and symmetric in the biceps and patella Cerebellar: normal finger-to-nose with bilateral upper extremities Gait: normal gait and balance.  Negative pronator drift. Negative Romberg sign. CV: distal pulses palpable throughout   ED Treatments / Results  Labs (all labs ordered are listed, but only abnormal results are displayed) Labs Reviewed  CBC WITH DIFFERENTIAL/PLATELET - Abnormal; Notable for the following components:      Result Value   RBC 5.18 (*)    MCH 25.5 (*)    All other components within normal limits  BASIC METABOLIC PANEL - Abnormal; Notable for the following components:   Potassium 5.6 (*)    All other components within normal limits  URINALYSIS, ROUTINE W REFLEX MICROSCOPIC - Abnormal; Notable for the following components:   APPearance HAZY (*)    Leukocytes,Ua SMALL (*)    Bacteria, UA RARE (*)    All other components within normal limits  BASIC METABOLIC PANEL - Abnormal; Notable for the following components:   Sodium 134 (*)    CO2 20 (*)    All other components within normal limits  URINE CULTURE  BASIC METABOLIC PANEL    EKG EKG Interpretation  Date/Time:  Wednesday April 03 2018 14:57:06 EDT Ventricular Rate:  89 PR Interval:    QRS Duration: 86 QT Interval:  362 QTC Calculation: 441 R Axis:   77 Text Interpretation:  Sinus rhythm No previous ECGs available Confirmed by Theotis Burrow [phone]) on 04/03/2018 3:50:25  PM   Radiology No results found.  Procedures Procedures (including critical care time)  Medications Ordered in ED Medications  metoCLOPramide (REGLAN) injection 10 mg (10 mg Intravenous Given 04/03/18 1608)  ketorolac (TORADOL) 15 MG/ML injection 15 mg (15 mg Intravenous Given 04/03/18 1608)     Initial Impression / Assessment and Plan / ED Course  I have reviewed the triage vital  signs and the nursing notes.  Pertinent labs & imaging results that were available during my care of the patient were reviewed by me and considered in my medical decision making (see chart for details).  Clinical Course as of Apr 03 2006  Wed Apr 03, 2018  1652 Patient reports headache has completely resolved. Patient denies any other symptoms.   [AH]  2007 Repeat potassium is 3.6. Suspect initial sample was hemolyzed.   Potassium: 3.6 [AH]    Clinical Course User Index [AH] Arville Lime, PA-C      Patient presents with headache. Pt HA treated and improved while in ED.  Presentation is like pts typical HA and non concerning for [facility], ICH, Meningitis, or temporal arteritis. Pt is afebrile with no focal neuro deficits, nuchal rigidity, or change in vision. Headache resolved while in the ER. Pt is to follow up with PCP to discuss prophylactic medication. Patient had lightheadedness at work earlier today. Patient has been asymptomatic while in the ER and neurological exam is normal. Patient was also concerned about her blood pressure. Blood pressure improved while in the ER without medications. Patient denies any symptoms. Advised patient to follow up with PCP regarding blood pressure control. Pt verbalizes understanding and is agreeable with plan to dc.   Final Clinical Impressions(s) / ED Diagnoses   Final diagnoses:  Essential hypertension  Bad headache    ED Discharge Orders    None       Arville Lime, PA-C 04/03/18 2010    Little, Wenda Overland, MD 04/03/18 2252

## 2018-04-05 LAB — URINE CULTURE: Culture: >=100000 — AB

## 2018-04-08 ENCOUNTER — Encounter: Payer: Self-pay | Admitting: Internal Medicine

## 2018-04-08 ENCOUNTER — Other Ambulatory Visit: Payer: Self-pay

## 2018-04-08 ENCOUNTER — Other Ambulatory Visit (INDEPENDENT_AMBULATORY_CARE_PROVIDER_SITE_OTHER): Payer: 59

## 2018-04-08 ENCOUNTER — Ambulatory Visit (INDEPENDENT_AMBULATORY_CARE_PROVIDER_SITE_OTHER): Payer: 59 | Admitting: Internal Medicine

## 2018-04-08 VITALS — BP 152/84 | HR 70 | Temp 98.1°F | Resp 16 | Ht 67.0 in | Wt 220.0 lb

## 2018-04-08 DIAGNOSIS — G4452 New daily persistent headache (NDPH): Secondary | ICD-10-CM

## 2018-04-08 DIAGNOSIS — M65311 Trigger thumb, right thumb: Secondary | ICD-10-CM | POA: Insufficient documentation

## 2018-04-08 DIAGNOSIS — R7303 Prediabetes: Secondary | ICD-10-CM | POA: Diagnosis not present

## 2018-04-08 DIAGNOSIS — I10 Essential (primary) hypertension: Secondary | ICD-10-CM | POA: Diagnosis not present

## 2018-04-08 DIAGNOSIS — E118 Type 2 diabetes mellitus with unspecified complications: Secondary | ICD-10-CM

## 2018-04-08 LAB — BASIC METABOLIC PANEL
BUN: 21 mg/dL (ref 6–23)
CO2: 27 mEq/L (ref 19–32)
Calcium: 9.3 mg/dL (ref 8.4–10.5)
Chloride: 105 mEq/L (ref 96–112)
Creatinine, Ser: 0.82 mg/dL (ref 0.40–1.20)
GFR: 85.75 mL/min (ref >60.00)
Glucose, Bld: 99 mg/dL (ref 70–99)
Potassium: 4.2 mEq/L (ref 3.5–5.1)
Sodium: 139 mEq/L (ref 135–145)

## 2018-04-08 LAB — HEMOGLOBIN A1C: Hgb A1c MFr Bld: 6.5 % (ref 4.6–6.5)

## 2018-04-08 LAB — SEDIMENTATION RATE: Sed Rate: 56 mm/hr — ABNORMAL HIGH (ref 0–30)

## 2018-04-08 MED ORDER — NEBIVOLOL HCL 5 MG PO TABS: 5.0000 mg | ORAL_TABLET | Freq: Every day | ORAL | 1 refills | Status: DC

## 2018-04-08 NOTE — Progress Notes (Signed)
Subjective:  Patient ID: [name], female    DOB: 1955-02-02  Age: 63 y.o. MRN: [id number]  CC: Hypertension and Headache   HPI AVREY FLANAGIN presents for f/up - She was recently seen in the ED complaining of a one-month history of frontal headache.  She says that Tylenol helps decrease the headache.  The pain is not located in her temples and she has had no visual disturbance.  She describes the pain as a tightness.  The pain is constant and varies in intensity.  The headache is there throughout the day, is present when she goes to bed, and is present when she wakes up.  The headache does not keep her awake at night.  She denies changes in her vision or hearing.  She denies nausea, vomiting, or paresthesias.  She complains of a trigger mechanism in her right thumb that is painful and interferes with her range of motion.  She does not monitor her blood pressure but she tells me she is compliant with the current antihypertensive.  Outpatient Medications Prior to Visit  Medication Sig Dispense Refill  . aspirin EC 325 MG tablet Take 1 tablet (325 mg total) by mouth daily. For 30 days post op for DVT Prophylaxis 30 tablet 0  . atorvastatin (LIPITOR) 40 MG tablet TAKE 1 TABLET BY MOUTH EVERY DAY (Patient taking differently: Take 40 mg by mouth daily at 6 PM. ) 30 tablet 5  . Calcium Carbonate-Vitamin D (CALTRATE 600+D) 600-400 MG-UNIT per tablet Take 1 tablet by mouth daily.     . Cholecalciferol (VITAMIN D3) 1.25 MG (50000 UT) CAPS TAKE ONE CAPSULE BY MOUTH ONE TIME PER WEEK (Patient taking differently: Take 50,000 Units by mouth every 7 (seven) days. ) 12 capsule 0  . meloxicam (MOBIC) 15 MG tablet TAKE 1 TABLET BY MOUTH EVERY DAY (Patient taking differently: Take 15 mg by mouth daily. ) 30 tablet 2  . Multiple Vitamins-Minerals (MULTIVITAMIN WITH MINERALS) tablet Take 1 tablet by mouth daily.    Marland Kitchen olmesartan-hydrochlorothiazide (BENICAR HCT) 20-12.5 MG tablet Take 1 tablet by mouth daily.  90 tablet 1  . omeprazole (PRILOSEC) 20 MG capsule Take 1 capsule (20 mg total) by mouth daily. 30 days for gastroprotection while taking Aspirin. 30 capsule 0  . Plecanatide (TRULANCE) 3 MG TABS Take 1 tablet by mouth daily. (Patient taking differently: Take 3 mg by mouth daily. ) 90 tablet 1  . triamcinolone ointment (KENALOG) 0.1 % Apply 1 application topically 2 (two) times daily. 15 g 1   No facility-administered medications prior to visit.     ROS Review of Systems  Constitutional: Negative.  Negative for appetite change, diaphoresis, fatigue and unexpected weight change.  HENT: Negative.  Negative for trouble swallowing.   Eyes: Negative for photophobia, pain and visual disturbance.  Respiratory: Negative for cough, chest tightness, shortness of breath and wheezing.   Cardiovascular: Negative for chest pain, palpitations and leg swelling.  Gastrointestinal: Negative.  Negative for abdominal pain, diarrhea, nausea and vomiting.  Endocrine: Negative.   Genitourinary: Negative.  Negative for difficulty urinating, dysuria and urgency.  Musculoskeletal: Negative.  Negative for arthralgias, myalgias and neck pain.  Skin: Negative.   Neurological: Positive for headaches. Negative for dizziness, seizures, speech difficulty, weakness, light-headedness and numbness.  Hematological: Negative for adenopathy. Does not bruise/bleed easily.  Psychiatric/Behavioral: Negative.     Objective:  BP (!) 152/84 (BP Location: Left Arm, Patient Position: Sitting, Cuff Size: Large)   Pulse 70   Temp  98.1 F (36.7 C) (Oral)   Resp 16   Ht 5\' 7"  (1.702 m)   Wt 220 lb (99.8 kg)   LMP 07/18/2012   SpO2 99%   BMI 34.46 kg/m   BP Readings from Last 3 Encounters:  04/08/18 (!) 152/84  04/03/18 123/73  12/03/17 (!) 152/88    Wt Readings from Last 3 Encounters:  04/08/18 220 lb (99.8 kg)  04/03/18 213 lb (96.6 kg)  08/30/17 216 lb 4 oz (98.1 kg)    Physical Exam Vitals signs reviewed.   Constitutional:      General: She is not in acute distress.    Appearance: She is obese. She is not ill-appearing, toxic-appearing or diaphoretic.  HENT:     Head: Normocephalic and atraumatic.     Jaw: No tenderness.     Comments: Her scalp is non-tender    Mouth/Throat:     Mouth: Mucous membranes are moist.     Pharynx: Oropharynx is clear.  Eyes:     General: No scleral icterus.    Extraocular Movements: Extraocular movements intact.     Right eye: Normal extraocular motion and no nystagmus.     Left eye: Normal extraocular motion and no nystagmus.     Pupils: Pupils are equal, round, and reactive to light. Pupils are equal.     Right eye: Pupil is round and reactive.     Left eye: Pupil is round and reactive.  Neck:     Musculoskeletal: Normal range of motion and neck supple. No neck rigidity.  Cardiovascular:     Rate and Rhythm: Normal rate and regular rhythm.     Heart sounds: Normal heart sounds. No murmur. No gallop.   Pulmonary:     Effort: Pulmonary effort is normal.     Breath sounds: No stridor. No wheezing, rhonchi or rales.  Abdominal:     General: Bowel sounds are normal. There is no distension.     Palpations: Abdomen is soft. There is no mass.     Tenderness: There is no abdominal tenderness. There is no guarding.  Musculoskeletal: Normal range of motion.        General: No swelling or tenderness.  Lymphadenopathy:     Cervical: No cervical adenopathy.  Skin:    General: Skin is warm and dry.  Neurological:     General: No focal deficit present.     Mental Status: She is alert and oriented to person, place, and time. Mental status is at baseline.     Cranial Nerves: Cranial nerves are intact. No cranial nerve deficit, dysarthria or facial asymmetry.     Sensory: Sensation is intact. No sensory deficit.     Motor: Motor function is intact. No weakness, tremor, atrophy or seizure activity.     Coordination: Coordination is intact. Romberg sign negative.  Coordination normal.     Gait: Gait is intact.     Deep Tendon Reflexes: Reflexes normal. Babinski sign absent on the right side. Babinski sign absent on the left side.     Reflex Scores:      Tricep reflexes are 0 on the right side and 0 on the left side.      Bicep reflexes are 0 on the right side and 0 on the left side.      Brachioradialis reflexes are 0 on the right side and 0 on the left side.      Patellar reflexes are 1+ on the right side and 1+ on the left  side.      Achilles reflexes are 0 on the right side and 0 on the left side. Psychiatric:        Mood and Affect: Mood normal.        Behavior: Behavior normal.     Lab Results  Component Value Date   WBC 9.2 04/03/2018   HGB 13.2 04/03/2018   HCT 42.8 04/03/2018   PLT 253 04/03/2018   GLUCOSE 99 04/08/2018   CHOL 201 (H) 08/30/2017   TRIG 108.0 08/30/2017   HDL 42.70 08/30/2017   LDLDIRECT 167.9 11/09/2010   LDLCALC 137 (H) 08/30/2017   ALT 12 02/19/2017   AST 14 02/19/2017   NA 139 04/08/2018   K 4.2 04/08/2018   CL 105 04/08/2018   CREATININE 0.82 04/08/2018   BUN 21 04/08/2018   CO2 27 04/08/2018   TSH 1.34 02/19/2017   HGBA1C 6.5 04/08/2018   MICROALBUR 80 08/30/2017    No results found.  Assessment & Plan:   Tyyonna was seen today for hypertension and headache.  Diagnoses and all orders for this visit:  Trigger thumb of right hand -     Ambulatory referral to Orthopedic Surgery  Essential hypertension- Her BP is not adequately well controlled and this may be contributing to her headaches so I have asked her to add nebivolol to the ARB and thiazide diuretic. -     Basic metabolic panel; Future -     nebivolol (BYSTOLIC) 5 MG tablet; Take 1 tablet (5 mg total) by mouth daily.  Prediabetes-her A1c is up to 6.5%. -     Basic metabolic panel; Future -     Hemoglobin A1c; Future  New daily persistent headache- She has a new onset headache and is over the age of 24 so I think it is prudent to do an  MRI of the brain to be certain that she does not have a mass or tumor.  Her headache does not fit the description of temporal arteritis and she has had the headache for a month and has had no visual loss. Also, considering her age her sed rate is not  significantly elevated.  I therefore do not think she needs to take a course of steroids for temporal arteritis.  She is getting symptom relief with Tylenol.  I will also work to get better control of her blood pressure. -     Sedimentation rate; Future -     MR Brain Wo Contrast; Future  Type II diabetes mellitus with manifestations ([place])- Her A1c is up to 6.5%.  Medical therapy is not indicated.  She was encouraged to improve her lifestyle modifications. -     HM Diabetes Foot Exam -     Ambulatory referral to Ophthalmology   I am having Fayne L. Ruacho start on nebivolol. I am also having her maintain her Calcium Carbonate-Vitamin D, multivitamin with minerals, aspirin EC, omeprazole, olmesartan-hydrochlorothiazide, Plecanatide, meloxicam, triamcinolone ointment, Vitamin D3, and atorvastatin.  Meds ordered this encounter  Medications  . nebivolol (BYSTOLIC) 5 MG tablet    Sig: Take 1 tablet (5 mg total) by mouth daily.    Dispense:  90 tablet    Refill:  1     Follow-up: Return in about 3 months (around 07/08/2018).  Scarlette Calico, MD

## 2018-04-08 NOTE — Patient Instructions (Signed)
General Headache Without Cause A headache is pain or discomfort that is felt around the head or neck area. There are many causes and types of headaches. In some cases, the cause may not be found. Follow these instructions at home: Watch your condition for any changes. Let your doctor know about them. Take these steps to help with your condition: Managing pain      Take over-the-counter and prescription medicines only as told by your doctor.  Lie down in a dark, quiet room when you have a headache.  If told, put ice on your head and neck area: ? Put ice in a plastic bag. ? Place a towel between your skin and the bag. ? Leave the ice on for 20 minutes, 2-3 times per day.  If told, put heat on the affected area. Use the heat source that your doctor recommends, such as a moist heat pack or a heating pad. ? Place a towel between your skin and the heat source. ? Leave the heat on for 20-30 minutes. ? Remove the heat if your skin turns bright red. This is very important if you are unable to feel pain, heat, or cold. You may have a greater risk of getting burned.  Keep lights dim if bright lights bother you or make your headaches worse. Eating and drinking  Eat meals on a regular schedule.  If you drink alcohol: ? Limit how much you use to:  0-1 drink a day for women.  0-2 drinks a day for men. ? Be aware of how much alcohol is in your drink. In the [place] one drink equals one 12 oz bottle of beer (355 mL), one 5 oz glass of wine (148 mL), or one 1 oz glass of hard liquor (44 mL).  Stop drinking caffeine, or reduce how much caffeine you drink. General instructions   Keep a journal to find out if certain things bring on headaches. For example, write down: ? What you eat and drink. ? How much sleep you get. ? Any change to your diet or medicines.  Get a massage or try other ways to relax.  Limit stress.  Sit up straight. Do not tighten (tense) your muscles.  Do not use any  products that contain nicotine or tobacco. This includes cigarettes, e-cigarettes, and chewing tobacco. If you need help quitting, ask your doctor.  Exercise regularly as told by your doctor.  Get enough sleep. This often means 7-9 hours of sleep each night.  Keep all follow-up visits as told by your doctor. This is important. Contact a doctor if:  Your symptoms are not helped by medicine.  You have a headache that feels different than the other headaches.  You feel sick to your stomach (nauseous) or you throw up (vomit).  You have a fever. Get help right away if:  Your headache gets very bad quickly.  Your headache gets worse after a lot of physical activity.  You keep throwing up.  You have a stiff neck.  You have trouble seeing.  You have trouble speaking.  You have pain in the eye or ear.  Your muscles are weak or you lose muscle control.  You lose your balance or have trouble walking.  You feel like you will pass out (faint) or you pass out.  You are mixed up (confused).  You have a seizure. Summary  A headache is pain or discomfort that is felt around the head or neck area.  There are many causes and   types of headaches. In some cases, the cause may not be found.  Keep a journal to help find out what causes your headaches. Watch your condition for any changes. Let your doctor know about them.  Contact a doctor if you have a headache that is different from usual, or if your headache is not helped by medicine.  Get help right away if your headache gets very bad, you throw up, you have trouble seeing, you lose your balance, or you have a seizure. This information is not intended to replace advice given to you by your health care provider. Make sure you discuss any questions you have with your health care provider. Document Released: 09/28/2007 Document Revised: 07/09/2017 Document Reviewed: 07/09/2017 Elsevier Interactive Patient Education  2019 Elsevier  Inc.  

## 2018-04-12 ENCOUNTER — Ambulatory Visit
Admission: RE | Admit: 2018-04-12 | Discharge: 2018-04-12 | Disposition: A | Discharge status: Home / Self Care | Payer: 59 | Source: Ambulatory Visit | Attending: Internal Medicine | Admitting: Internal Medicine

## 2018-04-12 ENCOUNTER — Other Ambulatory Visit: Payer: Self-pay

## 2018-04-12 DIAGNOSIS — G4452 New daily persistent headache (NDPH): Secondary | ICD-10-CM

## 2018-04-15 ENCOUNTER — Encounter: Payer: Self-pay | Admitting: Internal Medicine

## 2018-04-19 ENCOUNTER — Ambulatory Visit (INDEPENDENT_AMBULATORY_CARE_PROVIDER_SITE_OTHER): Payer: Self-pay | Admitting: Orthopedic Surgery

## 2018-05-11 ENCOUNTER — Other Ambulatory Visit: Payer: Self-pay | Admitting: Internal Medicine

## 2018-05-11 DIAGNOSIS — M1610 Unilateral primary osteoarthritis, unspecified hip: Secondary | ICD-10-CM

## 2018-05-17 ENCOUNTER — Other Ambulatory Visit: Payer: Self-pay | Admitting: Internal Medicine

## 2018-05-17 DIAGNOSIS — I10 Essential (primary) hypertension: Secondary | ICD-10-CM

## 2018-06-27 LAB — HM DIABETES EYE EXAM

## 2018-08-06 ENCOUNTER — Other Ambulatory Visit: Payer: Self-pay | Admitting: Internal Medicine

## 2018-08-06 DIAGNOSIS — M1610 Unilateral primary osteoarthritis, unspecified hip: Secondary | ICD-10-CM

## 2018-09-28 ENCOUNTER — Other Ambulatory Visit: Payer: Self-pay | Admitting: Internal Medicine

## 2018-09-28 DIAGNOSIS — I10 Essential (primary) hypertension: Secondary | ICD-10-CM

## 2018-10-16 ENCOUNTER — Encounter: Payer: Self-pay | Admitting: Internal Medicine

## 2018-10-16 ENCOUNTER — Ambulatory Visit (INDEPENDENT_AMBULATORY_CARE_PROVIDER_SITE_OTHER): Payer: Self-pay | Admitting: Internal Medicine

## 2018-10-16 ENCOUNTER — Other Ambulatory Visit: Payer: Self-pay

## 2018-10-16 ENCOUNTER — Other Ambulatory Visit (INDEPENDENT_AMBULATORY_CARE_PROVIDER_SITE_OTHER): Payer: Self-pay

## 2018-10-16 ENCOUNTER — Ambulatory Visit (HOSPITAL_COMMUNITY): Admission: EM | Admit: 2018-10-16 | Discharge: 2018-10-16 | Discharge status: Against Medical Advice | Payer: 59

## 2018-10-16 VITALS — BP 146/86 | HR 72 | Temp 99.2°F | Resp 16 | Ht 67.0 in | Wt 218.1 lb

## 2018-10-16 DIAGNOSIS — E559 Vitamin D deficiency, unspecified: Secondary | ICD-10-CM

## 2018-10-16 DIAGNOSIS — E118 Type 2 diabetes mellitus with unspecified complications: Secondary | ICD-10-CM

## 2018-10-16 DIAGNOSIS — Z111 Encounter for screening for respiratory tuberculosis: Secondary | ICD-10-CM

## 2018-10-16 DIAGNOSIS — M7500 Adhesive capsulitis of unspecified shoulder: Secondary | ICD-10-CM

## 2018-10-16 DIAGNOSIS — Z23 Encounter for immunization: Secondary | ICD-10-CM

## 2018-10-16 DIAGNOSIS — I10 Essential (primary) hypertension: Secondary | ICD-10-CM

## 2018-10-16 DIAGNOSIS — E785 Hyperlipidemia, unspecified: Secondary | ICD-10-CM

## 2018-10-16 DIAGNOSIS — E11618 Type 2 diabetes mellitus with other diabetic arthropathy: Secondary | ICD-10-CM

## 2018-10-16 LAB — BASIC METABOLIC PANEL
BUN: 15 mg/dL (ref 6–23)
CO2: 26 mEq/L (ref 19–32)
Calcium: 9.6 mg/dL (ref 8.4–10.5)
Chloride: 106 mEq/L (ref 96–112)
Creatinine, Ser: 0.77 mg/dL (ref 0.40–1.20)
GFR: 92.05 mL/min (ref >60.00)
Glucose, Bld: 101 mg/dL — ABNORMAL HIGH (ref 70–99)
Potassium: 3.8 mEq/L (ref 3.5–5.1)
Sodium: 141 mEq/L (ref 135–145)

## 2018-10-16 LAB — CBC WITH DIFFERENTIAL/PLATELET
Basophils Absolute: 0 10*3/uL (ref 0.0–0.1)
Basophils Relative: 0.4 % (ref 0.0–3.0)
Eosinophils Absolute: 0.1 10*3/uL (ref 0.0–0.7)
Eosinophils Relative: 1.7 % (ref 0.0–5.0)
HCT: 39.9 % (ref 36.0–46.0)
Hemoglobin: 12.8 g/dL (ref 12.0–15.0)
Lymphocytes Relative: 35.4 % (ref 12.0–46.0)
Lymphs Abs: 2.3 10*3/uL (ref 0.7–4.0)
MCHC: 32.1 g/dL (ref 30.0–36.0)
MCV: 81.5 fl (ref 78.0–100.0)
Monocytes Absolute: 0.4 10*3/uL (ref 0.1–1.0)
Monocytes Relative: 6.5 % (ref 3.0–12.0)
Neutro Abs: 3.7 10*3/uL (ref 1.4–7.7)
Neutrophils Relative %: 56 % (ref 43.0–77.0)
Platelets: 197 10*3/uL (ref 150.0–400.0)
RBC: 4.9 Mil/uL (ref 3.87–5.11)
RDW: 15.9 % — ABNORMAL HIGH (ref 11.5–15.5)
WBC: 6.6 10*3/uL (ref 4.0–10.5)

## 2018-10-16 LAB — HEPATIC FUNCTION PANEL
ALT: 12 U/L (ref 0–35)
AST: 13 U/L (ref 0–37)
Albumin: 4.2 g/dL (ref 3.5–5.2)
Alkaline Phosphatase: 53 U/L (ref 39–117)
Bilirubin, Direct: 0 mg/dL (ref 0.0–0.3)
Total Bilirubin: 0.3 mg/dL (ref 0.2–1.2)
Total Protein: 7.5 g/dL (ref 6.0–8.3)

## 2018-10-16 LAB — LIPID PANEL
Cholesterol: 254 mg/dL — ABNORMAL HIGH (ref 0–200)
HDL: 43.4 mg/dL (ref >39.00)
LDL Cholesterol: 177 mg/dL — ABNORMAL HIGH (ref 0–99)
NonHDL: 210.64
Total CHOL/HDL Ratio: 6
Triglycerides: 170 mg/dL — ABNORMAL HIGH (ref 0.0–149.0)
VLDL: 34 mg/dL (ref 0.0–40.0)

## 2018-10-16 LAB — VITAMIN D 25 HYDROXY (VIT D DEFICIENCY, FRACTURES): VITD: 32.41 ng/mL (ref 30.00–100.00)

## 2018-10-16 LAB — HEMOGLOBIN A1C: Hgb A1c MFr Bld: 6.6 % — ABNORMAL HIGH (ref 4.6–6.5)

## 2018-10-16 LAB — URINALYSIS, ROUTINE W REFLEX MICROSCOPIC
Bilirubin Urine: NEGATIVE
Hgb urine dipstick: NEGATIVE
Ketones, ur: NEGATIVE
Nitrite: NEGATIVE
Specific Gravity, Urine: >=1.03 — AB (ref 1.000–1.030)
Total Protein, Urine: NEGATIVE
Urine Glucose: NEGATIVE
Urobilinogen, UA: 0.2 (ref 0.0–1.0)
pH: 5.5 (ref 5.0–8.0)

## 2018-10-16 LAB — MICROALBUMIN / CREATININE URINE RATIO
Creatinine,U: 210 mg/dL
Microalb Creat Ratio: 0.5 mg/g (ref 0.0–30.0)
Microalb, Ur: 1 mg/dL (ref 0.0–1.9)

## 2018-10-16 LAB — TSH: TSH: 1.2 u[IU]/mL (ref 0.35–4.50)

## 2018-10-16 MED ORDER — ATORVASTATIN CALCIUM 40 MG PO TABS: 40.0000 mg | ORAL_TABLET | Freq: Every day | ORAL | 1 refills | Status: DC

## 2018-10-16 MED ORDER — OLMESARTAN MEDOXOMIL-HCTZ 20-12.5 MG PO TABS: 1.0000 | ORAL_TABLET | Freq: Every day | ORAL | 1 refills | Status: DC

## 2018-10-16 NOTE — Patient Instructions (Signed)
Type 2 Diabetes Mellitus, Diagnosis, Adult Type 2 diabetes (type 2 diabetes mellitus) is a long-term (chronic) disease. In type 2 diabetes, one or both of these problems may be present:  The pancreas does not make enough of a hormone called insulin.  Cells in the body do not respond properly to insulin that the body makes (insulin resistance). Normally, insulin allows blood sugar (glucose) to enter cells in the body. The cells use glucose for energy. Insulin resistance or lack of insulin causes excess glucose to build up in the blood instead of going into cells. As a result, high blood glucose (hyperglycemia) develops. What increases the risk? The following factors may make you more likely to develop type 2 diabetes:  Having a family member with type 2 diabetes.  Being overweight or obese.  Having an inactive (sedentary) lifestyle.  Having been diagnosed with insulin resistance.  Having a history of prediabetes, gestational diabetes, or polycystic ovary syndrome (PCOS).  Being of American-Indian, African-American, Hispanic/Latino, or Asian/Pacific Islander descent. What are the signs or symptoms? In the early stage of this condition, you may not have symptoms. Symptoms develop slowly and may include:  Increased thirst (polydipsia).  Increased hunger(polyphagia).  Increased urination (polyuria).  Increased urination during the night (nocturia).  Unexplained weight loss.  Frequent infections that keep coming back (recurring).  Fatigue.  Weakness.  Vision changes, such as blurry vision.  Cuts or bruises that are slow to heal.  Tingling or numbness in the hands or feet.  Dark patches on the skin (acanthosis nigricans). How is this diagnosed? This condition is diagnosed based on your symptoms, your medical history, a physical exam, and your blood glucose level. Your blood glucose may be checked with one or more of the following blood tests:  A fasting blood glucose (FBG)  test. You will not be allowed to eat (you will fast) for 8 hours or longer before a blood sample is taken.  A random blood glucose test. This test checks blood glucose at any time of day regardless of when you ate.  An A1c (hemoglobin A1c) blood test. This test provides information about blood glucose control over the previous 2-3 months.  An oral glucose tolerance test (OGTT). This test measures your blood glucose at two times: ? After fasting. This is your baseline blood glucose level. ? Two hours after drinking a beverage that contains glucose. You may be diagnosed with type 2 diabetes if:  Your FBG level is 126 mg/dL (7.0 mmol/L) or higher.  Your random blood glucose level is 200 mg/dL (11.1 mmol/L) or higher.  Your A1c level is 6.5% or higher.  Your OGTT result is higher than 200 mg/dL (11.1 mmol/L). These blood tests may be repeated to confirm your diagnosis. How is this treated? Your treatment may be managed by a specialist called an endocrinologist. Type 2 diabetes may be treated by following instructions from your health care provider about:  Making diet and lifestyle changes. This may include: ? Following an individualized nutrition plan that is developed by a diet and nutrition specialist (registered dietitian). ? Exercising regularly. ? Finding ways to manage stress.  Checking your blood glucose level as often as told.  Taking diabetes medicines or insulin daily. This helps to keep your blood glucose levels in the healthy range. ? If you use insulin, you may need to adjust the dosage depending on how physically active you are and what foods you eat. Your health care provider will tell you how to adjust your dosage.    Taking medicines to help prevent complications from diabetes, such as: ? Aspirin. ? Medicine to lower cholesterol. ? Medicine to control blood pressure. Your health care provider will set individualized treatment goals for you. Your goals will be based on  your age, other medical conditions you have, and how you respond to diabetes treatment. Generally, the goal of treatment is to maintain the following blood glucose levels:  Before meals (preprandial): 80-130 mg/dL (4.4-7.2 mmol/L).  After meals (postprandial): below 180 mg/dL (10 mmol/L).  A1c level: less than 7%. Follow these instructions at home: Questions to ask your health care provider  Consider asking the following questions: ? Do I need to meet with a diabetes educator? ? Where can I find a support group for people with diabetes? ? What equipment will I need to manage my diabetes at home? ? What diabetes medicines do I need, and when should I take them? ? How often do I need to check my blood glucose? ? What number can I call if I have questions? ? When is my next appointment? General instructions  Take over-the-counter and prescription medicines only as told by your health care provider.  Keep all follow-up visits as told by your health care provider. This is important.  For more information about diabetes, visit: ? American Diabetes Association (ADA): www.diabetes.org ? American Association of Diabetes Educators (AADE): www.diabeteseducator.org Contact a health care provider if:  Your blood glucose is at or above 240 mg/dL (13.3 mmol/L) for 2 days in a row.  You have been sick or have had a fever for 2 days or longer, and you are not getting better.  You have any of the following problems for more than 6 hours: ? You cannot eat or drink. ? You have nausea and vomiting. ? You have diarrhea. Get help right away if:  Your blood glucose is lower than 54 mg/dL (3.0 mmol/L).  You become confused or you have trouble thinking clearly.  You have difficulty breathing.  You have moderate or large ketone levels in your urine. Summary  Type 2 diabetes (type 2 diabetes mellitus) is a long-term (chronic) disease. In type 2 diabetes, the pancreas does not make enough of a  hormone called insulin, or cells in the body do not respond properly to insulin that the body makes (insulin resistance).  This condition is treated by making diet and lifestyle changes and taking diabetes medicines or insulin.  Your health care provider will set individualized treatment goals for you. Your goals will be based on your age, other medical conditions you have, and how you respond to diabetes treatment.  Keep all follow-up visits as told by your health care provider. This is important. This information is not intended to replace advice given to you by your health care provider. Make sure you discuss any questions you have with your health care provider. Document Released: 12/19/2004 Document Revised: 02/16/2017 Document Reviewed: 01/22/2015 Elsevier Patient Education  2020 Elsevier Inc.  

## 2018-10-16 NOTE — Progress Notes (Signed)
Subjective:  Patient ID: [name], female    DOB: 1955/11/22  Age: 63 y.o. MRN: [id number]  CC: Hypertension, Hyperlipidemia, and Diabetes   HPI [name] presents for f/up - She feels well today and offers no complaints.  She is not compliant with several of her meds.  She does not monitor her blood pressure or blood sugar.  She denies any recent episodes of headache, blurred vision, chest pain, shortness of breath, palpitations, edema, fatigue, or polys.  Outpatient Medications Prior to Visit  Medication Sig Dispense Refill  . aspirin EC 325 MG tablet Take 1 tablet (325 mg total) by mouth daily. For 30 days post op for DVT Prophylaxis 30 tablet 0  . Calcium Carbonate-Vitamin D (CALTRATE 600+D) 600-400 MG-UNIT per tablet Take 1 tablet by mouth daily.     . Cholecalciferol (VITAMIN D3) 1.25 MG (50000 UT) CAPS TAKE ONE CAPSULE BY MOUTH ONE TIME PER WEEK (Patient taking differently: Take 50,000 Units by mouth every 7 (seven) days. ) 12 capsule 0  . meloxicam (MOBIC) 15 MG tablet TAKE 1 TABLET BY MOUTH EVERY DAY 90 tablet 0  . Multiple Vitamins-Minerals (MULTIVITAMIN WITH MINERALS) tablet Take 1 tablet by mouth daily.    . nebivolol (BYSTOLIC) 5 MG tablet Take 1 tablet (5 mg total) by mouth daily. 90 tablet 1  . omeprazole (PRILOSEC) 20 MG capsule Take 1 capsule (20 mg total) by mouth daily. 30 days for gastroprotection while taking Aspirin. 30 capsule 0  . atorvastatin (LIPITOR) 40 MG tablet TAKE 1 TABLET BY MOUTH EVERY DAY (Patient taking differently: Take 40 mg by mouth daily at 6 PM. ) 30 tablet 5  . olmesartan-hydrochlorothiazide (BENICAR HCT) 20-12.5 MG tablet Take 1 tablet by mouth daily. Follow-up appt due in July must see provider for future refills 30 tablet 2  . Plecanatide (TRULANCE) 3 MG TABS Take 1 tablet by mouth daily. (Patient taking differently: Take 3 mg by mouth daily. ) 90 tablet 1  . triamcinolone ointment (KENALOG) 0.1 % Apply 1 application topically 2 (two) times  daily. 15 g 1   No facility-administered medications prior to visit.     ROS Review of Systems  Constitutional: Negative for appetite change, diaphoresis, fatigue and unexpected weight change.  HENT: Negative.   Eyes: Negative for visual disturbance.  Respiratory: Negative for cough, chest tightness, shortness of breath and wheezing.   Cardiovascular: Negative for chest pain, palpitations and leg swelling.  Gastrointestinal: Negative for abdominal pain, constipation, diarrhea, nausea and vomiting.  Endocrine: Negative.  Negative for polydipsia, polyphagia and polyuria.  Genitourinary: Negative.  Negative for difficulty urinating.  Musculoskeletal: Negative for arthralgias and myalgias.  Skin: Negative.  Negative for color change and pallor.  Neurological: Negative.  Negative for dizziness, weakness and light-headedness.  Hematological: Negative for adenopathy. Does not bruise/bleed easily.  Psychiatric/Behavioral: Negative.     Objective:  BP (!) 146/86 (BP Location: Left Arm, Patient Position: Sitting, Cuff Size: Large)   Pulse 72   Temp 99.2 F (37.3 C) (Oral)   Resp 16   Ht 5\' 7"  (1.702 m)   Wt 218 lb 2 oz (98.9 kg)   LMP 07/18/2012   SpO2 97%   BMI 34.16 kg/m   BP Readings from Last 3 Encounters:  10/16/18 (!) 146/86  04/08/18 (!) 152/84  04/03/18 123/73    Wt Readings from Last 3 Encounters:  10/16/18 218 lb 2 oz (98.9 kg)  04/08/18 220 lb (99.8 kg)  04/03/18 213 lb (96.6 kg)  Physical Exam Vitals signs reviewed.  Constitutional:      Appearance: She is obese. She is not ill-appearing or diaphoretic.  HENT:     Nose: Nose normal.     Mouth/Throat:     Mouth: Mucous membranes are moist.  Eyes:     General: No scleral icterus.    Conjunctiva/sclera: Conjunctivae normal.  Neck:     Musculoskeletal: Neck supple.  Cardiovascular:     Rate and Rhythm: Normal rate and regular rhythm.     Heart sounds: No murmur.  Pulmonary:     Effort: Pulmonary effort  is normal.     Breath sounds: No stridor. No wheezing, rhonchi or rales.  Abdominal:     General: Abdomen is protuberant. Bowel sounds are normal. There is no distension.     Palpations: Abdomen is soft. There is no hepatomegaly or splenomegaly.     Tenderness: There is no abdominal tenderness.  Musculoskeletal: Normal range of motion.     Right lower leg: No edema.     Left lower leg: No edema.  Lymphadenopathy:     Cervical: No cervical adenopathy.  Skin:    General: Skin is warm and dry.     Coloration: Skin is not pale.  Neurological:     General: No focal deficit present.     Mental Status: She is alert.  Psychiatric:        Mood and Affect: Mood normal.        Behavior: Behavior normal.     Lab Results  Component Value Date   WBC 6.6 10/16/2018   HGB 12.8 10/16/2018   HCT 39.9 10/16/2018   PLT 197.0 10/16/2018   GLUCOSE 101 (H) 10/16/2018   CHOL 254 (H) 10/16/2018   TRIG 170.0 (H) 10/16/2018   HDL 43.40 10/16/2018   LDLDIRECT 167.9 11/09/2010   LDLCALC 177 (H) 10/16/2018   ALT 12 10/16/2018   AST 13 10/16/2018   NA 141 10/16/2018   K 3.8 10/16/2018   CL 106 10/16/2018   CREATININE 0.77 10/16/2018   BUN 15 10/16/2018   CO2 26 10/16/2018   TSH 1.20 10/16/2018   HGBA1C 6.6 (H) 10/16/2018   MICROALBUR 1.0 10/16/2018    Mr Brain Wo Contrast  Result Date: 04/12/2018 CLINICAL DATA:  Frontal headaches for 1 month. EXAM: MRI HEAD WITHOUT CONTRAST TECHNIQUE: Multiplanar, multiecho pulse sequences of the brain and surrounding structures were obtained without intravenous contrast. COMPARISON:  Head CT 10/09/2007 FINDINGS: Brain: There is no evidence of acute infarct, intracranial hemorrhage, mass, midline shift, or extra-axial fluid collection. The ventricles and sulci are normal. Small T2 hyperintensities throughout the cerebral white matter bilaterally are nonspecific but compatible with mild-to-moderate chronic small vessel ischemic disease given the patient's vascular  risk factors. Vascular: Major intracranial vascular flow voids are preserved. Skull and upper cervical spine: Unremarkable bone marrow signal. Sinuses/Orbits: Unremarkable orbits. Paranasal sinuses and mastoid air cells are clear. Other: None. IMPRESSION: 1. No acute intracranial abnormality. 2. Mild-to-moderate chronic small vessel ischemic disease. Electronically Signed   By: Logan Bores M.D.   On: 04/12/2018 09:51    Assessment & Plan:   April was seen today for hypertension, hyperlipidemia and diabetes.  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 -     HM Diabetes Foot Exam -     Microalbumin / creatinine urine ratio; Future -     Basic metabolic panel; Future -     Hemoglobin A1c; Future -     olmesartan-hydrochlorothiazide (BENICAR HCT) 20-12.5 MG tablet; Take 1 tablet by mouth daily.  Essential hypertension- Her blood pressure is not well controlled due to noncompliance.  I have asked her to restart the ARB/thiazide diuretic combination. -     CBC with Differential/Platelet; Future -     Basic metabolic panel; Future -     Urinalysis, Routine w reflex microscopic; Future -     olmesartan-hydrochlorothiazide (BENICAR HCT) 20-12.5 MG tablet; Take 1 tablet by mouth daily.  Hyperlipidemia with target LDL less than 130- She has not achieved her LDL goal and is not compliant with the statin.  I have asked her to restart atorvastatin. -     Lipid panel; Future -     TSH; Future -     Hepatic function panel; Future -     atorvastatin (LIPITOR) 40 MG tablet; Take 1 tablet (40 mg total) by mouth daily.  Vitamin D deficiency disease- Her vitamin D level is normal now. -     VITAMIN D 25 Hydroxy (Vit-D  Deficiency, Fractures); Future  Type 2 diabetes mellitus with complication, without long-term current use of insulin ([facility]) -     atorvastatin (LIPITOR) 40 MG tablet; Take 1 tablet (40 mg total) by mouth daily.   I have discontinued Elveta L. Klutts's Plecanatide and triamcinolone ointment. I have also changed her atorvastatin and olmesartan-hydrochlorothiazide. Additionally, I am having her maintain her Calcium Carbonate-Vitamin D, multivitamin with minerals, aspirin EC, omeprazole, Vitamin D3, nebivolol, and meloxicam.  Meds ordered this encounter  Medications  . atorvastatin (LIPITOR) 40 MG tablet    Sig: Take 1 tablet (40 mg total) by mouth daily.    Dispense:  90 tablet    Refill:  1  . olmesartan-hydrochlorothiazide (BENICAR HCT) 20-12.5 MG tablet    Sig: Take 1 tablet by mouth daily.    Dispense:  90 tablet    Refill:  1     Follow-up: Return in about 6 months (around 04/16/2019).  Scarlette Calico, MD

## 2018-10-18 ENCOUNTER — Telehealth: Payer: Self-pay

## 2018-10-18 ENCOUNTER — Ambulatory Visit: Payer: Self-pay

## 2018-10-18 LAB — TB SKIN TEST
Induration: 0 mm
TB Skin Test: NEGATIVE

## 2018-10-18 NOTE — Telephone Encounter (Signed)
PPD skin test read, normal reading

## 2018-10-25 ENCOUNTER — Encounter: Payer: Self-pay | Admitting: Internal Medicine

## 2018-10-30 ENCOUNTER — Encounter: Payer: 59 | Admitting: Internal Medicine

## 2018-12-06 ENCOUNTER — Other Ambulatory Visit: Payer: Self-pay | Admitting: Internal Medicine

## 2018-12-06 DIAGNOSIS — I10 Essential (primary) hypertension: Secondary | ICD-10-CM

## 2018-12-06 DIAGNOSIS — E118 Type 2 diabetes mellitus with unspecified complications: Secondary | ICD-10-CM

## 2019-01-06 ENCOUNTER — Other Ambulatory Visit: Payer: Self-pay | Admitting: Internal Medicine

## 2019-01-06 DIAGNOSIS — M1610 Unilateral primary osteoarthritis, unspecified hip: Secondary | ICD-10-CM

## 2019-07-21 ENCOUNTER — Other Ambulatory Visit: Payer: Self-pay

## 2019-07-21 ENCOUNTER — Encounter: Payer: Self-pay | Admitting: Internal Medicine

## 2019-07-21 ENCOUNTER — Ambulatory Visit (INDEPENDENT_AMBULATORY_CARE_PROVIDER_SITE_OTHER): Payer: 59 | Admitting: Internal Medicine

## 2019-07-21 VITALS — BP 154/90 | HR 70 | Temp 98.2°F | Ht 67.0 in | Wt 220.0 lb

## 2019-07-21 DIAGNOSIS — I10 Essential (primary) hypertension: Secondary | ICD-10-CM

## 2019-07-21 DIAGNOSIS — E118 Type 2 diabetes mellitus with unspecified complications: Secondary | ICD-10-CM | POA: Diagnosis not present

## 2019-07-21 DIAGNOSIS — E785 Hyperlipidemia, unspecified: Secondary | ICD-10-CM

## 2019-07-21 DIAGNOSIS — E559 Vitamin D deficiency, unspecified: Secondary | ICD-10-CM

## 2019-07-21 MED ORDER — ATORVASTATIN CALCIUM 40 MG PO TABS: 40.0000 mg | ORAL_TABLET | Freq: Every day | ORAL | 1 refills | Status: DC

## 2019-07-21 MED ORDER — NEBIVOLOL HCL 5 MG PO TABS: 5.0000 mg | ORAL_TABLET | Freq: Every day | ORAL | 0 refills | Status: DC

## 2019-07-21 MED ORDER — OLMESARTAN MEDOXOMIL-HCTZ 20-12.5 MG PO TABS: ORAL_TABLET | ORAL | 1 refills | Status: DC

## 2019-07-21 NOTE — Patient Instructions (Signed)

## 2019-07-21 NOTE — Progress Notes (Signed)
Subjective:  Patient ID: [name], female    DOB: 08-Feb-1955  Age: 64 y.o. MRN: [id number]  CC: Hypertension and Diabetes  This visit occurred during the SARS-CoV-2 public health emergency.  Safety protocols were in place, including screening questions prior to the visit, additional usage of staff PPE, and extensive cleaning of exam room while observing appropriate contact time as indicated for disinfecting solutions.    HPI [name] presents for f/up - She recently ran out of her antihypertensives and is concerned that her blood pressure is not well controlled.  She has had a few headaches and swelling in her hands.  She denies chest pain, shortness of breath, blurred vision, dizziness, lightheadedness, lower extremity edema, palpitations, or fatigue.  Outpatient Medications Prior to Visit  Medication Sig Dispense Refill  . aspirin EC 325 MG tablet Take 1 tablet (325 mg total) by mouth daily. For 30 days post op for DVT Prophylaxis 30 tablet 0  . Calcium Carbonate-Vitamin D (CALTRATE 600+D) 600-400 MG-UNIT per tablet Take 1 tablet by mouth daily.     . Multiple Vitamins-Minerals (MULTIVITAMIN WITH MINERALS) tablet Take 1 tablet by mouth daily.    Marland Kitchen omeprazole (PRILOSEC) 20 MG capsule Take 1 capsule (20 mg total) by mouth daily. 30 days for gastroprotection while taking Aspirin. 30 capsule 0  . Cholecalciferol (VITAMIN D3) 1.25 MG (50000 UT) CAPS TAKE ONE CAPSULE BY MOUTH ONE TIME PER WEEK (Patient taking differently: Take 50,000 Units by mouth every 7 (seven) days. ) 12 capsule 0  . olmesartan-hydrochlorothiazide (BENICAR HCT) 20-12.5 MG tablet TAKE 1 TABLET BY MOUTH DAILY. FOLLOW-UP APPT DUE IN JULY MUST SEE PROVIDER FOR FUTURE REFILLS 90 tablet 1  . atorvastatin (LIPITOR) 40 MG tablet Take 1 tablet (40 mg total) by mouth daily. 90 tablet 1  . BYSTOLIC 5 MG tablet TAKE 1 TABLET BY MOUTH EVERY DAY (Patient not taking: Reported on 07/21/2019) 90 tablet 1  . meloxicam (MOBIC) 15 MG  tablet TAKE 1 TABLET BY MOUTH EVERY DAY 90 tablet 0   No facility-administered medications prior to visit.    ROS Review of Systems  Constitutional: Negative for appetite change, diaphoresis, fatigue and unexpected weight change.  HENT: Negative.   Eyes: Negative for visual disturbance.  Respiratory: Negative for cough, chest tightness, shortness of breath and wheezing.   Cardiovascular: Negative for chest pain, palpitations and leg swelling.  Gastrointestinal: Negative for abdominal pain, constipation, diarrhea, nausea and vomiting.  Endocrine: Negative.   Genitourinary: Negative.  Negative for difficulty urinating.  Musculoskeletal: Negative.  Negative for arthralgias and myalgias.  Skin: Negative.   Neurological: Positive for headaches. Negative for dizziness, speech difficulty, weakness and light-headedness.  Hematological: Negative for adenopathy. Does not bruise/bleed easily.  Psychiatric/Behavioral: Negative.     Objective:  BP (!) 154/90 (BP Location: Left Arm, Patient Position: Sitting, Cuff Size: Large)   Pulse 70   Temp 98.2 F (36.8 C) (Oral)   Ht 5\' 7"  (1.702 m)   Wt 220 lb (99.8 kg)   LMP 07/18/2012   SpO2 98%   BMI 34.46 kg/m   BP Readings from Last 3 Encounters:  07/21/19 (!) 154/90  10/16/18 (!) 146/86  04/08/18 (!) 152/84    Wt Readings from Last 3 Encounters:  07/21/19 220 lb (99.8 kg)  10/16/18 218 lb 2 oz (98.9 kg)  04/08/18 220 lb (99.8 kg)    Physical Exam Vitals reviewed.  Constitutional:      Appearance: Normal appearance.  HENT:  Nose: Nose normal.     Mouth/Throat:     Mouth: Mucous membranes are moist.  Eyes:     General: No scleral icterus.    Conjunctiva/sclera: Conjunctivae normal.  Cardiovascular:     Rate and Rhythm: Normal rate and regular rhythm.     Heart sounds: No murmur heard.   Pulmonary:     Breath sounds: No stridor. No wheezing, rhonchi or rales.  Abdominal:     General: Abdomen is flat.     Palpations:  There is no mass.     Tenderness: There is no abdominal tenderness. There is no guarding.  Musculoskeletal:        General: Normal range of motion.     Cervical back: Neck supple.     Right lower leg: No edema.     Left lower leg: No edema.  Lymphadenopathy:     Cervical: No cervical adenopathy.  Skin:    General: Skin is warm and dry.     Coloration: Skin is not pale.  Neurological:     General: No focal deficit present.     Mental Status: She is alert and oriented to person, place, and time. Mental status is at baseline.  Psychiatric:        Mood and Affect: Mood normal.        Behavior: Behavior normal.     Lab Results  Component Value Date   WBC 7.2 07/21/2019   HGB 11.8 07/21/2019   HCT 37.2 07/21/2019   PLT 193 07/21/2019   GLUCOSE 83 07/21/2019   CHOL 254 (H) 10/16/2018   TRIG 170.0 (H) 10/16/2018   HDL 43.40 10/16/2018   LDLDIRECT 167.9 11/09/2010   LDLCALC 177 (H) 10/16/2018   ALT 12 10/16/2018   AST 13 10/16/2018   NA 139 07/21/2019   K 3.5 07/21/2019   CL 106 07/21/2019   CREATININE 0.73 07/21/2019   BUN 16 07/21/2019   CO2 24 07/21/2019   TSH 1.20 10/16/2018   HGBA1C 6.0 (H) 07/21/2019   MICROALBUR 1.0 10/16/2018    MR Brain Wo Contrast  Result Date: 04/12/2018 CLINICAL DATA:  Frontal headaches for 1 month. EXAM: MRI HEAD WITHOUT CONTRAST TECHNIQUE: Multiplanar, multiecho pulse sequences of the brain and surrounding structures were obtained without intravenous contrast. COMPARISON:  Head CT 10/09/2007 FINDINGS: Brain: There is no evidence of acute infarct, intracranial hemorrhage, mass, midline shift, or extra-axial fluid collection. The ventricles and sulci are normal. Small T2 hyperintensities throughout the cerebral white matter bilaterally are nonspecific but compatible with mild-to-moderate chronic small vessel ischemic disease given the patient's vascular risk factors. Vascular: Major intracranial vascular flow voids are preserved. Skull and upper  cervical spine: Unremarkable bone marrow signal. Sinuses/Orbits: Unremarkable orbits. Paranasal sinuses and mastoid air cells are clear. Other: None. IMPRESSION: 1. No acute intracranial abnormality. 2. Mild-to-moderate chronic small vessel ischemic disease. Electronically Signed   By: Logan Bores M.D.   On: 04/12/2018 09:51    Assessment & Plan:   Lavera was seen today for hypertension and diabetes.  Diagnoses and all orders for this visit:  Essential hypertension- Her blood pressure is not adequately well controlled and she is symptomatic.  I have asked her to be compliant with all 3 antihypertensives.  Her labs are negative for secondary causes or endorgan damage. -     CBC with Differential/Platelet; Future -     BASIC METABOLIC PANEL WITH GFR; Future -     nebivolol (BYSTOLIC) 5 MG tablet; Take 1 tablet (5  mg total) by mouth daily. -     olmesartan-hydrochlorothiazide (BENICAR HCT) 20-12.5 MG tablet; TAKE 1 TABLET BY MOUTH DAILY. -     BASIC METABOLIC PANEL WITH GFR -     CBC with Differential/Platelet  Type II diabetes mellitus with manifestations ([place])- Her A1c is at 6.0%.  Her blood sugars are very well controlled.  Medical therapy is not indicated. -     Hemoglobin A1c; Future -     BASIC METABOLIC PANEL WITH GFR; Future -     olmesartan-hydrochlorothiazide (BENICAR HCT) 20-12.5 MG tablet; TAKE 1 TABLET BY MOUTH DAILY. -     BASIC METABOLIC PANEL WITH GFR -     Hemoglobin A1c  Vitamin D deficiency disease -     VITAMIN D 25 Hydroxy (Vit-D Deficiency, Fractures); Future -     VITAMIN D 25 Hydroxy (Vit-D Deficiency, Fractures) -     Cholecalciferol 50 MCG (2000 UT) TABS; Take 1 tablet (2,000 Units total) by mouth daily.  Hyperlipidemia with target LDL less than 130 -     atorvastatin (LIPITOR) 40 MG tablet; Take 1 tablet (40 mg total) by mouth daily.  Type 2 diabetes mellitus with complication, without long-term current use of insulin (HCC) -     atorvastatin (LIPITOR) 40 MG  tablet; Take 1 tablet (40 mg total) by mouth daily.   I have discontinued Chela L. Eustice's Vitamin D3 and meloxicam. I have changed her Bystolic to nebivolol. I have also changed her olmesartan-hydrochlorothiazide. Additionally, I am having her start on Cholecalciferol. Lastly, I am having her maintain her Calcium Carbonate-Vitamin D, multivitamin with minerals, aspirin EC, omeprazole, and atorvastatin.  Meds ordered this encounter  Medications  . nebivolol (BYSTOLIC) 5 MG tablet    Sig: Take 1 tablet (5 mg total) by mouth daily.    Dispense:  84 tablet    Refill:  0  . olmesartan-hydrochlorothiazide (BENICAR HCT) 20-12.5 MG tablet    Sig: TAKE 1 TABLET BY MOUTH DAILY.    Dispense:  90 tablet    Refill:  1  . atorvastatin (LIPITOR) 40 MG tablet    Sig: Take 1 tablet (40 mg total) by mouth daily.    Dispense:  90 tablet    Refill:  1  . Cholecalciferol 50 MCG (2000 UT) TABS    Sig: Take 1 tablet (2,000 Units total) by mouth daily.    Dispense:  90 tablet    Refill:  1     Follow-up: Return in about 6 weeks (around 09/01/2019).  Scarlette Calico, MD

## 2019-07-22 LAB — CBC WITH DIFFERENTIAL/PLATELET
Absolute Monocytes: 439 cells/uL (ref 200–950)
Basophils Absolute: 29 cells/uL (ref 0–200)
Basophils Relative: 0.4 %
Eosinophils Absolute: 72 cells/uL (ref 15–500)
Eosinophils Relative: 1 %
HCT: 37.2 % (ref 35.0–45.0)
Hemoglobin: 11.8 g/dL (ref 11.7–15.5)
Lymphs Abs: 2477 cells/uL (ref 850–3900)
MCH: 25.7 pg — ABNORMAL LOW (ref 27.0–33.0)
MCHC: 31.7 g/dL — ABNORMAL LOW (ref 32.0–36.0)
MCV: 81 fL (ref 80.0–100.0)
MPV: 12.1 fL (ref 7.5–12.5)
Monocytes Relative: 6.1 %
Neutro Abs: 4183 cells/uL (ref 1500–7800)
Neutrophils Relative %: 58.1 %
Platelets: 193 10*3/uL (ref 140–400)
RBC: 4.59 10*6/uL (ref 3.80–5.10)
RDW: 14.3 % (ref 11.0–15.0)
Total Lymphocyte: 34.4 %
WBC: 7.2 10*3/uL (ref 3.8–10.8)

## 2019-07-22 LAB — BASIC METABOLIC PANEL WITH GFR
BUN: 16 mg/dL (ref 7–25)
CO2: 24 mmol/L (ref 20–32)
Calcium: 9.7 mg/dL (ref 8.6–10.4)
Chloride: 106 mmol/L (ref 98–110)
Creat: 0.73 mg/dL (ref 0.50–0.99)
GFR, Est African American: 102 mL/min/{1.73_m2} (ref >=60)
GFR, Est Non African American: 88 mL/min/{1.73_m2} (ref >=60)
Glucose, Bld: 83 mg/dL (ref 65–99)
Potassium: 3.5 mmol/L (ref 3.5–5.3)
Sodium: 139 mmol/L (ref 135–146)

## 2019-07-22 LAB — HEMOGLOBIN A1C
Hgb A1c MFr Bld: 6 % of total Hgb — ABNORMAL HIGH (ref <5.7)
Mean Plasma Glucose: 126 (calc)
eAG (mmol/L): 7 (calc)

## 2019-07-22 LAB — VITAMIN D 25 HYDROXY (VIT D DEFICIENCY, FRACTURES): Vit D, 25-Hydroxy: 21 ng/mL — ABNORMAL LOW (ref 30–100)

## 2019-07-22 MED ORDER — CHOLECALCIFEROL 50 MCG (2000 UT) PO TABS: 1.0000 | ORAL_TABLET | Freq: Every day | ORAL | 1 refills | Status: DC

## 2020-01-18 ENCOUNTER — Other Ambulatory Visit: Payer: Self-pay | Admitting: Internal Medicine

## 2020-01-18 DIAGNOSIS — E559 Vitamin D deficiency, unspecified: Secondary | ICD-10-CM

## 2020-01-18 DIAGNOSIS — E785 Hyperlipidemia, unspecified: Secondary | ICD-10-CM

## 2020-01-18 DIAGNOSIS — E118 Type 2 diabetes mellitus with unspecified complications: Secondary | ICD-10-CM

## 2020-06-08 ENCOUNTER — Encounter: Payer: Self-pay | Admitting: Internal Medicine

## 2020-06-08 ENCOUNTER — Other Ambulatory Visit: Payer: Self-pay

## 2020-06-08 ENCOUNTER — Ambulatory Visit (INDEPENDENT_AMBULATORY_CARE_PROVIDER_SITE_OTHER): Payer: Managed Care, Other (non HMO) | Admitting: Internal Medicine

## 2020-06-08 VITALS — BP 132/78 | HR 67 | Temp 98.6°F | Resp 16 | Ht 67.0 in | Wt 210.0 lb

## 2020-06-08 DIAGNOSIS — E785 Hyperlipidemia, unspecified: Secondary | ICD-10-CM

## 2020-06-08 DIAGNOSIS — Z0000 Encounter for general adult medical examination without abnormal findings: Secondary | ICD-10-CM

## 2020-06-08 DIAGNOSIS — B354 Tinea corporis: Secondary | ICD-10-CM | POA: Insufficient documentation

## 2020-06-08 DIAGNOSIS — Z23 Encounter for immunization: Secondary | ICD-10-CM | POA: Diagnosis not present

## 2020-06-08 DIAGNOSIS — R002 Palpitations: Secondary | ICD-10-CM | POA: Insufficient documentation

## 2020-06-08 DIAGNOSIS — Z1231 Encounter for screening mammogram for malignant neoplasm of breast: Secondary | ICD-10-CM

## 2020-06-08 DIAGNOSIS — E118 Type 2 diabetes mellitus with unspecified complications: Secondary | ICD-10-CM

## 2020-06-08 DIAGNOSIS — Z124 Encounter for screening for malignant neoplasm of cervix: Secondary | ICD-10-CM | POA: Insufficient documentation

## 2020-06-08 DIAGNOSIS — E559 Vitamin D deficiency, unspecified: Secondary | ICD-10-CM

## 2020-06-08 DIAGNOSIS — I491 Atrial premature depolarization: Secondary | ICD-10-CM

## 2020-06-08 DIAGNOSIS — I10 Essential (primary) hypertension: Secondary | ICD-10-CM | POA: Diagnosis not present

## 2020-06-08 LAB — CBC WITH DIFFERENTIAL/PLATELET
Basophils Absolute: 0 10*3/uL (ref 0.0–0.1)
Basophils Relative: 0.4 % (ref 0.0–3.0)
Eosinophils Absolute: 0.1 10*3/uL (ref 0.0–0.7)
Eosinophils Relative: 1.7 % (ref 0.0–5.0)
HCT: 37 % (ref 36.0–46.0)
Hemoglobin: 12.2 g/dL (ref 12.0–15.0)
Lymphocytes Relative: 33.9 % (ref 12.0–46.0)
Lymphs Abs: 2.3 10*3/uL (ref 0.7–4.0)
MCHC: 33 g/dL (ref 30.0–36.0)
MCV: 80.3 fl (ref 78.0–100.0)
Monocytes Absolute: 0.5 10*3/uL (ref 0.1–1.0)
Monocytes Relative: 6.9 % (ref 3.0–12.0)
Neutro Abs: 3.8 10*3/uL (ref 1.4–7.7)
Neutrophils Relative %: 57.1 % (ref 43.0–77.0)
Platelets: 164 10*3/uL (ref 150.0–400.0)
RBC: 4.61 Mil/uL (ref 3.87–5.11)
RDW: 15.7 % — ABNORMAL HIGH (ref 11.5–15.5)
WBC: 6.7 10*3/uL (ref 4.0–10.5)

## 2020-06-08 LAB — BASIC METABOLIC PANEL
BUN: 16 mg/dL (ref 6–23)
CO2: 24 mEq/L (ref 19–32)
Calcium: 9.6 mg/dL (ref 8.4–10.5)
Chloride: 106 mEq/L (ref 96–112)
Creatinine, Ser: 0.89 mg/dL (ref 0.40–1.20)
GFR: 69.11 mL/min (ref >60.00)
Glucose, Bld: 96 mg/dL (ref 70–99)
Potassium: 3.8 mEq/L (ref 3.5–5.1)
Sodium: 140 mEq/L (ref 135–145)

## 2020-06-08 LAB — HEPATIC FUNCTION PANEL
ALT: 16 U/L (ref 0–35)
AST: 18 U/L (ref 0–37)
Albumin: 4.1 g/dL (ref 3.5–5.2)
Alkaline Phosphatase: 60 U/L (ref 39–117)
Bilirubin, Direct: 0.1 mg/dL (ref 0.0–0.3)
Total Bilirubin: 0.4 mg/dL (ref 0.2–1.2)
Total Protein: 7.5 g/dL (ref 6.0–8.3)

## 2020-06-08 LAB — URINALYSIS, ROUTINE W REFLEX MICROSCOPIC
Bilirubin Urine: NEGATIVE
Hgb urine dipstick: NEGATIVE
Ketones, ur: NEGATIVE
Nitrite: NEGATIVE
RBC / HPF: NONE SEEN (ref 0–?)
Specific Gravity, Urine: 1.025 (ref 1.000–1.030)
Total Protein, Urine: NEGATIVE
Urine Glucose: NEGATIVE
Urobilinogen, UA: 0.2 (ref 0.0–1.0)
pH: 5.5 (ref 5.0–8.0)

## 2020-06-08 LAB — VITAMIN D 25 HYDROXY (VIT D DEFICIENCY, FRACTURES): VITD: 31.04 ng/mL (ref 30.00–100.00)

## 2020-06-08 LAB — LIPID PANEL
Cholesterol: 162 mg/dL (ref 0–200)
HDL: 45.7 mg/dL (ref >39.00)
LDL Cholesterol: 99 mg/dL (ref 0–99)
NonHDL: 116.22
Total CHOL/HDL Ratio: 4
Triglycerides: 85 mg/dL (ref 0.0–149.0)
VLDL: 17 mg/dL (ref 0.0–40.0)

## 2020-06-08 LAB — MICROALBUMIN / CREATININE URINE RATIO
Creatinine,U: 149.9 mg/dL
Microalb Creat Ratio: 0.5 mg/g (ref 0.0–30.0)
Microalb, Ur: 0.8 mg/dL (ref 0.0–1.9)

## 2020-06-08 LAB — HEMOGLOBIN A1C: Hgb A1c MFr Bld: 6.5 % (ref 4.6–6.5)

## 2020-06-08 MED ORDER — CICLOPIROX 0.77 % EX GEL: 1.0000 | Freq: Two times a day (BID) | CUTANEOUS | 2 refills | Status: DC

## 2020-06-08 NOTE — Progress Notes (Signed)
Subjective:  Patient ID: [name], female    DOB: 06-29-1955  Age: 65 y.o. MRN: [id number]  CC: Annual Exam, Hypertension, Diabetes, Hyperlipidemia, and Rash  This visit occurred during the SARS-CoV-2 public health emergency.  Safety protocols were in place, including screening questions prior to the visit, additional usage of staff PPE, and extensive cleaning of exam room while observing appropriate contact time as indicated for disinfecting solutions.    HPI [name] presents for a CPX and f/up -   She complains of a several week history of palpitations that she describes as intermittent extra beats.  She exercises on elliptical and does not experience CP, DOE, diaphoresis, dizziness, lightheadedness, syncope, or near syncope.  She does not monitor her blood sugars but feels like they are well controlled.  She also complains of a several week history of rash under both breasts.  She tells me the area feels irritated and red.  Outpatient Medications Prior to Visit  Medication Sig Dispense Refill   aspirin EC 325 MG tablet Take 1 tablet (325 mg total) by mouth daily. For 30 days post op for DVT Prophylaxis 30 tablet 0   Cholecalciferol (VITAMIN D3) 50 MCG (2000 UT) TABS TAKE 1 TABLET BY MOUTH EVERY DAY 90 tablet 1   Multiple Vitamins-Minerals (MULTIVITAMIN WITH MINERALS) tablet Take 1 tablet by mouth daily.     atorvastatin (LIPITOR) 40 MG tablet TAKE 1 TABLET BY MOUTH EVERY DAY 90 tablet 1   Calcium Carbonate-Vitamin D 600-400 MG-UNIT tablet Take 1 tablet by mouth daily.     olmesartan-hydrochlorothiazide (BENICAR HCT) 20-12.5 MG tablet TAKE 1 TABLET BY MOUTH DAILY. 90 tablet 1   nebivolol (BYSTOLIC) 5 MG tablet Take 1 tablet (5 mg total) by mouth daily. (Patient not taking: Reported on 06/08/2020) 84 tablet 0   omeprazole (PRILOSEC) 20 MG capsule Take 1 capsule (20 mg total) by mouth daily. 30 days for gastroprotection while taking Aspirin. (Patient not taking: Reported on  06/08/2020) 30 capsule 0   No facility-administered medications prior to visit.    ROS Review of Systems  Constitutional:  Negative for appetite change, diaphoresis, fatigue and unexpected weight change.  HENT: Negative.    Eyes: Negative.   Respiratory:  Negative for cough, chest tightness, shortness of breath and wheezing.   Cardiovascular:  Positive for palpitations. Negative for chest pain and leg swelling.  Gastrointestinal:  Negative for abdominal pain, constipation, diarrhea, nausea and vomiting.  Endocrine: Negative.  Negative for polydipsia, polyphagia and polyuria.  Genitourinary: Negative.  Negative for difficulty urinating, dysuria, frequency and hematuria.  Musculoskeletal:  Positive for arthralgias. Negative for back pain and myalgias.  Skin: Negative.   Neurological: Negative.  Negative for dizziness, weakness, light-headedness and headaches.  Hematological:  Negative for adenopathy. Does not bruise/bleed easily.  Psychiatric/Behavioral: Negative.     Objective:  BP 132/78 (BP Location: Right Arm, Patient Position: Sitting, Cuff Size: Large)   Pulse 67   Temp 98.6 F (37 C) (Oral)   Resp 16   Ht 5\' 7"  (1.702 m)   Wt 210 lb (95.3 kg)   LMP 07/18/2012   SpO2 97%   BMI 32.89 kg/m   BP Readings from Last 3 Encounters:  06/08/20 132/78  07/21/19 (!) 154/90  10/16/18 (!) 146/86    Wt Readings from Last 3 Encounters:  06/08/20 210 lb (95.3 kg)  07/21/19 220 lb (99.8 kg)  10/16/18 218 lb 2 oz (98.9 kg)    Physical Exam Vitals reviewed.  Constitutional:  Appearance: Normal appearance.  HENT:     Nose: Nose normal.     Mouth/Throat:     Mouth: Mucous membranes are moist.  Eyes:     General: No scleral icterus.    Conjunctiva/sclera: Conjunctivae normal.  Cardiovascular:     Rate and Rhythm: Normal rate. Occasional Extrasystoles are present.    Heart sounds: No murmur heard.   No gallop.     Comments: EKG- Sinus rhythm with PAC's Otherwise normal  EKG Pulmonary:     Effort: Pulmonary effort is normal.     Breath sounds: No stridor. No wheezing, rhonchi or rales.  Chest:       Comments: Under both breasts, symmetrically, there is confluent erythema and moist skin. Abdominal:     General: Abdomen is protuberant. Bowel sounds are normal. There is no distension.     Palpations: Abdomen is soft. There is no fluid wave, hepatomegaly, splenomegaly or mass.     Tenderness: There is no abdominal tenderness. There is no guarding.     Hernia: No hernia is present.  Musculoskeletal:        General: Normal range of motion.     Cervical back: Normal range of motion and neck supple.  Skin:    General: Skin is warm and dry.     Coloration: Skin is not pale.     Findings: Erythema and rash present.  Neurological:     General: No focal deficit present.     Mental Status: She is alert and oriented to person, place, and time. Mental status is at baseline.  Psychiatric:        Mood and Affect: Mood normal.        Behavior: Behavior normal.    Lab Results  Component Value Date   WBC 6.7 06/08/2020   HGB 12.2 06/08/2020   HCT 37.0 06/08/2020   PLT 164.0 06/08/2020   GLUCOSE 96 06/08/2020   CHOL 162 06/08/2020   TRIG 85.0 06/08/2020   HDL 45.70 06/08/2020   LDLDIRECT 167.9 11/09/2010   LDLCALC 99 06/08/2020   ALT 16 06/08/2020   AST 18 06/08/2020   NA 140 06/08/2020   K 3.8 06/08/2020   CL 106 06/08/2020   CREATININE 0.89 06/08/2020   BUN 16 06/08/2020   CO2 24 06/08/2020   TSH 2.07 06/08/2020   HGBA1C 6.5 06/08/2020   MICROALBUR 0.8 06/08/2020    MR Brain Wo Contrast  Result Date: 04/12/2018 CLINICAL DATA:  Frontal headaches for 1 month. EXAM: MRI HEAD WITHOUT CONTRAST TECHNIQUE: Multiplanar, multiecho pulse sequences of the brain and surrounding structures were obtained without intravenous contrast. COMPARISON:  Head CT 10/09/2007 FINDINGS: Brain: There is no evidence of acute infarct, intracranial hemorrhage, mass, midline  shift, or extra-axial fluid collection. The ventricles and sulci are normal. Small T2 hyperintensities throughout the cerebral white matter bilaterally are nonspecific but compatible with mild-to-moderate chronic small vessel ischemic disease given the patient's vascular risk factors. Vascular: Major intracranial vascular flow voids are preserved. Skull and upper cervical spine: Unremarkable bone marrow signal. Sinuses/Orbits: Unremarkable orbits. Paranasal sinuses and mastoid air cells are clear. Other: None. IMPRESSION: 1. No acute intracranial abnormality. 2. Mild-to-moderate chronic small vessel ischemic disease. Electronically Signed   By: Logan Bores M.D.   On: 04/12/2018 09:51    Assessment & Plan:   [name] was seen today for annual exam, hypertension, diabetes, hyperlipidemia and rash.  Diagnoses and all orders for this visit:  Type II diabetes mellitus with manifestations ([place])- Her A1c  is at 6.5%.  Her blood sugar is adequately well controlled with lifestyle modifications. -     Basic metabolic panel; Future -     Microalbumin / creatinine urine ratio; Future -     Urinalysis, Routine w reflex microscopic; Future -     Hemoglobin A1c; Future -     Ambulatory referral to Ophthalmology -     HM Diabetes Foot Exam -     Hemoglobin A1c -     Urinalysis, Routine w reflex microscopic -     Microalbumin / creatinine urine ratio -     Basic metabolic panel  Essential hypertension- Her blood pressure is adequately well controlled. -     CBC with Differential/Platelet; Future -     Basic metabolic panel; Future -     Urinalysis, Routine w reflex microscopic; Future -     Thyroid Panel With TSH; Future -     Thyroid Panel With TSH -     Urinalysis, Routine w reflex microscopic -     Basic metabolic panel -     CBC with Differential/Platelet -     EKG 12-Lead  Hyperlipidemia with target LDL less than 130- LDL goal achieved. Doing well on the statin  -     Lipid panel; Future -      Cancel: TSH; Future -     Hepatic function panel; Future -     Hepatic function panel -     Lipid panel -     atorvastatin (LIPITOR) 40 MG tablet; Take 1 tablet (40 mg total) by mouth daily.  Routine general medical examination at a health care facility- Exam completed, labs reviewed, vaccines reviewed and updated, cancer screenings addressed, patient education was given.  Visit for screening mammogram -     MM DIGITAL SCREENING BILATERAL; Future  Vitamin D deficiency disease- Her vitamin D level is normal now. -     VITAMIN D 25 Hydroxy (Vit-D Deficiency, Fractures); Future -     VITAMIN D 25 Hydroxy (Vit-D Deficiency, Fractures)  Intermittent palpitations- See below. -     Thyroid Panel With TSH; Future -     Thyroid Panel With TSH  Cervical cancer screening -     Ambulatory referral to Gynecology  Premature atrial complexes- Her labs are negative for secondary causes.  She is symptomatic with this so I recommended that she see cardiology to consider antiarrhythmic therapy. -     Thyroid Panel With TSH; Future -     Thyroid Panel With TSH -     Ambulatory referral to Cardiology  Need for vaccination -     Pneumococcal conjugate vaccine 20-valent (Prevnar 20)  Tinea corporis -     Ciclopirox 0.77 % gel; Apply 1 Act topically 2 (two) times daily.  Type 2 diabetes mellitus with complication, without long-term current use of insulin (HCC) -     atorvastatin (LIPITOR) 40 MG tablet; Take 1 tablet (40 mg total) by mouth daily.  I have discontinued [name]'s Calcium Carbonate-Vitamin D, omeprazole, nebivolol, and olmesartan-hydrochlorothiazide. I have also changed her atorvastatin. Additionally, I am having her start on Ciclopirox. Lastly, I am having her maintain her multivitamin with minerals, aspirin EC, and Vitamin D3.  Meds ordered this encounter  Medications   Ciclopirox 0.77 % gel    Sig: Apply 1 Act topically 2 (two) times daily.    Dispense:  100 g    Refill:  2    atorvastatin (LIPITOR) 40 MG  tablet    Sig: Take 1 tablet (40 mg total) by mouth daily.    Dispense:  90 tablet    Refill:  1     Follow-up: Return in about 6 months (around 12/08/2020).  Patricia Calico, MD

## 2020-06-08 NOTE — Patient Instructions (Signed)
Health Maintenance, Female Adopting a healthy lifestyle and getting preventive care are important in promoting health and wellness. Ask your health care provider about:  The right schedule for you to have regular tests and exams.  Things you can do on your own to prevent diseases and keep yourself healthy. What should I know about diet, weight, and exercise? Eat a healthy diet  Eat a diet that includes plenty of vegetables, fruits, low-fat dairy products, and lean protein.  Do not eat a lot of foods that are high in solid fats, added sugars, or sodium.   Maintain a healthy weight Body mass index (BMI) is used to identify weight problems. It estimates body fat based on height and weight. Your health care provider can help determine your BMI and help you achieve or maintain a healthy weight. Get regular exercise Get regular exercise. This is one of the most important things you can do for your health. Most adults should:  Exercise for at least 150 minutes each week. The exercise should increase your heart rate and make you sweat (moderate-intensity exercise).  Do strengthening exercises at least twice a week. This is in addition to the moderate-intensity exercise.  Spend less time sitting. Even light physical activity can be beneficial. Watch cholesterol and blood lipids Have your blood tested for lipids and cholesterol at 63 years of age, then have this test every 5 years. Have your cholesterol levels checked more often if:  Your lipid or cholesterol levels are high.  You are older than 63 years of age.  You are at high risk for heart disease. What should I know about cancer screening? Depending on your health history and family history, you may need to have cancer screening at various ages. This may include screening for:  Breast cancer.  Cervical cancer.  Colorectal cancer.  Skin cancer.  Lung cancer. What should I know about heart disease, diabetes, and high blood  pressure? Blood pressure and heart disease  High blood pressure causes heart disease and increases the risk of stroke. This is more likely to develop in people who have high blood pressure readings, are of African descent, or are overweight.  Have your blood pressure checked: ? Every 3-5 years if you are 18-39 years of age. ? Every year if you are 40 years old or older. Diabetes Have regular diabetes screenings. This checks your fasting blood sugar level. Have the screening done:  Once every three years after age 40 if you are at a normal weight and have a low risk for diabetes.  More often and at a younger age if you are overweight or have a high risk for diabetes. What should I know about preventing infection? Hepatitis B If you have a higher risk for hepatitis B, you should be screened for this virus. Talk with your health care provider to find out if you are at risk for hepatitis B infection. Hepatitis C Testing is recommended for:  Everyone born from 1945 through 1965.  Anyone with known risk factors for hepatitis C. Sexually transmitted infections (STIs)  Get screened for STIs, including gonorrhea and chlamydia, if: ? You are sexually active and are younger than 63 years of age. ? You are older than 63 years of age and your health care provider tells you that you are at risk for this type of infection. ? Your sexual activity has changed since you were last screened, and you are at increased risk for chlamydia or gonorrhea. Ask your health care provider   if you are at risk.  Ask your health care provider about whether you are at high risk for HIV. Your health care provider may recommend a prescription medicine to help prevent HIV infection. If you choose to take medicine to prevent HIV, you should first get tested for HIV. You should then be tested every 3 months for as long as you are taking the medicine. Pregnancy  If you are about to stop having your period (premenopausal) and  you may become pregnant, seek counseling before you get pregnant.  Take 400 to 800 micrograms (mcg) of folic acid every day if you become pregnant.  Ask for birth control (contraception) if you want to prevent pregnancy. Osteoporosis and menopause Osteoporosis is a disease in which the bones lose minerals and strength with aging. This can result in bone fractures. If you are 65 years old or older, or if you are at risk for osteoporosis and fractures, ask your health care provider if you should:  Be screened for bone loss.  Take a calcium or vitamin D supplement to lower your risk of fractures.  Be given hormone replacement therapy (HRT) to treat symptoms of menopause. Follow these instructions at home: Lifestyle  Do not use any products that contain nicotine or tobacco, such as cigarettes, e-cigarettes, and chewing tobacco. If you need help quitting, ask your health care provider.  Do not use street drugs.  Do not share needles.  Ask your health care provider for help if you need support or information about quitting drugs. Alcohol use  Do not drink alcohol if: ? Your health care provider tells you not to drink. ? You are pregnant, may be pregnant, or are planning to become pregnant.  If you drink alcohol: ? Limit how much you use to 0-1 drink a day. ? Limit intake if you are breastfeeding.  Be aware of how much alcohol is in your drink. In the [place] one drink equals one 12 oz bottle of beer (355 mL), one 5 oz glass of wine (148 mL), or one 1 oz glass of hard liquor (44 mL). General instructions  Schedule regular health, dental, and eye exams.  Stay current with your vaccines.  Tell your health care provider if: ? You often feel depressed. ? You have ever been abused or do not feel safe at home. Summary  Adopting a healthy lifestyle and getting preventive care are important in promoting health and wellness.  Follow your health care provider's instructions about healthy  diet, exercising, and getting tested or screened for diseases.  Follow your health care provider's instructions on monitoring your cholesterol and blood pressure. This information is not intended to replace advice given to you by your health care provider. Make sure you discuss any questions you have with your health care provider. Document Revised: 12/12/2017 Document Reviewed: 12/12/2017 Elsevier Patient Education  2021 Elsevier Inc.  

## 2020-06-09 LAB — THYROID PANEL WITH TSH
Free Thyroxine Index: 2.2 (ref 1.4–3.8)
T3 Uptake: 31 % (ref 22–35)
T4, Total: 7.1 ug/dL (ref 5.1–11.9)
TSH: 2.07 mIU/L (ref 0.40–4.50)

## 2020-06-09 MED ORDER — ATORVASTATIN CALCIUM 40 MG PO TABS
1.0000 | ORAL_TABLET | Freq: Every day | ORAL | 1 refills | Status: DC
Start: 2020-06-09 — End: 2021-07-13

## 2020-08-24 ENCOUNTER — Ambulatory Visit
Admission: RE | Admit: 2020-08-24 | Discharge: 2020-08-24 | Disposition: A | Discharge status: Home / Self Care | Payer: Managed Care, Other (non HMO) | Source: Ambulatory Visit | Attending: Internal Medicine | Admitting: Internal Medicine

## 2020-08-24 ENCOUNTER — Other Ambulatory Visit: Payer: Self-pay

## 2020-08-24 DIAGNOSIS — Z1231 Encounter for screening mammogram for malignant neoplasm of breast: Secondary | ICD-10-CM

## 2020-09-01 ENCOUNTER — Encounter: Payer: Self-pay | Admitting: Cardiovascular Disease

## 2020-09-01 ENCOUNTER — Ambulatory Visit: Payer: Managed Care, Other (non HMO) | Admitting: Cardiovascular Disease

## 2020-09-01 ENCOUNTER — Other Ambulatory Visit: Payer: Self-pay

## 2020-09-01 VITALS — BP 128/72 | HR 76 | Ht 67.0 in | Wt 209.8 lb

## 2020-09-01 DIAGNOSIS — I10 Essential (primary) hypertension: Secondary | ICD-10-CM

## 2020-09-01 DIAGNOSIS — E785 Hyperlipidemia, unspecified: Secondary | ICD-10-CM

## 2020-09-01 DIAGNOSIS — R002 Palpitations: Secondary | ICD-10-CM

## 2020-09-01 NOTE — Assessment & Plan Note (Signed)
Ms. [name] was referred by Dr. Ronnald Ramp for palpitations.  She gets these on a monthly basis and they are self-limited.  She has discontinued caffeine intake.  TSH is normal.  I am going get a 2D echocardiogram and a 30-day event monitor to further evaluate.

## 2020-09-01 NOTE — Patient Instructions (Signed)
Medication Instructions:  The current medical regimen is effective;  continue present plan and medications.  *If you need a refill on your cardiac medications before your next appointment, please call your pharmacy*   Testing/Procedures: Echocardiogram - Your physician has requested that you have an echocardiogram. Echocardiography is a painless test that uses sound waves to create images of your heart. It provides your doctor with information about the size and shape of your heart and how well your heart's chambers and valves are working. This procedure takes approximately one hour. There are no restrictions for this procedure. This will be performed at our [facility] location - [address], [address].  Your physician has recommended that you wear an event monitor 30 day. Event monitors are medical devices that record the heart's electrical activity. Doctors most often [place] these monitors to diagnose arrhythmias. Arrhythmias are problems with the speed or rhythm of the heartbeat. The monitor is a small, portable device. You can wear one while you do your normal daily activities. This is usually used to diagnose what is causing palpitations/syncope (passing out). This monitor will be mailed to you, someone will call and discuss this before mailing.    Follow-Up: At [facility], you and your health needs are our priority.  As part of our continuing mission to provide you with exceptional heart care, we have created designated Provider Care Teams.  These Care Teams include your primary Cardiologist (physician) and Advanced Practice Providers (APPs -  Physician Assistants and Nurse Practitioners) who all work together to provide you with the care you need, when you need it.  We recommend signing up for the patient portal called "MyChart".  Sign up information is provided on this After Visit Summary.  MyChart is used to connect with patients for Virtual Visits (Telemedicine).  Patients are able to  view lab/test results, encounter notes, upcoming appointments, etc.  Non-urgent messages can be sent to your provider as well.   To learn more about what you can do with MyChart, go to [email or web address].    Your next appointment:   As needed  The format for your next appointment:   In Person  Provider:   Quay Burow, MD

## 2020-09-01 NOTE — Assessment & Plan Note (Signed)
History of essential hypertension blood pressure measured today at 120/72.  She is on Benicar hydrochlorothiazide.

## 2020-09-01 NOTE — Assessment & Plan Note (Signed)
History of hyperlipidemia on statin therapy with lipid profile performed 06/08/2020 revealing a total cholesterol 162, LDL 99 and HDL 45.

## 2020-09-01 NOTE — Progress Notes (Signed)
09/01/2020 [name]   1955-10-24  [id number]  Primary Physician Janith Lima, MD Primary Cardiologist: Lorretta Harp MD Lupe Carney, [place]  HPI:  [name] is a 65 y.o. moderately overweight divorced African-American female mother of 3 children, grandmother of 3 grandchildren who works doing Sales executive.  She was referred by her PCP, Dr. Ronnald Ramp, for evaluation of intermittent palpitations.  Her cardiac risk factor profile is notable for treated hypertension and hyperlipidemia.  She does not smoke.  She drinks on occasion.  There is no family history for heart disease.  She is never had a heart extra.  She denies chest pain or shortness of breath.  She did discontinue caffeine from her diet in the past.  She notices palpitations that occur monthly basis lasting minutes at a time and are self-limited.   Current Meds  Medication Sig   aspirin EC 325 MG tablet Take 1 tablet (325 mg total) by mouth daily. For 30 days post op for DVT Prophylaxis   atorvastatin (LIPITOR) 40 MG tablet Take 1 tablet (40 mg total) by mouth daily.   Cholecalciferol (VITAMIN D3) 50 MCG (2000 UT) TABS TAKE 1 TABLET BY MOUTH EVERY DAY   Ciclopirox 0.77 % gel Apply 1 Act topically 2 (two) times daily.   Multiple Vitamins-Minerals (MULTIVITAMIN WITH MINERALS) tablet Take 1 tablet by mouth daily.   olmesartan-hydrochlorothiazide (BENICAR HCT) 20-12.5 MG tablet Take 1 tablet by mouth daily.     Allergies  Allergen Reactions   Amlodipine Other (See Comments)    Constipation     Social History   Socioeconomic History   Marital status: Divorced    Spouse name: Not on file   Number of children: Not on file   Years of education: Not on file   Highest education level: Not on file  Occupational History   Not on file  Tobacco Use   Smoking status: Never   Smokeless tobacco: Never  Vaping Use   Vaping Use: Never used  Substance and Sexual Activity   Alcohol use: No   Drug use: No    Sexual activity: Not Currently    Birth control/protection: Surgical  Other Topics Concern   Not on file  Social History Narrative   Divorced- 3 grown children   Laid off from work in July of 2009   Social Determinants of Radio broadcast assistant Strain: Not on file  Food Insecurity: Not on file  Transportation Needs: Not on file  Physical Activity: Not on file  Stress: Not on file  Social Connections: Not on file  Intimate Partner Violence: Not on file     Review of Systems: General: negative for chills, fever, night sweats or weight changes.  Cardiovascular: negative for chest pain, dyspnea on exertion, edema, orthopnea, palpitations, paroxysmal nocturnal dyspnea or shortness of breath Dermatological: negative for rash Respiratory: negative for cough or wheezing Urologic: negative for hematuria Abdominal: negative for nausea, vomiting, diarrhea, bright red blood per rectum, melena, or hematemesis Neurologic: negative for visual changes, syncope, or dizziness All other systems reviewed and are otherwise negative except as noted above.    Blood pressure 128/72, pulse 76, height '5\' 7"'$  (1.702 m), weight 209 lb 12.8 oz (95.2 kg), last menstrual period 07/18/2012, SpO2 98 %.  General appearance: alert and no distress Neck: no adenopathy, no carotid bruit, no JVD, supple, symmetrical, trachea midline, and thyroid not enlarged, symmetric, no tenderness/mass/nodules Lungs: clear to auscultation bilaterally Heart: regular rate  and rhythm, S1, S2 normal, no murmur, click, rub or gallop Extremities: extremities normal, atraumatic, no cyanosis or edema Pulses: 2+ and symmetric Skin: Skin color, texture, turgor normal. No rashes or lesions Neurologic: Grossly normal  EKG sinus rhythm at 76 with nonspecific ST and T wave changes.  Personally reviewed this EKG.  ASSESSMENT AND PLAN:   Hyperlipidemia with target LDL less than 130 History of hyperlipidemia on statin therapy with  lipid profile performed 06/08/2020 revealing a total cholesterol 162, LDL 99 and HDL 45.  Essential hypertension History of essential hypertension blood pressure measured today at 120/72.  She is on Benicar hydrochlorothiazide.  Palpitations [name] was referred by Dr. Ronnald Ramp for palpitations.  She gets these on a monthly basis and they are self-limited.  She has discontinued caffeine intake.  TSH is normal.  I am going get a 2D echocardiogram and a 30-day event monitor to further evaluate.     Lorretta Harp MD FACP,FACC,FAHA, [facility] 09/01/2020 10:07 AM

## 2020-09-13 ENCOUNTER — Other Ambulatory Visit: Payer: Self-pay

## 2020-09-13 ENCOUNTER — Encounter: Payer: Self-pay | Admitting: Nurse Practitioner

## 2020-09-13 ENCOUNTER — Other Ambulatory Visit (HOSPITAL_COMMUNITY)
Admission: RE | Admit: 2020-09-13 | Discharge: 2020-09-13 | Disposition: A | Discharge status: Home / Self Care | Payer: Managed Care, Other (non HMO) | Source: Ambulatory Visit | Attending: Nurse Practitioner | Admitting: Nurse Practitioner

## 2020-09-13 ENCOUNTER — Ambulatory Visit (INDEPENDENT_AMBULATORY_CARE_PROVIDER_SITE_OTHER): Payer: Managed Care, Other (non HMO) | Admitting: Nurse Practitioner

## 2020-09-13 VITALS — BP 126/84 | Ht 67.5 in | Wt 210.0 lb

## 2020-09-13 DIAGNOSIS — Z01419 Encounter for gynecological examination (general) (routine) without abnormal findings: Secondary | ICD-10-CM | POA: Insufficient documentation

## 2020-09-13 DIAGNOSIS — Z780 Asymptomatic menopausal state: Secondary | ICD-10-CM

## 2020-09-13 DIAGNOSIS — Z113 Encounter for screening for infections with a predominantly sexual mode of transmission: Secondary | ICD-10-CM | POA: Insufficient documentation

## 2020-09-13 NOTE — Progress Notes (Signed)
   [name] 1955/01/26 [id number]   History:  65 y.o. G3P3003 presents as new patient to establish care. No GYN complaints. Postmenopausal - no HRT, no bleeding. Normal pap and mammogram history. HTN, HLD, DM managed by PCP.   Gynecologic History Patient's last menstrual period was 07/18/2012.   Contraception/Family planning: post menopausal status Sexually active: Yes  Health Maintenance Last Pap: UNK (around 2010 per patient) Last mammogram: 08/24/2020. Results were: Normal Last colonoscopy: 08/08/2017. Results were: Normal, 10-year recall Last Dexa: Never  Past medical history, past surgical history, family history and social history were all reviewed and documented in the EPIC chart. Works for [employer]. 3 children, grand and great grandchildren.   ROS:  A ROS was performed and pertinent positives and negatives are included.  Exam:  Vitals:   09/13/20 0820  BP: 126/84  Weight: 210 lb (95.3 kg)  Height: 5' 7.5" (1.715 m)   Body mass index is 32.41 kg/m.  General appearance:  Normal Thyroid:  Symmetrical, normal in size, without palpable masses or nodularity. Respiratory  Auscultation:  Clear without wheezing or rhonchi Cardiovascular  Auscultation:  Regular rate, without rubs, murmurs or gallops  Edema/varicosities:  Not grossly evident Abdominal  Soft,nontender, without masses, guarding or rebound.  Liver/spleen:  No organomegaly noted  Hernia:  None appreciated  Skin  Inspection:  Grossly normal Breasts: Examined lying and sitting.   Right: Without masses, retractions, nipple discharge or axillary adenopathy.   Left: Without masses, retractions, nipple discharge or axillary adenopathy. Genitourinary   Inguinal/mons:  Normal without inguinal adenopathy  External genitalia:  Normal appearing vulva with no masses, tenderness, or lesions  BUS/Urethra/Skene's glands:  Normal  Vagina:  Normal appearing with normal color and discharge, no  lesions  Cervix:  Normal appearing without discharge or lesions  Uterus:  Difficult to palpate due to body habitus but no gross masses or tenderness  Adnexa/parametria:     Rt: Normal in size, without masses or tenderness.   Lt: Normal in size, without masses or tenderness.  Anus and perineum: Normal  Digital rectal exam: Normal sphincter tone without palpated masses or tenderness  Patient informed chaperone available to be present for breast and pelvic exam. Patient has requested no chaperone to be present. Patient has been advised what will be completed during breast and pelvic exam.   Assessment/Plan:  65 y.o. G3P3003 to establish care.   Well female exam with routine gynecological exam - Plan: Cytology - PAP( [place]). Education provided on SBEs, importance of preventative screenings, current guidelines, high calcium diet, regular exercise, and multivitamin daily.  Labs with PCP.   Postmenopausal - no HRT, no bleeding.   Screen for STD (sexually transmitted disease) - Plan: Cytology - PAP( [place]), RPR, HIV Antibody (routine testing w rflx). GC/chlamydia/trich added to pap.   Screening for cervical cancer - Normal Pap history.  Last Pap over 10 years ago.  Pap with HR HPV today.   Screening for breast cancer - Normal mammogram history.  Continue annual screenings.  Normal breast exam today.  Screening for colon cancer - 2019 colonoscopy. Will repeat at GI's recommended interval.   Screening for osteoporosis - average risk.  Will plan DEXA at age 65.  She is very active with exercise and yard work.  Return in 1 year for annual.   Tamela Gammon DNP, 8:49 AM 09/13/2020

## 2020-09-14 LAB — HIV ANTIBODY (ROUTINE TESTING W REFLEX): HIV 1&2 Ab, 4th Generation: NONREACTIVE

## 2020-09-14 LAB — RPR: RPR Ser Ql: NONREACTIVE

## 2020-09-15 LAB — CYTOLOGY - PAP
Adequacy: ABSENT
Chlamydia: NEGATIVE
Comment: NEGATIVE
Comment: NEGATIVE
Comment: NEGATIVE
Comment: NORMAL
Diagnosis: NEGATIVE
High risk HPV: NEGATIVE
Neisseria Gonorrhea: NEGATIVE
Trichomonas: NEGATIVE

## 2020-09-29 ENCOUNTER — Ambulatory Visit (HOSPITAL_COMMUNITY): Payer: Managed Care, Other (non HMO) | Attending: Cardiology

## 2020-09-30 ENCOUNTER — Encounter (HOSPITAL_COMMUNITY): Payer: Self-pay | Admitting: Cardiovascular Disease

## 2020-10-25 ENCOUNTER — Ambulatory Visit (INDEPENDENT_AMBULATORY_CARE_PROVIDER_SITE_OTHER): Payer: Managed Care, Other (non HMO)

## 2020-10-25 ENCOUNTER — Ambulatory Visit (HOSPITAL_COMMUNITY): Payer: Managed Care, Other (non HMO) | Attending: Cardiovascular Disease

## 2020-10-25 ENCOUNTER — Other Ambulatory Visit: Payer: Self-pay

## 2020-10-25 DIAGNOSIS — R002 Palpitations: Secondary | ICD-10-CM | POA: Diagnosis not present

## 2020-10-25 LAB — ECHOCARDIOGRAM COMPLETE
Area-P 1/2: 4.07 cm2
S' Lateral: 2.8 cm

## 2020-11-18 ENCOUNTER — Ambulatory Visit (HOSPITAL_COMMUNITY)
Admission: EM | Admit: 2020-11-18 | Discharge: 2020-11-18 | Disposition: A | Discharge status: Home / Self Care | Payer: Managed Care, Other (non HMO) | Attending: Urgent Care | Admitting: Urgent Care

## 2020-11-18 ENCOUNTER — Other Ambulatory Visit: Payer: Self-pay

## 2020-11-18 ENCOUNTER — Encounter (HOSPITAL_COMMUNITY): Payer: Self-pay | Admitting: Emergency Medicine

## 2020-11-18 DIAGNOSIS — Z8639 Personal history of other endocrine, nutritional and metabolic disease: Secondary | ICD-10-CM | POA: Insufficient documentation

## 2020-11-18 DIAGNOSIS — Z79899 Other long term (current) drug therapy: Secondary | ICD-10-CM | POA: Diagnosis not present

## 2020-11-18 DIAGNOSIS — E119 Type 2 diabetes mellitus without complications: Secondary | ICD-10-CM | POA: Diagnosis not present

## 2020-11-18 DIAGNOSIS — Z7982 Long term (current) use of aspirin: Secondary | ICD-10-CM | POA: Insufficient documentation

## 2020-11-18 DIAGNOSIS — U071 COVID-19: Secondary | ICD-10-CM | POA: Diagnosis not present

## 2020-11-18 DIAGNOSIS — J069 Acute upper respiratory infection, unspecified: Secondary | ICD-10-CM | POA: Diagnosis not present

## 2020-11-18 DIAGNOSIS — R0981 Nasal congestion: Secondary | ICD-10-CM | POA: Diagnosis present

## 2020-11-18 LAB — RESPIRATORY PANEL BY PCR

## 2020-11-18 MED ORDER — PROMETHAZINE-DM 6.25-15 MG/5ML PO SYRP: 5.0000 mL | ORAL_SOLUTION | Freq: Every evening | ORAL | 0 refills | Status: DC | PRN

## 2020-11-18 MED ORDER — PSEUDOEPHEDRINE HCL 30 MG PO TABS: 30.0000 mg | ORAL_TABLET | Freq: Three times a day (TID) | ORAL | 0 refills | Status: DC | PRN

## 2020-11-18 MED ORDER — CETIRIZINE HCL 10 MG PO TABS: 10.0000 mg | ORAL_TABLET | Freq: Every day | ORAL | 0 refills | Status: DC

## 2020-11-18 MED ORDER — BENZONATATE 100 MG PO CAPS: 100.0000 mg | ORAL_CAPSULE | Freq: Three times a day (TID) | ORAL | 0 refills | Status: DC | PRN

## 2020-11-18 MED ORDER — IPRATROPIUM BROMIDE 0.03 % NA SOLN: 2.0000 | Freq: Two times a day (BID) | NASAL | 0 refills | Status: DC

## 2020-11-18 NOTE — ED Provider Notes (Signed)
[facility]   MRN: [id number] DOB: 1955/12/25  Subjective:   [name] is a 65 y.o. female presenting for 2-day history of acute onset sinus congestion, sinus pressure, throat pain, coughing.  Symptoms are worse when she lays down at night.  Feels like her head is full.  No chest pain, shortness of breath or wheezing.  Patient has a history of diabetes but does not require any medications for this.  She is not a smoker.  No history of respiratory disorders.  No exposure to influenza that she knows of.  No current facility-administered medications for this encounter.  Current Outpatient Medications:    aspirin EC 325 MG tablet, Take 1 tablet (325 mg total) by mouth daily. For 30 days post op for DVT Prophylaxis, Disp: 30 tablet, Rfl: 0   atorvastatin (LIPITOR) 40 MG tablet, Take 1 tablet (40 mg total) by mouth daily., Disp: 90 tablet, Rfl: 1   Cholecalciferol (VITAMIN D3) 50 MCG (2000 UT) TABS, TAKE 1 TABLET BY MOUTH EVERY DAY, Disp: 90 tablet, Rfl: 1   Ciclopirox 0.77 % gel, Apply 1 Act topically 2 (two) times daily., Disp: 100 g, Rfl: 2   Multiple Vitamins-Minerals (MULTIVITAMIN WITH MINERALS) tablet, Take 1 tablet by mouth daily., Disp: , Rfl:    olmesartan-hydrochlorothiazide (BENICAR HCT) 20-12.5 MG tablet, Take 1 tablet by mouth daily., Disp: , Rfl:    Allergies  Allergen Reactions   Amlodipine Other (See Comments)    Constipation     Past Medical History:  Diagnosis Date   Arthritis    right hip   Diabetes mellitus    NO MEDS FOR TREATMENT FOR THIS PER PT.   High cholesterol    Hyperlipidemia    Hypertension    Lung nodule 03/2006   LLL 4-59mm   Pre-diabetes      Past Surgical History:  Procedure Laterality Date   COLONOSCOPY     COLONOSCOPY  2014   OVARIAN BIOPSY     POLYPECTOMY     TOTAL HIP ARTHROPLASTY Right 09/26/2016   TOTAL HIP ARTHROPLASTY Right 09/26/2016   Procedure: TOTAL HIP ARTHROPLASTY ANTERIOR APPROACH;  Surgeon: Renette Butters, MD;  Location: [facility];  Service: Orthopedics;  Laterality: Right;   TUBAL LIGATION      Family History  Problem Relation Age of Onset   Dementia Mother    Esophageal cancer Sister    Diabetes Brother    Kidney disease Brother    Heart disease Brother    Cancer Other        pancreatic cancer   Colon cancer Neg Hx    Rectal cancer Neg Hx    Stomach cancer Neg Hx     Social History   Tobacco Use   Smoking status: Never   Smokeless tobacco: Never  Vaping Use   Vaping Use: Never used  Substance Use Topics   Alcohol use: Yes    Comment: Occas   Drug use: No    ROS   Objective:   Vitals: BP (!) 156/76   Pulse 79   Temp 99 F (37.2 C)   Resp 18   LMP 07/18/2012   SpO2 100%   Physical Exam Constitutional:      General: She is not in acute distress.    Appearance: Normal appearance. She is well-developed. She is not ill-appearing, toxic-appearing or diaphoretic.  HENT:     Head: Normocephalic and atraumatic.     Right Ear: Tympanic membrane, ear  canal and external ear normal. No drainage or tenderness. No middle ear effusion. Tympanic membrane is not erythematous.     Left Ear: Tympanic membrane, ear canal and external ear normal. No drainage or tenderness.  No middle ear effusion. Tympanic membrane is not erythematous.     Nose: Congestion and rhinorrhea present.     Mouth/Throat:     Mouth: Mucous membranes are moist. No oral lesions.     Pharynx: No pharyngeal swelling, oropharyngeal exudate, posterior oropharyngeal erythema or uvula swelling.     Tonsils: No tonsillar exudate or tonsillar abscesses.     Comments: Significant postnasal drainage overlying pharynx. Eyes:     General: No scleral icterus.       Right eye: No discharge.        Left eye: No discharge.     Extraocular Movements: Extraocular movements intact.     Right eye: Normal extraocular motion.     Left eye: Normal extraocular motion.     Conjunctiva/sclera: Conjunctivae normal.      Pupils: Pupils are equal, round, and reactive to light.  Cardiovascular:     Rate and Rhythm: Normal rate and regular rhythm.     Pulses: Normal pulses.     Heart sounds: Normal heart sounds. No murmur heard.   No friction rub. No gallop.  Pulmonary:     Effort: Pulmonary effort is normal. No respiratory distress.     Breath sounds: Normal breath sounds. No stridor. No wheezing, rhonchi or rales.  Musculoskeletal:     Cervical back: Normal range of motion and neck supple.  Lymphadenopathy:     Cervical: No cervical adenopathy.  Skin:    General: Skin is warm and dry.     Findings: No rash.  Neurological:     General: No focal deficit present.     Mental Status: She is alert and oriented to person, place, and time.  Psychiatric:        Mood and Affect: Mood normal.        Behavior: Behavior normal.        Thought Content: Thought content normal.    Assessment and Plan :   PDMP not reviewed this encounter.  1. Viral URI with cough   2. Nasal congestion   3. History of diabetes mellitus    Deferred imaging given clear cardiopulmonary exam, hemodynamically stable vital signs. Respiratory panel pending. Will manage for viral illness such as viral URI, viral syndrome, viral rhinitis, COVID-19, influenza. Recommended supportive care. Offered scripts for symptomatic relief. Testing is pending. Counseled patient on potential for adverse effects with medications prescribed/recommended today, ER and return-to-clinic precautions discussed, patient verbalized understanding.     Jaynee Eagles, [place] 11/18/20 [phone]

## 2020-11-18 NOTE — ED Triage Notes (Signed)
Pt is present today with a cough, sore throat, and congestion. Pt sx started Tuesday

## 2020-11-18 NOTE — Discharge Instructions (Addendum)
We will notify you of your test results as they arrive and may take between 48-72 hours.  I encourage you to sign up for MyChart if you have not already done so as this can be the easiest way for [place] to communicate results to you online or through a phone app.  Generally, we only contact you if it is a positive test result.  In the meantime, if you develop worsening symptoms including fever, chest pain, shortness of breath despite our current treatment plan then please report to the emergency room as this may be a sign of worsening status from possible viral infection.  Otherwise, we will manage this as a viral syndrome. For sore throat or cough try using a honey-based tea. Use 3 teaspoons of honey with juice squeezed from half lemon. Place shaved pieces of ginger into 1/2-1 cup of water and warm over stove top. Then mix the ingredients and repeat every 4 hours as needed. Please take Tylenol 500mg -650mg  every 6 hours for aches and pains, fevers. Hydrate very well with at least 2 liters of water. Eat light meals such as soups to replenish electrolytes and soft fruits, veggies. Start an antihistamine like Zyrtec for postnasal drainage, sinus congestion.  You can take this together with pseudoephedrine (Sudafed) at a dose of 20 mg 2-3 times a day as needed for the same kind of congestion.

## 2020-11-19 LAB — SARS CORONAVIRUS 2 (TAT 6-24 HRS): SARS Coronavirus 2: POSITIVE — AB

## 2021-01-02 ENCOUNTER — Other Ambulatory Visit: Payer: Self-pay | Admitting: Internal Medicine

## 2021-01-02 DIAGNOSIS — E118 Type 2 diabetes mellitus with unspecified complications: Secondary | ICD-10-CM

## 2021-01-02 DIAGNOSIS — I10 Essential (primary) hypertension: Secondary | ICD-10-CM

## 2021-01-06 ENCOUNTER — Other Ambulatory Visit: Payer: Self-pay | Admitting: Internal Medicine

## 2021-01-09 ENCOUNTER — Other Ambulatory Visit: Payer: Self-pay | Admitting: Internal Medicine

## 2021-02-03 ENCOUNTER — Other Ambulatory Visit: Payer: Self-pay

## 2021-02-03 ENCOUNTER — Encounter: Payer: Self-pay | Admitting: Nurse Practitioner

## 2021-02-03 ENCOUNTER — Other Ambulatory Visit: Payer: Self-pay | Admitting: Nurse Practitioner

## 2021-02-03 ENCOUNTER — Ambulatory Visit: Payer: Managed Care, Other (non HMO) | Admitting: Nurse Practitioner

## 2021-02-03 VITALS — BP 136/62 | HR 76 | Temp 98.5°F | Ht 65.75 in | Wt 206.0 lb

## 2021-02-03 DIAGNOSIS — I10 Essential (primary) hypertension: Secondary | ICD-10-CM | POA: Diagnosis not present

## 2021-02-03 LAB — COMPREHENSIVE METABOLIC PANEL
ALT: 17 U/L (ref 0–35)
AST: 21 U/L (ref 0–37)
Albumin: 4.3 g/dL (ref 3.5–5.2)
Alkaline Phosphatase: 58 U/L (ref 39–117)
BUN: 21 mg/dL (ref 6–23)
CO2: 29 mEq/L (ref 19–32)
Calcium: 10.2 mg/dL (ref 8.4–10.5)
Chloride: 104 mEq/L (ref 96–112)
Creatinine, Ser: 0.87 mg/dL (ref 0.40–1.20)
GFR: 70.7 mL/min (ref >60.00)
Glucose, Bld: 96 mg/dL (ref 70–99)
Potassium: 4.1 mEq/L (ref 3.5–5.1)
Sodium: 140 mEq/L (ref 135–145)
Total Bilirubin: 0.5 mg/dL (ref 0.2–1.2)
Total Protein: 8 g/dL (ref 6.0–8.3)

## 2021-02-03 MED ORDER — OLMESARTAN MEDOXOMIL-HCTZ 20-12.5 MG PO TABS: 1.0000 | ORAL_TABLET | Freq: Every day | ORAL | 1 refills | Status: DC

## 2021-02-03 NOTE — Progress Notes (Signed)
Subjective:  Patient ID: [name], female    DOB: 1955/11/29  Age: 66 y.o. MRN: [id number]  CC:  Chief Complaint  Patient presents with   Medication Refill      HPI  This patient arrives today for the above.  She is a Dr. Ronnald Ramp patient and has a history of hypertension.  She takes Benicar, but tells me she has been out of this for a few weeks.  She does monitor her blood pressure at home and tells me at home systolically her blood pressure is are running in the 150s-160s.  Overall she is feeling fairly well despite elevated blood pressure.  Past Medical History:  Diagnosis Date   Arthritis    right hip   Diabetes mellitus    NO MEDS FOR TREATMENT FOR THIS PER PT.   High cholesterol    Hyperlipidemia    Hypertension    Lung nodule 03/2006   LLL 4-21mm   Pre-diabetes       Family History  Problem Relation Age of Onset   Dementia Mother    Esophageal cancer Sister    Diabetes Brother    Kidney disease Brother    Heart disease Brother    Cancer Other        pancreatic cancer   Colon cancer Neg Hx    Rectal cancer Neg Hx    Stomach cancer Neg Hx     Social History   Social History Narrative   Divorced- 3 grown children   Laid off from work in July of 2009   Social History   Tobacco Use   Smoking status: Never   Smokeless tobacco: Never  Substance Use Topics   Alcohol use: Yes    Comment: Occas     Current Meds  Medication Sig   aspirin EC 325 MG tablet Take 1 tablet (325 mg total) by mouth daily. For 30 days post op for DVT Prophylaxis   atorvastatin (LIPITOR) 40 MG tablet Take 1 tablet (40 mg total) by mouth daily.   cetirizine (ZYRTEC ALLERGY) 10 MG tablet Take 1 tablet (10 mg total) by mouth daily.   Cholecalciferol (VITAMIN D3) 50 MCG (2000 UT) TABS TAKE 1 TABLET BY MOUTH EVERY DAY   Ciclopirox 0.77 % gel Apply 1 Act topically 2 (two) times daily.   Multiple Vitamins-Minerals (MULTIVITAMIN WITH MINERALS) tablet Take 1 tablet by mouth  daily.   olmesartan-hydrochlorothiazide (BENICAR HCT) 20-12.5 MG tablet Take 1 tablet by mouth daily.    ROS:  Review of Systems  Eyes:  Negative for blurred vision.  Respiratory:  Negative for shortness of breath.   Cardiovascular:  Negative for chest pain.  Neurological:  Negative for headaches.    Objective:   Today's Vitals: BP 136/62 (BP Location: Left Arm, Patient Position: Sitting, Cuff Size: Normal)    Pulse 76    Temp 98.5 F (36.9 C) (Oral)    Ht 5' 5.75" (1.67 m)    Wt 206 lb (93.4 kg)    LMP 07/18/2012    SpO2 100%    BMI 33.50 kg/m  Vitals with BMI 02/03/2021 11/18/2020 09/13/2020  Height 5' 5.75" - 5' 7.5"  Weight 206 lbs - 210 lbs  BMI 65.9 - 93.57  Systolic 017 793 903  Diastolic 62 76 84  Pulse 76 79 -     Physical Exam Vitals reviewed.  Constitutional:      General: She is not in acute distress.    Appearance: Normal  appearance.  HENT:     Head: Normocephalic and atraumatic.  Neck:     Vascular: No carotid bruit.  Cardiovascular:     Rate and Rhythm: Normal rate and regular rhythm.     Pulses: Normal pulses.     Heart sounds: Normal heart sounds.  Pulmonary:     Effort: Pulmonary effort is normal.     Breath sounds: Normal breath sounds.  Skin:    General: Skin is warm and dry.  Neurological:     General: No focal deficit present.     Mental Status: She is alert and oriented to person, place, and time.  Psychiatric:        Mood and Affect: Mood normal.        Behavior: Behavior normal.        Judgment: Judgment normal.         Assessment and Plan   1. Essential hypertension      Plan: 1.  Blood pressure today in the office is not significantly uncontrolled, however at home it does appear that her blood pressure is running high.  We will check a metabolic panel today assuming blood work comes back normal we will send refill for Benicar to her pharmacy.  Hopefully blood work results will be back before the end of the day so this can be  sent to pharmacy.   Tests ordered Orders Placed This Encounter  Procedures   Comprehensive metabolic panel      No orders of the defined types were placed in this encounter.   Patient to follow-up in approximately 5 months for annual physical exam, or sooner as needed.  Ailene Ards, NP

## 2021-06-05 ENCOUNTER — Encounter (HOSPITAL_COMMUNITY): Payer: Self-pay | Admitting: Emergency Medicine

## 2021-06-05 ENCOUNTER — Ambulatory Visit (HOSPITAL_COMMUNITY)
Admission: EM | Admit: 2021-06-05 | Discharge: 2021-06-05 | Disposition: A | Discharge status: Home / Self Care | Payer: Managed Care, Other (non HMO) | Attending: Student | Admitting: Student

## 2021-06-05 ENCOUNTER — Other Ambulatory Visit: Payer: Self-pay

## 2021-06-05 DIAGNOSIS — L255 Unspecified contact dermatitis due to plants, except food: Secondary | ICD-10-CM

## 2021-06-05 MED ORDER — METHYLPREDNISOLONE SODIUM SUCC 125 MG IJ SOLR
80.0000 mg | Freq: Once | INTRAMUSCULAR | Status: AC
  Administered 2021-06-05: 80 mg via INTRAMUSCULAR

## 2021-06-05 MED ORDER — HYDROXYZINE HCL 25 MG PO TABS: 25.0000 mg | ORAL_TABLET | Freq: Four times a day (QID) | ORAL | 0 refills | Status: DC

## 2021-06-05 MED ORDER — METHYLPREDNISOLONE SODIUM SUCC 125 MG IJ SOLR
INTRAMUSCULAR | Status: AC
  Filled 2021-06-05: qty 2

## 2021-06-05 NOTE — ED Triage Notes (Signed)
Patient c/o poison ivy on left side of face, neck and chest x 2 days.  The area are red and itching.  Patient tried OTC meds.

## 2021-06-05 NOTE — Discharge Instructions (Addendum)
-  Hydroxyzine as needed for itching, up to every 6 hours. This medication will make you drowsy, so avoid before driving or operating machinery. Do not drink alcohol while taking this medication.  -Avoid other antihistamines that will make you drowsy while taking this medication, like benedryl. -You can still use over-the-counter lotions like calamine lotion -Make sure to wash any clothes that have come into contact with the poison ivy

## 2021-06-05 NOTE — ED Provider Notes (Signed)
[facility]    CSN: [id number] Arrival date & time: 06/05/21  1243      History   Chief Complaint Chief Complaint  Patient presents with   Rash    HPI [name] is a 66 y.o. female presenting with poison ivy.  History diabetes, patient states that this is resolved and she no longer requires medications for it.  She states that she was doing yard work 2 days ago, soon after developed pruritic rash on the face and neck.  She has attempted various over-the-counter medications including calamine lotion without relief.  She denies any eye involvement, denies vision changes, eye pain, eye crusting.  She denies shortness of breath, wheezing, chest pain, nausea, vomiting, diarrhea.  HPI  Past Medical History:  Diagnosis Date   Arthritis    right hip   Diabetes mellitus    NO MEDS FOR TREATMENT FOR THIS PER PT.   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 ([facility] 03/16/2017   Chronic right-sided low back pain with right-sided sciatica 01/10/2016   Routine general medical examination at a health care facility 07/16/2015   Obesity (BMI 30.0-34.9) 10/07/2013   DJD (degenerative joint disease) of pelvis 07/09/2013   GERD (gastroesophageal reflux disease) 03/11/2012   Visit for screening mammogram 11/09/2010   Essential hypertension 10/23/2007   Hyperlipidemia with target LDL less than 130 04/04/2007    Past Surgical History:  Procedure Laterality Date   COLONOSCOPY      COLONOSCOPY  2014   OVARIAN BIOPSY     POLYPECTOMY     TOTAL HIP ARTHROPLASTY Right 09/26/2016   TOTAL HIP ARTHROPLASTY Right 09/26/2016   Procedure: TOTAL HIP ARTHROPLASTY ANTERIOR APPROACH;  Surgeon: MRenette Butters MD;  [facility]  Service: Orthopedics;  Laterality: Right;   TUBAL LIGATION      OB History     Gravida  3   Para  3   Term  3   Preterm      AB      Living  3      SAB      IAB      Ectopic      Multiple      Live Births               Home Medications    Prior to Admission medications   Medication Sig Start Date End Date Taking? Authorizing Provider  aspirin EC 325 MG tablet Take 1 tablet (325 mg total) by mouth daily. For 30 days post op for DVT Prophylaxis 09/26/16  Yes MPrudencio BurlyIII, PA-C  atorvastatin (LIPITOR) 40 MG tablet Take 1 tablet (40 mg total) by mouth daily. 06/09/20  Yes JJanith Lima MD  cetirizine (ZYRTEC ALLERGY) 10 MG tablet Take 1 tablet (10 mg total) by mouth daily. 11/18/20  Yes MJaynee Eagles PA-C  Cholecalciferol (VITAMIN D3) 50 MCG (2000 UT) TABS TAKE 1 TABLET BY MOUTH EVERY DAY 01/18/20  Yes  Janith Lima, MD  Ciclopirox 0.77 % gel Apply 1 Act topically 2 (two) times daily. 06/08/20  Yes Janith Lima, MD  hydrOXYzine (ATARAX) 25 MG tablet Take 1 tablet (25 mg total) by mouth every 6 (six) hours. 06/05/21  Yes Hazel Sams, PA-C  Multiple Vitamins-Minerals (MULTIVITAMIN WITH MINERALS) tablet Take 1 tablet by mouth daily.   Yes [provider]  olmesartan-hydrochlorothiazide (BENICAR HCT) 20-12.5 MG tablet Take 1 tablet by mouth daily. 02/03/21  Yes Ailene Ards, NP    Family History Family History  Problem Relation Age of Onset   Dementia Mother    Esophageal cancer Sister    Diabetes Brother    Kidney disease Brother    Heart disease Brother    Cancer Other        pancreatic cancer   Colon cancer Neg Hx    Rectal cancer Neg Hx    Stomach cancer Neg Hx     Social History Social  History   Tobacco Use   Smoking status: Never   Smokeless tobacco: Never  Vaping Use   Vaping Use: Never used  Substance Use Topics   Alcohol use: Yes    Comment: Occas   Drug use: No     Allergies   Amlodipine   Review of Systems Review of Systems  Skin:  Positive for rash.  All other systems reviewed and are negative.   Physical Exam Triage Vital Signs ED Triage Vitals  Enc Vitals Group     BP 06/05/21 1302 138/83     Pulse Rate 06/05/21 1302 65     Resp 06/05/21 1302 18     Temp 06/05/21 1302 98.5 F (36.9 C)     Temp Source 06/05/21 1302 Oral     SpO2 06/05/21 1302 97 %     Weight 06/05/21 1305 200 lb (90.7 kg)     Height 06/05/21 1305 '5\' 7"'$  (1.702 m)     Head Circumference --      Peak Flow --      Pain Score 06/05/21 1304 0     Pain Loc --      Pain Edu? --      Excl. in [place]? --    No data found.  Updated Vital Signs BP 138/83 (BP Location: Left Arm)   Pulse 65   Temp 98.5 F (36.9 C) (Oral)   Resp 18   Ht '5\' 7"'$  (1.702 m)   Wt 200 lb (90.7 kg)   LMP 07/18/2012   SpO2 97%   BMI 31.32 kg/m   Visual Acuity Right Eye Distance:   Left Eye Distance:   Bilateral Distance:    Right Eye Near:   Left Eye Near:    Bilateral Near:     Physical Exam Vitals reviewed.  Constitutional:      General: She is not in acute distress.    Appearance: Normal appearance. She is not ill-appearing or diaphoretic.  HENT:     Head: Normocephalic and atraumatic.  Cardiovascular:     Rate and Rhythm: Normal rate and regular rhythm.     Heart sounds: Normal heart sounds.  Pulmonary:     Effort: Pulmonary effort is normal.     Breath sounds: Normal breath sounds.  Skin:    General: Skin is warm.     Comments: Few patches of urticarial vesicular rash on neck and face. No eye or eyelid involvement. No lip, tongue, uvula swelling or rash.   Neurological:  General: No focal deficit present.     Mental Status: She is alert and oriented to person, place, and  time.  Psychiatric:        Mood and Affect: Mood normal.        Behavior: Behavior normal.        Thought Content: Thought content normal.        Judgment: Judgment normal.     UC Treatments / Results  Labs (all labs ordered are listed, but only abnormal results are displayed) Labs Reviewed - No data to display  EKG   Radiology No results found.  Procedures Procedures (including critical care time)  Medications Ordered in UC Medications  methylPREDNISolone sodium succinate (SOLU-MEDROL) 125 mg/2 mL injection 80 mg (has no administration in time range)    Initial Impression / Assessment and Plan / UC Course  I have reviewed the triage vital signs and the nursing notes.  Pertinent labs & imaging results that were available during my care of the patient were reviewed by me and considered in my medical decision making (see chart for details).     This patient is a very pleasant 66 y.o. year old female presenting with rhus dermatitis. History diabetes but this is now diet controlled only. IM solumedrol administered during visit, and hydroxyzine sent. ED return precautions discussed. Patient verbalizes understanding and agreement.    Final Clinical Impressions(s) / UC Diagnoses   Final diagnoses:  Rhus dermatitis     Discharge Instructions      -Hydroxyzine as needed for itching, up to every 6 hours. This medication will make you drowsy, so avoid before driving or operating machinery. Do not drink alcohol while taking this medication.  -Avoid other antihistamines that will make you drowsy while taking this medication, like benedryl. -You can still use over-the-counter lotions like calamine lotion -Make sure to wash any clothes that have come into contact with the poison ivy   ED Prescriptions     Medication Sig Dispense Auth. Provider   hydrOXYzine (ATARAX) 25 MG tablet Take 1 tablet (25 mg total) by mouth every 6 (six) hours. 12 tablet Hazel Sams, PA-C       PDMP not reviewed this encounter.   Hazel Sams, PA-C 06/05/21 1319

## 2021-07-13 ENCOUNTER — Ambulatory Visit: Payer: Managed Care, Other (non HMO) | Admitting: Internal Medicine

## 2021-07-13 ENCOUNTER — Encounter: Payer: Self-pay | Admitting: Internal Medicine

## 2021-07-13 VITALS — BP 154/84 | HR 69 | Temp 97.8°F | Resp 16 | Ht 67.0 in | Wt 215.0 lb

## 2021-07-13 DIAGNOSIS — E785 Hyperlipidemia, unspecified: Secondary | ICD-10-CM

## 2021-07-13 DIAGNOSIS — E118 Type 2 diabetes mellitus with unspecified complications: Secondary | ICD-10-CM

## 2021-07-13 DIAGNOSIS — Z23 Encounter for immunization: Secondary | ICD-10-CM

## 2021-07-13 DIAGNOSIS — E559 Vitamin D deficiency, unspecified: Secondary | ICD-10-CM

## 2021-07-13 DIAGNOSIS — Z0000 Encounter for general adult medical examination without abnormal findings: Secondary | ICD-10-CM | POA: Diagnosis not present

## 2021-07-13 DIAGNOSIS — E669 Obesity, unspecified: Secondary | ICD-10-CM

## 2021-07-13 DIAGNOSIS — I491 Atrial premature depolarization: Secondary | ICD-10-CM

## 2021-07-13 DIAGNOSIS — I10 Essential (primary) hypertension: Secondary | ICD-10-CM

## 2021-07-13 LAB — HEPATIC FUNCTION PANEL
ALT: 17 U/L (ref 0–35)
AST: 19 U/L (ref 0–37)
Albumin: 4.5 g/dL (ref 3.5–5.2)
Alkaline Phosphatase: 65 U/L (ref 39–117)
Bilirubin, Direct: 0.1 mg/dL (ref 0.0–0.3)
Total Bilirubin: 0.5 mg/dL (ref 0.2–1.2)
Total Protein: 8.2 g/dL (ref 6.0–8.3)

## 2021-07-13 LAB — LIPID PANEL
Cholesterol: 273 mg/dL — ABNORMAL HIGH (ref 0–200)
HDL: 52.5 mg/dL (ref >39.00)
LDL Cholesterol: 195 mg/dL — ABNORMAL HIGH (ref 0–99)
NonHDL: 220.82
Total CHOL/HDL Ratio: 5
Triglycerides: 127 mg/dL (ref 0.0–149.0)
VLDL: 25.4 mg/dL (ref 0.0–40.0)

## 2021-07-13 LAB — VITAMIN D 25 HYDROXY (VIT D DEFICIENCY, FRACTURES): VITD: 34.06 ng/mL (ref 30.00–100.00)

## 2021-07-13 LAB — BASIC METABOLIC PANEL
BUN: 18 mg/dL (ref 6–23)
CO2: 27 mEq/L (ref 19–32)
Calcium: 9.9 mg/dL (ref 8.4–10.5)
Chloride: 102 mEq/L (ref 96–112)
Creatinine, Ser: 0.83 mg/dL (ref 0.40–1.20)
GFR: 74.58 mL/min (ref >60.00)
Glucose, Bld: 90 mg/dL (ref 70–99)
Potassium: 4.1 mEq/L (ref 3.5–5.1)
Sodium: 137 mEq/L (ref 135–145)

## 2021-07-13 LAB — CBC WITH DIFFERENTIAL/PLATELET
Basophils Absolute: 0 10*3/uL (ref 0.0–0.1)
Basophils Relative: 0.5 % (ref 0.0–3.0)
Eosinophils Absolute: 0.2 10*3/uL (ref 0.0–0.7)
Eosinophils Relative: 3.4 % (ref 0.0–5.0)
HCT: 39.8 % (ref 36.0–46.0)
Hemoglobin: 12.8 g/dL (ref 12.0–15.0)
Lymphocytes Relative: 37.2 % (ref 12.0–46.0)
Lymphs Abs: 2.1 10*3/uL (ref 0.7–4.0)
MCHC: 32.2 g/dL (ref 30.0–36.0)
MCV: 81.9 fl (ref 78.0–100.0)
Monocytes Absolute: 0.5 10*3/uL (ref 0.1–1.0)
Monocytes Relative: 7.9 % (ref 3.0–12.0)
Neutro Abs: 3 10*3/uL (ref 1.4–7.7)
Neutrophils Relative %: 51 % (ref 43.0–77.0)
Platelets: 207 10*3/uL (ref 150.0–400.0)
RBC: 4.85 Mil/uL (ref 3.87–5.11)
RDW: 15.6 % — ABNORMAL HIGH (ref 11.5–15.5)
WBC: 5.8 10*3/uL (ref 4.0–10.5)

## 2021-07-13 LAB — MICROALBUMIN / CREATININE URINE RATIO
Creatinine,U: 171.4 mg/dL
Microalb Creat Ratio: 0.4 mg/g (ref 0.0–30.0)
Microalb, Ur: 0.7 mg/dL (ref 0.0–1.9)

## 2021-07-13 LAB — URINALYSIS, ROUTINE W REFLEX MICROSCOPIC
Bilirubin Urine: NEGATIVE
Hgb urine dipstick: NEGATIVE
Ketones, ur: NEGATIVE
Nitrite: NEGATIVE
RBC / HPF: NONE SEEN (ref 0–?)
Specific Gravity, Urine: 1.015 (ref 1.000–1.030)
Total Protein, Urine: NEGATIVE
Urine Glucose: NEGATIVE
Urobilinogen, UA: 1 (ref 0.0–1.0)
pH: 7 (ref 5.0–8.0)

## 2021-07-13 LAB — CK: Total CK: 123 U/L (ref 7–177)

## 2021-07-13 LAB — TSH: TSH: 1.37 u[IU]/mL (ref 0.35–5.50)

## 2021-07-13 LAB — HEMOGLOBIN A1C: Hgb A1c MFr Bld: 6.5 % (ref 4.6–6.5)

## 2021-07-13 MED ORDER — ATORVASTATIN CALCIUM 40 MG PO TABS: 40.0000 mg | ORAL_TABLET | Freq: Every day | ORAL | 1 refills | Status: DC

## 2021-07-13 MED ORDER — NEBIVOLOL HCL 5 MG PO TABS: 5.0000 mg | ORAL_TABLET | Freq: Every day | ORAL | 1 refills | Status: DC

## 2021-07-13 MED ORDER — OLMESARTAN MEDOXOMIL-HCTZ 20-12.5 MG PO TABS: 1.0000 | ORAL_TABLET | Freq: Every day | ORAL | 1 refills | Status: DC

## 2021-07-13 NOTE — Patient Instructions (Signed)

## 2021-07-13 NOTE — Progress Notes (Signed)
Subjective:  Patient ID: [name], female    DOB: 1955-03-29  Age: 66 y.o. MRN: [id number]  CC: Annual Exam, Hypertension, Hyperlipidemia, and Diabetes   HPI [name] presents for a CPX and f/up -   She exercises on a treadmill and does not experience chest pain, shortness of breath, diaphoresis, dizziness, lightheadedness, or edema.  Outpatient Medications Prior to Visit  Medication Sig Dispense Refill   aspirin EC 325 MG tablet Take 1 tablet (325 mg total) by mouth daily. For 30 days post op for DVT Prophylaxis 30 tablet 0   cetirizine (ZYRTEC ALLERGY) 10 MG tablet Take 1 tablet (10 mg total) by mouth daily. 30 tablet 0   Cholecalciferol (VITAMIN D3) 50 MCG (2000 UT) TABS TAKE 1 TABLET BY MOUTH EVERY DAY 90 tablet 1   Ciclopirox 0.77 % gel Apply 1 Act topically 2 (two) times daily. 100 g 2   hydrOXYzine (ATARAX) 25 MG tablet Take 1 tablet (25 mg total) by mouth every 6 (six) hours. 12 tablet 0   Multiple Vitamins-Minerals (MULTIVITAMIN WITH MINERALS) tablet Take 1 tablet by mouth daily.     atorvastatin (LIPITOR) 40 MG tablet Take 1 tablet (40 mg total) by mouth daily. 90 tablet 1   olmesartan-hydrochlorothiazide (BENICAR HCT) 20-12.5 MG tablet Take 1 tablet by mouth daily. 90 tablet 1   No facility-administered medications prior to visit.    ROS Review of Systems  Constitutional:  Positive for unexpected weight change (wt gain). Negative for appetite change, chills, diaphoresis, fatigue and fever.  HENT: Negative.    Eyes: Negative.   Respiratory:  Negative for cough, chest tightness, shortness of breath and wheezing.   Cardiovascular:  Negative for chest pain, palpitations and leg swelling.  Gastrointestinal:  Negative for abdominal pain, constipation, diarrhea, nausea and vomiting.  Endocrine: Negative.   Genitourinary: Negative.   Musculoskeletal:  Positive for arthralgias. Negative for myalgias.  Skin: Negative.   Neurological:  Positive for headaches.  Negative for dizziness and weakness.  Hematological:  Negative for adenopathy. Does not bruise/bleed easily.    Objective:  BP (!) 154/84 (BP Location: Right Arm, Patient Position: Sitting, Cuff Size: Large)   Pulse 69   Temp 97.8 F (36.6 C) (Oral)   Resp 16   Ht '5\' 7"'$  (1.702 m)   Wt 215 lb (97.5 kg)   LMP 07/18/2012   SpO2 96%   BMI 33.67 kg/m   BP Readings from Last 3 Encounters:  07/13/21 (!) 154/84  06/05/21 138/83  02/03/21 136/62    Wt Readings from Last 3 Encounters:  07/13/21 215 lb (97.5 kg)  06/05/21 200 lb (90.7 kg)  02/03/21 206 lb (93.4 kg)    Physical Exam Vitals reviewed.  Constitutional:      Appearance: Normal appearance.  HENT:     Nose: Nose normal.     Mouth/Throat:     Mouth: Mucous membranes are moist.  Eyes:     General: No scleral icterus.    Conjunctiva/sclera: Conjunctivae normal.  Cardiovascular:     Rate and Rhythm: Normal rate and regular rhythm.     Heart sounds: No murmur heard.    Comments: EKG- NSR, 69 bpm Normal EKG Pulmonary:     Effort: Pulmonary effort is normal.     Breath sounds: No stridor. No wheezing, rhonchi or rales.  Abdominal:     General: Abdomen is flat.     Palpations: There is no mass.     Tenderness: There is no abdominal  tenderness. There is no guarding.     Hernia: No hernia is present.  Musculoskeletal:        General: Normal range of motion.     Cervical back: Neck supple.     Right lower leg: No edema.     Left lower leg: No edema.  Lymphadenopathy:     Cervical: No cervical adenopathy.  Skin:    General: Skin is warm and dry.  Neurological:     General: No focal deficit present.     Mental Status: She is alert.  Psychiatric:        Mood and Affect: Mood normal.        Behavior: Behavior normal.     Lab Results  Component Value Date   WBC 5.8 07/13/2021   HGB 12.8 07/13/2021   HCT 39.8 07/13/2021   PLT 207.0 07/13/2021   GLUCOSE 90 07/13/2021   CHOL 273 (H) 07/13/2021   TRIG 127.0  07/13/2021   HDL 52.50 07/13/2021   LDLDIRECT 167.9 11/09/2010   LDLCALC 195 (H) 07/13/2021   ALT 17 07/13/2021   AST 19 07/13/2021   NA 137 07/13/2021   K 4.1 07/13/2021   CL 102 07/13/2021   CREATININE 0.83 07/13/2021   BUN 18 07/13/2021   CO2 27 07/13/2021   TSH 1.37 07/13/2021   HGBA1C 6.5 07/13/2021   MICROALBUR 0.7 07/13/2021    No results found.  Assessment & Plan:   [name] was seen today for annual exam, hypertension, hyperlipidemia and diabetes.  Diagnoses and all orders for this visit:  Premature atrial complexes- Her EKG is negative for PACs. -     TSH; Future -     TSH  Hyperlipidemia with target LDL less than 130- I have asked her to take a statin for cardiovascular risk reduction. -     atorvastatin (LIPITOR) 40 MG tablet; Take 1 tablet (40 mg total) by mouth daily. -     Lipid panel; Future -     TSH; Future -     Hepatic function panel; Future -     CK; Future -     CK -     Hepatic function panel -     TSH -     Lipid panel  Type 2 diabetes mellitus with complication, without long-term current use of insulin ([name])- Her blood sugar is well controlled. -     Microalbumin / creatinine urine ratio; Future -     Hemoglobin A1c; Future -     Hepatic function panel; Future -     HM Diabetes Foot Exam -     Ambulatory referral to Ophthalmology -     Hepatic function panel -     Hemoglobin A1c -     Microalbumin / creatinine urine ratio  Essential hypertension- Her blood pressure is not adequately well controlled and she is symptomatic.  We will add nebivolol to the current antihypertensives. -     Basic metabolic panel; Future -     CBC with Differential/Platelet; Future -     TSH; Future -     Urinalysis, Routine w reflex microscopic; Future -     EKG 12-Lead -     olmesartan-hydrochlorothiazide (BENICAR HCT) 20-12.5 MG tablet; Take 1 tablet by mouth daily. -     nebivolol (BYSTOLIC) 5 MG tablet; Take 1 tablet (5 mg total) by mouth daily. -      Urinalysis, Routine w reflex microscopic -     TSH -  CBC with Differential/Platelet -     Basic metabolic panel  Routine general medical examination at a health care facility- Exam completed, labs reviewed, vaccines reviewed and updated, cancer screenings are up-to-date, patient education was given.  Obesity (BMI 30.0-34.9) -     TSH; Future -     Hemoglobin A1c; Future -     Hemoglobin A1c -     TSH  Vitamin D deficiency disease-her vitamin D level is normal now. -     VITAMIN D 25 Hydroxy (Vit-D Deficiency, Fractures); Future -     VITAMIN D 25 Hydroxy (Vit-D Deficiency, Fractures)  Other orders -     Varicella-zoster vaccine IM (Shingrix)   I am having [name] start on nebivolol. I am also having her maintain her multivitamin with minerals, aspirin EC, Vitamin D3, Ciclopirox, cetirizine, hydrOXYzine, atorvastatin, and olmesartan-hydrochlorothiazide.  Meds ordered this encounter  Medications   atorvastatin (LIPITOR) 40 MG tablet    Sig: Take 1 tablet (40 mg total) by mouth daily.    Dispense:  90 tablet    Refill:  1   olmesartan-hydrochlorothiazide (BENICAR HCT) 20-12.5 MG tablet    Sig: Take 1 tablet by mouth daily.    Dispense:  90 tablet    Refill:  1   nebivolol (BYSTOLIC) 5 MG tablet    Sig: Take 1 tablet (5 mg total) by mouth daily.    Dispense:  90 tablet    Refill:  1     Follow-up: Return in about 6 months (around 01/13/2022).  Scarlette Calico, MD

## 2021-09-14 ENCOUNTER — Encounter: Payer: Self-pay | Admitting: Nurse Practitioner

## 2021-09-14 ENCOUNTER — Ambulatory Visit (INDEPENDENT_AMBULATORY_CARE_PROVIDER_SITE_OTHER): Payer: Managed Care, Other (non HMO) | Admitting: Nurse Practitioner

## 2021-09-14 VITALS — BP 112/68 | HR 68 | Ht 66.75 in | Wt 213.0 lb

## 2021-09-14 DIAGNOSIS — Z01419 Encounter for gynecological examination (general) (routine) without abnormal findings: Secondary | ICD-10-CM | POA: Diagnosis not present

## 2021-09-14 DIAGNOSIS — Z780 Asymptomatic menopausal state: Secondary | ICD-10-CM

## 2021-09-14 NOTE — Progress Notes (Signed)
   IFRAH VEST [birth year] [id number]   History:  66 y.o. G3P3003 presents for annual exam. No GYN complaints. Postmenopausal - no HRT, no bleeding. Normal pap and mammogram history. HTN, HLD, DM managed by PCP.   Gynecologic History Patient's last menstrual period was 07/18/2012.   Contraception/Family planning: post menopausal status Sexually active: Yes  Health Maintenance Last Pap: 09/13/2020. Results were: Normal neg HPV Last mammogram: 08/24/2020. Results were: Normal Last colonoscopy: 08/08/2017. Results were: Normal, 10-year recall Last Dexa: Never  Past medical history, past surgical history, family history and social history were all reviewed and documented in the EPIC chart. Works for [employer]. 3 children, grand and great grandchildren.   ROS:  A ROS was performed and pertinent positives and negatives are included.  Exam:  Vitals:   09/14/21 1550  BP: 112/68  Pulse: 68  SpO2: 98%  Weight: 213 lb (96.6 kg)  Height: 5' 6.75" (1.695 m)    Body mass index is 33.61 kg/m.  General appearance:  Normal Thyroid:  Symmetrical, normal in size, without palpable masses or nodularity. Respiratory  Auscultation:  Clear without wheezing or rhonchi Cardiovascular  Auscultation:  Regular rate, without rubs, murmurs or gallops  Edema/varicosities:  Not grossly evident Abdominal  Soft,nontender, without masses, guarding or rebound.  Liver/spleen:  No organomegaly noted  Hernia:  None appreciated  Skin  Inspection:  Grossly normal Breasts: Examined lying and sitting.   Right: Without masses, retractions, nipple discharge or axillary adenopathy.   Left: Without masses, retractions, nipple discharge or axillary adenopathy. Genitourinary   Inguinal/mons:  Normal without inguinal adenopathy  External genitalia:  Normal appearing vulva with no masses, tenderness, or lesions  BUS/Urethra/Skene's glands:  Normal  Vagina:  Normal appearing with normal color and  discharge, no lesions. Atrophic changes  Cervix:  Normal appearing without discharge or lesions  Uterus:  Difficult to palpate due to body habitus but no gross masses or tenderness  Adnexa/parametria:     Rt: Normal in size, without masses or tenderness.   Lt: Normal in size, without masses or tenderness.  Anus and perineum: Normal  Digital rectal exam: Normal sphincter tone without palpated masses or tenderness  Patient informed chaperone available to be present for breast and pelvic exam. Patient has requested no chaperone to be present. Patient has been advised what will be completed during breast and pelvic exam.   Assessment/Plan:  66 y.o. G3P3003 for annual exam.   Well female exam with routine gynecological exam - Education provided on SBEs, importance of preventative screenings, current guidelines, high calcium diet, regular exercise, and multivitamin daily.  Labs with PCP.   Postmenopausal - no HRT, no bleeding.   Screening for cervical cancer - Normal Pap history.  Will repeat at 5-year interval per guidelines.   Screening for breast cancer - Normal mammogram history.  Continue annual screenings.  Normal breast exam today.  Screening for colon cancer - 2019 colonoscopy. Will repeat at GI's recommended interval.   Screening for osteoporosis - Average risk.  Will plan DEXA at age 66.  She is very active with exercise and yard work.  Return in 1 year for annual.     Tamela Gammon DNP, 4:19 PM 09/14/2021

## 2021-10-28 ENCOUNTER — Other Ambulatory Visit: Payer: Self-pay | Admitting: Internal Medicine

## 2021-10-28 DIAGNOSIS — Z1231 Encounter for screening mammogram for malignant neoplasm of breast: Secondary | ICD-10-CM

## 2021-12-23 ENCOUNTER — Ambulatory Visit
Admission: RE | Admit: 2021-12-23 | Discharge: 2021-12-23 | Disposition: A | Discharge status: Home / Self Care | Payer: Managed Care, Other (non HMO) | Source: Ambulatory Visit | Attending: Internal Medicine | Admitting: Internal Medicine

## 2021-12-23 DIAGNOSIS — Z1231 Encounter for screening mammogram for malignant neoplasm of breast: Secondary | ICD-10-CM

## 2021-12-30 ENCOUNTER — Other Ambulatory Visit: Payer: Self-pay | Admitting: Internal Medicine

## 2021-12-30 DIAGNOSIS — R928 Other abnormal and inconclusive findings on diagnostic imaging of breast: Secondary | ICD-10-CM

## 2022-01-04 LAB — HM DIABETES EYE EXAM

## 2022-01-10 ENCOUNTER — Ambulatory Visit: Payer: Managed Care, Other (non HMO) | Admitting: Internal Medicine

## 2022-01-10 ENCOUNTER — Encounter: Payer: Self-pay | Admitting: Internal Medicine

## 2022-01-10 VITALS — BP 134/78 | HR 91 | Temp 98.4°F | Ht 66.75 in | Wt 218.0 lb

## 2022-01-10 DIAGNOSIS — R1319 Other dysphagia: Secondary | ICD-10-CM

## 2022-01-10 DIAGNOSIS — I10 Essential (primary) hypertension: Secondary | ICD-10-CM

## 2022-01-10 DIAGNOSIS — E118 Type 2 diabetes mellitus with unspecified complications: Secondary | ICD-10-CM

## 2022-01-10 DIAGNOSIS — K219 Gastro-esophageal reflux disease without esophagitis: Secondary | ICD-10-CM | POA: Diagnosis not present

## 2022-01-10 LAB — BASIC METABOLIC PANEL
BUN: 11 mg/dL (ref 6–23)
CO2: 27 mEq/L (ref 19–32)
Calcium: 9.8 mg/dL (ref 8.4–10.5)
Chloride: 102 mEq/L (ref 96–112)
Creatinine, Ser: 0.76 mg/dL (ref 0.40–1.20)
GFR: 82.61 mL/min (ref >60.00)
Glucose, Bld: 93 mg/dL (ref 70–99)
Potassium: 3.4 mEq/L — ABNORMAL LOW (ref 3.5–5.1)
Sodium: 138 mEq/L (ref 135–145)

## 2022-01-10 LAB — CBC WITH DIFFERENTIAL/PLATELET
Basophils Absolute: 0 10*3/uL (ref 0.0–0.1)
Basophils Relative: 0.3 % (ref 0.0–3.0)
Eosinophils Absolute: 0.1 10*3/uL (ref 0.0–0.7)
Eosinophils Relative: 1.7 % (ref 0.0–5.0)
HCT: 38.6 % (ref 36.0–46.0)
Hemoglobin: 12.5 g/dL (ref 12.0–15.0)
Lymphocytes Relative: 33.8 % (ref 12.0–46.0)
Lymphs Abs: 2.4 10*3/uL (ref 0.7–4.0)
MCHC: 32.3 g/dL (ref 30.0–36.0)
MCV: 80.8 fl (ref 78.0–100.0)
Monocytes Absolute: 0.6 10*3/uL (ref 0.1–1.0)
Monocytes Relative: 8 % (ref 3.0–12.0)
Neutro Abs: 4 10*3/uL (ref 1.4–7.7)
Neutrophils Relative %: 56.2 % (ref 43.0–77.0)
Platelets: 203 10*3/uL (ref 150.0–400.0)
RBC: 4.77 Mil/uL (ref 3.87–5.11)
RDW: 15 % (ref 11.5–15.5)
WBC: 7.1 10*3/uL (ref 4.0–10.5)

## 2022-01-10 LAB — HEMOGLOBIN A1C: Hgb A1c MFr Bld: 6.6 % — ABNORMAL HIGH (ref 4.6–6.5)

## 2022-01-10 MED ORDER — ESOMEPRAZOLE MAGNESIUM 40 MG PO CPDR: 40.0000 mg | DELAYED_RELEASE_CAPSULE | Freq: Every day | ORAL | 1 refills | Status: DC

## 2022-01-10 NOTE — Progress Notes (Signed)
Subjective:  Patient ID: [name], female    DOB: 1955/09/12  Age: 67 y.o. MRN: [id number]  CC: Hypertension, Diabetes, and Gastroesophageal Reflux   HPI [name] presents for f/up-  She complains of a 6 month history of heartburn and dysphagia with food. She has not gotten much symptom relief with tums.  Outpatient Medications Prior to Visit  Medication Sig Dispense Refill   aspirin EC 325 MG tablet Take 1 tablet (325 mg total) by mouth daily. For 30 days post op for DVT Prophylaxis 30 tablet 0   atorvastatin (LIPITOR) 40 MG tablet Take 1 tablet (40 mg total) by mouth daily. 90 tablet 1   Cholecalciferol (VITAMIN D3) 50 MCG (2000 UT) TABS TAKE 1 TABLET BY MOUTH EVERY DAY 90 tablet 1   Ciclopirox 0.77 % gel Apply 1 Act topically 2 (two) times daily. 100 g 2   Multiple Vitamins-Minerals (MULTIVITAMIN WITH MINERALS) tablet Take 1 tablet by mouth daily.     nebivolol (BYSTOLIC) 5 MG tablet Take 1 tablet (5 mg total) by mouth daily. 90 tablet 1   olmesartan-hydrochlorothiazide (BENICAR HCT) 20-12.5 MG tablet Take 1 tablet by mouth daily. 90 tablet 1   No facility-administered medications prior to visit.    ROS Review of Systems  Constitutional:  Negative for chills, diaphoresis, fatigue and fever.  HENT:  Positive for trouble swallowing. Negative for sinus pressure and voice change.   Eyes: Negative.   Respiratory:  Negative for cough, chest tightness, shortness of breath and wheezing.   Cardiovascular:  Negative for chest pain, palpitations and leg swelling.  Gastrointestinal:  Negative for abdominal pain, constipation, diarrhea, nausea and vomiting.  Endocrine: Negative.   Genitourinary: Negative.  Negative for difficulty urinating.  Musculoskeletal: Negative.  Negative for arthralgias and myalgias.  Skin: Negative.   Neurological:  Negative for dizziness, weakness and light-headedness.  Hematological:  Negative for adenopathy. Does not bruise/bleed easily.   Psychiatric/Behavioral: Negative.      Objective:  BP 134/78 (BP Location: Left Arm, Patient Position: Sitting, Cuff Size: Large)   Pulse 91   Temp 98.4 F (36.9 C) (Oral)   Ht 5' 6.75" (1.695 m)   Wt 218 lb (98.9 kg)   LMP 07/18/2012   SpO2 95%   BMI 34.40 kg/m   BP Readings from Last 3 Encounters:  01/10/22 134/78  09/14/21 112/68  07/13/21 (!) 154/84    Wt Readings from Last 3 Encounters:  01/10/22 218 lb (98.9 kg)  09/14/21 213 lb (96.6 kg)  07/13/21 215 lb (97.5 kg)    Physical Exam Vitals reviewed.  HENT:     Nose: Nose normal.     Mouth/Throat:     Mouth: Mucous membranes are moist.  Eyes:     General: No scleral icterus.    Conjunctiva/sclera: Conjunctivae normal.  Cardiovascular:     Rate and Rhythm: Normal rate and regular rhythm.     Heart sounds: No murmur heard. Pulmonary:     Effort: Pulmonary effort is normal.     Breath sounds: No stridor. No wheezing, rhonchi or rales.  Abdominal:     General: Abdomen is protuberant. There is no distension.     Palpations: There is no mass.     Tenderness: There is no abdominal tenderness. There is no guarding.     Hernia: No hernia is present.  Musculoskeletal:        General: Normal range of motion.     Cervical back: Neck supple.  Right lower leg: No edema.     Left lower leg: No edema.  Lymphadenopathy:     Cervical: No cervical adenopathy.  Skin:    General: Skin is warm and dry.  Neurological:     General: No focal deficit present.     Mental Status: She is alert.  Psychiatric:        Mood and Affect: Mood normal.        Behavior: Behavior normal.     Lab Results  Component Value Date   WBC 7.1 01/10/2022   HGB 12.5 01/10/2022   HCT 38.6 01/10/2022   PLT 203.0 01/10/2022   GLUCOSE 93 01/10/2022   CHOL 273 (H) 07/13/2021   TRIG 127.0 07/13/2021   HDL 52.50 07/13/2021   LDLDIRECT 167.9 11/09/2010   LDLCALC 195 (H) 07/13/2021   ALT 17 07/13/2021   AST 19 07/13/2021   NA 138  01/10/2022   K 3.4 (L) 01/10/2022   CL 102 01/10/2022   CREATININE 0.76 01/10/2022   BUN 11 01/10/2022   CO2 27 01/10/2022   TSH 1.37 07/13/2021   HGBA1C 6.6 (H) 01/10/2022   MICROALBUR 0.7 07/13/2021    MM 3D SCREEN BREAST BILATERAL  Result Date: 12/29/2021 CLINICAL DATA:  Screening. EXAM: DIGITAL SCREENING BILATERAL MAMMOGRAM WITH TOMOSYNTHESIS AND CAD TECHNIQUE: Bilateral screening digital craniocaudal and mediolateral oblique mammograms were obtained. Bilateral screening digital breast tomosynthesis was performed. The images were evaluated with computer-aided detection. COMPARISON:  Previous exam(s). ACR Breast Density Category b: There are scattered areas of fibroglandular density. FINDINGS: In the right breast, a possible asymmetry warrants further evaluation. In the left breast, no findings suspicious for malignancy. IMPRESSION: Further evaluation is suggested for possible asymmetry in the right breast. RECOMMENDATION: Diagnostic mammogram and possibly ultrasound of the right breast. (Code:[id number]-32M) The patient will be contacted regarding the findings, and additional imaging will be scheduled. BI-RADS CATEGORY  0: Incomplete. Need additional imaging evaluation and/or prior mammograms for comparison. Electronically Signed   By: Lajean Manes M.D.   On: 12/29/2021 09:48    Assessment & Plan:   [name] was seen today for hypertension, diabetes and gastroesophageal reflux.  Diagnoses and all orders for this visit:  Essential hypertension- Her BP is well controlled. -     CBC with Differential/Platelet; Future -     Basic metabolic panel; Future -     Basic metabolic panel -     CBC with Differential/Platelet  Gastroesophageal reflux disease without esophagitis- Will start a PPI. -     CBC with Differential/Platelet; Future -     Cancel: Ambulatory referral to Gastroenterology -     esomeprazole (NEXIUM) 40 MG capsule; Take 1 capsule (40 mg total) by mouth daily. -     CBC with  Differential/Platelet  Type II diabetes mellitus with manifestations ([place])- Her blood sugar is well controlled. -     Hemoglobin A1c; Future -     Basic metabolic panel; Future -     Basic metabolic panel -     Hemoglobin A1c  Esophageal dysphagia -     Ambulatory referral to Gastroenterology   I am having [name] start on esomeprazole. I am also having her maintain her multivitamin with minerals, aspirin EC, Vitamin D3, Ciclopirox, atorvastatin, olmesartan-hydrochlorothiazide, and nebivolol.  Meds ordered this encounter  Medications   esomeprazole (NEXIUM) 40 MG capsule    Sig: Take 1 capsule (40 mg total) by mouth daily.    Dispense:  90 capsule  Refill:  1     Follow-up: Return in about 6 months (around 07/11/2022).  Scarlette Calico, MD

## 2022-01-10 NOTE — Patient Instructions (Signed)
Gastroesophageal Reflux Disease, Adult Gastroesophageal reflux (GER) happens when acid from the stomach flows up into the tube that connects the mouth and the stomach (esophagus). Normally, food travels down the esophagus and stays in the stomach to be digested. However, when a person has GER, food and stomach acid sometimes move back up into the esophagus. If this becomes a more serious problem, the person may be diagnosed with a disease called gastroesophageal reflux disease (GERD). GERD occurs when the reflux: Happens often. Causes frequent or severe symptoms. Causes problems such as damage to the esophagus. When stomach acid comes in contact with the esophagus, the acid may cause inflammation in the esophagus. Over time, GERD may create small holes (ulcers) in the lining of the esophagus. What are the causes? This condition is caused by a problem with the muscle between the esophagus and the stomach (lower esophageal sphincter, or LES). Normally, the LES muscle closes after food passes through the esophagus to the stomach. When the LES is weakened or abnormal, it does not close properly, and that allows food and stomach acid to go back up into the esophagus. The LES can be weakened by certain dietary substances, medicines, and medical conditions, including: Tobacco use. Pregnancy. Having a hiatal hernia. Alcohol use. Certain foods and beverages, such as coffee, chocolate, onions, and peppermint. What increases the risk? You are more likely to develop this condition if you: Have an increased body weight. Have a connective tissue disorder. Take NSAIDs, such as ibuprofen. What are the signs or symptoms? Symptoms of this condition include: Heartburn. Difficult or painful swallowing and the feeling of having a lump in the throat. A bitter taste in the mouth. Bad breath and having a large amount of saliva. Having an upset or bloated stomach and belching. Chest pain. Different conditions can  cause chest pain. Make sure you see your health care provider if you experience chest pain. Shortness of breath or wheezing. Ongoing (chronic) cough or a nighttime cough. Wearing away of tooth enamel. Weight loss. How is this diagnosed? This condition may be diagnosed based on a medical history and a physical exam. To determine if you have mild or severe GERD, your health care provider may also monitor how you respond to treatment. You may also have tests, including: A test to examine your stomach and esophagus with a small camera (endoscopy). A test that measures the acidity level in your esophagus. A test that measures how much pressure is on your esophagus. A barium swallow or modified barium swallow test to show the shape, size, and functioning of your esophagus. How is this treated? Treatment for this condition may vary depending on how severe your symptoms are. Your health care provider may recommend: Changes to your diet. Medicine. Surgery. The goal of treatment is to help relieve your symptoms and to prevent complications. Follow these instructions at home: Eating and drinking  Follow a diet as recommended by your health care provider. This may involve avoiding foods and drinks such as: Coffee and tea, with or without caffeine. Drinks that contain alcohol. Energy drinks and sports drinks. Carbonated drinks or sodas. Chocolate and cocoa. Peppermint and mint flavorings. Garlic and onions. Horseradish. Spicy and acidic foods, including peppers, chili powder, curry powder, vinegar, hot sauces, and barbecue sauce. Citrus fruit juices and citrus fruits, such as oranges, lemons, and limes. Tomato-based foods, such as red sauce, chili, salsa, and pizza with red sauce. Fried and fatty foods, such as donuts, french fries, potato chips, and high-fat dressings.   High-fat meats, such as hot dogs and fatty cuts of red and white meats, such as rib eye steak, sausage, ham, and  bacon. High-fat dairy items, such as whole milk, butter, and cream cheese. Eat small, frequent meals instead of large meals. Avoid drinking large amounts of liquid with your meals. Avoid eating meals during the 2-3 hours before bedtime. Avoid lying down right after you eat. Do not exercise right after you eat. Lifestyle  Do not use any products that contain nicotine or tobacco. These products include cigarettes, chewing tobacco, and vaping devices, such as e-cigarettes. If you need help quitting, ask your health care provider. Try to reduce your stress by using methods such as yoga or meditation. If you need help reducing stress, ask your health care provider. If you are overweight, reduce your weight to an amount that is healthy for you. Ask your health care provider for guidance about a safe weight loss goal. General instructions Pay attention to any changes in your symptoms. Take over-the-counter and prescription medicines only as told by your health care provider. Do not take aspirin, ibuprofen, or other NSAIDs unless your health care provider told you to take these medicines. Wear loose-fitting clothing. Do not wear anything tight around your waist that causes pressure on your abdomen. Raise (elevate) the head of your bed about 6 inches (15 cm). You can use a wedge to do this. Avoid bending over if this makes your symptoms worse. Keep all follow-up visits. This is important. Contact a health care provider if: You have: New symptoms. Unexplained weight loss. Difficulty swallowing or it hurts to swallow. Wheezing or a persistent cough. A hoarse voice. Your symptoms do not improve with treatment. Get help right away if: You have sudden pain in your arms, neck, jaw, teeth, or back. You suddenly feel sweaty, dizzy, or light-headed. You have chest pain or shortness of breath. You vomit and the vomit is green, yellow, or black, or it looks like blood or coffee grounds. You faint. You  have stool that is red, bloody, or black. You cannot swallow, drink, or eat. These symptoms may represent a serious problem that is an emergency. Do not wait to see if the symptoms will go away. Get medical help right away. Call your local emergency services (911 in the [place] Do not drive yourself to the hospital. Summary Gastroesophageal reflux happens when acid from the stomach flows up into the esophagus. GERD is a disease in which the reflux happens often, causes frequent or severe symptoms, or causes problems such as damage to the esophagus. Treatment for this condition may vary depending on how severe your symptoms are. Your health care provider may recommend diet and lifestyle changes, medicine, or surgery. Contact a health care provider if you have new or worsening symptoms. Take over-the-counter and prescription medicines only as told by your health care provider. Do not take aspirin, ibuprofen, or other NSAIDs unless your health care provider told you to do so. Keep all follow-up visits as told by your health care provider. This is important. This information is not intended to replace advice given to you by your health care provider. Make sure you discuss any questions you have with your health care provider. Document Revised: 06/30/2019 Document Reviewed: 06/30/2019 Elsevier Patient Education  2023 [employer]  

## 2022-01-11 ENCOUNTER — Ambulatory Visit: Payer: Managed Care, Other (non HMO)

## 2022-01-11 ENCOUNTER — Ambulatory Visit
Admission: RE | Admit: 2022-01-11 | Discharge: 2022-01-11 | Disposition: A | Discharge status: Home / Self Care | Payer: Managed Care, Other (non HMO) | Source: Ambulatory Visit | Attending: Internal Medicine | Admitting: Internal Medicine

## 2022-01-11 DIAGNOSIS — R928 Other abnormal and inconclusive findings on diagnostic imaging of breast: Secondary | ICD-10-CM

## 2022-01-12 ENCOUNTER — Other Ambulatory Visit: Payer: Managed Care, Other (non HMO)

## 2022-02-13 ENCOUNTER — Encounter: Payer: Self-pay | Admitting: Internal Medicine

## 2022-02-22 ENCOUNTER — Telehealth: Payer: Self-pay | Admitting: Internal Medicine

## 2022-02-22 NOTE — Telephone Encounter (Signed)
Patient would like to be referred for a colonoscopy.  Please advise  Patient #:  [phone]

## 2022-02-26 ENCOUNTER — Other Ambulatory Visit: Payer: Self-pay | Admitting: Internal Medicine

## 2022-02-26 DIAGNOSIS — E785 Hyperlipidemia, unspecified: Secondary | ICD-10-CM

## 2022-02-26 DIAGNOSIS — K635 Polyp of colon: Secondary | ICD-10-CM | POA: Insufficient documentation

## 2022-04-19 ENCOUNTER — Encounter: Payer: Self-pay | Admitting: Gastroenterology

## 2022-06-23 ENCOUNTER — Ambulatory Visit (AMBULATORY_SURGERY_CENTER): Payer: Managed Care, Other (non HMO)

## 2022-06-23 ENCOUNTER — Encounter: Payer: Self-pay | Admitting: Gastroenterology

## 2022-06-23 VITALS — Ht 67.0 in | Wt 210.0 lb

## 2022-06-23 DIAGNOSIS — Z86010 Personal history of colonic polyps: Secondary | ICD-10-CM

## 2022-06-23 MED ORDER — NA SULFATE-K SULFATE-MG SULF 17.5-3.13-1.6 GM/177ML PO SOLN: 1.0000 | Freq: Once | ORAL | 0 refills | Status: AC

## 2022-06-23 NOTE — Progress Notes (Signed)
No egg or soy allergy known to patient  No issues known to pt with past sedation with any surgeries or procedures Patient denies ever being told they had issues or difficulty with intubation  No FH of Malignant Hyperthermia Pt is not on diet pills Pt is not on  home 02  Pt is not on blood thinners  Pt has issues sometimes with constipation  No A fib or A flutter Have any cardiac testing pending--no Pt instructed to use Singlecare.com or GoodRx for a price reduction on prep  Patient's chart reviewed by Cathlyn Parsons CNRA prior to previsit and patient appropriate for the [facility].  Previsit completed and red dot placed by patient's name on their procedure day (on provider's schedule).   Can ambulate independently

## 2022-07-09 ENCOUNTER — Encounter: Payer: Self-pay | Admitting: Certified Registered Nurse Anesthetist

## 2022-07-12 ENCOUNTER — Encounter: Payer: Self-pay | Admitting: Gastroenterology

## 2022-07-12 ENCOUNTER — Ambulatory Visit (AMBULATORY_SURGERY_CENTER): Payer: Managed Care, Other (non HMO) | Admitting: Gastroenterology

## 2022-07-12 VITALS — BP 126/71 | HR 62 | Temp 98.7°F | Resp 13 | Ht 66.75 in | Wt 210.0 lb

## 2022-07-12 DIAGNOSIS — Z86010 Personal history of colonic polyps: Secondary | ICD-10-CM

## 2022-07-12 DIAGNOSIS — D125 Benign neoplasm of sigmoid colon: Secondary | ICD-10-CM | POA: Diagnosis not present

## 2022-07-12 DIAGNOSIS — Z09 Encounter for follow-up examination after completed treatment for conditions other than malignant neoplasm: Secondary | ICD-10-CM | POA: Diagnosis not present

## 2022-07-12 MED ORDER — SODIUM CHLORIDE 0.9 % IV SOLN: 500.0000 mL | INTRAVENOUS | Status: DC

## 2022-07-12 NOTE — Progress Notes (Signed)
Called to room to assist during endoscopic procedure.  Patient ID and intended procedure confirmed with present staff. Received instructions for my participation in the procedure from the performing physician.  

## 2022-07-12 NOTE — Progress Notes (Signed)
Report given to PACU, vss 

## 2022-07-12 NOTE — Op Note (Signed)
[facility] Patient Name: [name] Procedure Date: 07/12/2022 7:05 AM MRN: [id number] Endoscopist: Napoleon Form , MD, [id number] Age: 67 Referring MD:  Date of Birth: [birth year] Gender: Female Account #: [id number] Procedure:                Colonoscopy Indications:              High risk colon cancer surveillance: Personal                            history of adenoma less than 10 mm in size Medicines:                Monitored Anesthesia Care Procedure:                Pre-Anesthesia Assessment:                           - Prior to the procedure, a History and Physical                            was performed, and patient medications and                            allergies were reviewed. The patient's tolerance of                            previous anesthesia was also reviewed. The risks                            and benefits of the procedure and the sedation                            options and risks were discussed with the patient.                            All questions were answered, and informed consent                            was obtained. Prior Anticoagulants: The patient has                            taken no anticoagulant or antiplatelet agents. ASA                            Grade Assessment: II - A patient with mild systemic                            disease. After reviewing the risks and benefits,                            the patient was deemed in satisfactory condition to                            undergo the procedure.  After obtaining informed consent, the colonoscope                            was passed under direct vision. Throughout the                            procedure, the patient's blood pressure, pulse, and                            oxygen saturations were monitored continuously. The                            [id number]-HQ190L Colonoscope [id number] was introduced                            through the anus  and advanced to the the cecum,                            identified by appendiceal orifice and ileocecal                            valve. The colonoscopy was performed without                            difficulty. The patient tolerated the procedure                            well. The quality of the bowel preparation was                            adequate. The ileocecal valve, appendiceal orifice,                            and rectum were photographed. Scope In: 8:09:46 AM Scope Out: 8:32:56 AM Scope Withdrawal Time: 0 hours 14 minutes 32 seconds  Total Procedure Duration: 0 hours 23 minutes 10 seconds  Findings:                 The perianal and digital rectal examinations were                            normal.                           An 11 mm polyp was found in the sigmoid colon. The                            polyp was pedunculated. The polyp was removed with                            a hot snare. Resection and retrieval were complete.                           Non-bleeding internal hemorrhoids were found during  retroflexion. The hemorrhoids were medium-sized. Complications:            No immediate complications. Estimated Blood Loss:     Estimated blood loss was minimal. Impression:               - One 11 mm polyp in the sigmoid colon, removed                            with a hot snare. Resected and retrieved.                           - Non-bleeding internal hemorrhoids. Recommendation:           - Patient has a contact number available for                            emergencies. The signs and symptoms of potential                            delayed complications were discussed with the                            patient. Return to normal activities tomorrow.                            Written discharge instructions were provided to the                            patient.                           - Resume previous diet.                            - Continue present medications.                           - Await pathology results.                           - Repeat colonoscopy in 3 years for surveillance. Napoleon Form, MD 07/12/2022 8:42:29 AM This report has been signed electronically.

## 2022-07-12 NOTE — Progress Notes (Signed)
[facility] History and Physical   Primary Care Physician:  Etta Grandchild, MD   Reason for Procedure:  History of adenomatous colon polyps  Plan:    Surveillance colonoscopy with possible interventions as needed     HPI: [name] is a very pleasant 67 y.o. female here for surveillance colonoscopy. Denies any nausea, vomiting, abdominal pain, melena or bright red blood per rectum  The risks and benefits as well as alternatives of endoscopic procedure(s) have been discussed and reviewed. All questions answered. The patient agrees to proceed.    Past Medical History:  Diagnosis Date   Arthritis    right hip   Diabetes mellitus    NO MEDS FOR TREATMENT FOR THIS PER PT.   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 Authorizing Provider  atorvastatin (LIPITOR) 40 MG tablet TAKE 1 TABLET BY MOUTH EVERY DAY 02/26/22  Yes Etta Grandchild, MD  Ciclopirox 0.77 % gel Apply 1 Act topically 2 (two) times daily. 06/08/20  Yes Etta Grandchild, MD  meloxicam (MOBIC) 15 MG tablet Take 15 mg by mouth daily as needed. 06/21/22  Yes [provider]  Multiple Vitamins-Minerals (MULTIVITAMIN WITH MINERALS) tablet Take 1 tablet by mouth daily.   Yes [provider]  olmesartan-hydrochlorothiazide (BENICAR HCT) 20-12.5 MG tablet Take 1 tablet by mouth daily. 07/13/21  Yes Etta Grandchild, MD  aspirin EC 325 MG tablet Take 1 tablet (325 mg total) by mouth  daily. For 30 days post op for DVT Prophylaxis 09/26/16   Albina Billet III, PA-C  Cholecalciferol (VITAMIN D3) 50 MCG (2000 UT) TABS TAKE 1 TABLET BY MOUTH EVERY DAY Patient not taking: Reported on 06/23/2022 01/18/20   Etta Grandchild, MD  esomeprazole (NEXIUM) 40 MG capsule Take 1 capsule (40 mg total) by mouth daily. Patient not taking: Reported on 06/23/2022 01/10/22   Etta Grandchild, MD  nebivolol (BYSTOLIC) 5 MG tablet Take 1 tablet (5 mg total) by mouth daily. Patient not taking: Reported on 06/23/2022 07/13/21   Etta Grandchild, MD    Current Outpatient Medications  Medication Sig Dispense Refill   atorvastatin (LIPITOR) 40 MG tablet TAKE 1 TABLET BY MOUTH EVERY DAY 90 tablet 1   Ciclopirox 0.77 % gel Apply 1 Act topically 2 (two) times daily. 100 g 2   meloxicam (MOBIC) 15 MG tablet Take 15 mg by mouth daily as needed.     Multiple Vitamins-Minerals (MULTIVITAMIN WITH MINERALS) tablet Take 1 tablet by mouth daily.     olmesartan-hydrochlorothiazide (BENICAR HCT) 20-12.5 MG tablet Take 1 tablet by mouth daily. 90 tablet 1   aspirin EC 325 MG tablet Take 1 tablet (325 mg total) by mouth daily. For 30 days post op for DVT Prophylaxis 30 tablet 0   Cholecalciferol (VITAMIN D3) 50 MCG (2000 UT) TABS TAKE 1 TABLET BY MOUTH EVERY  DAY (Patient not taking: Reported on 06/23/2022) 90 tablet 1   esomeprazole (NEXIUM) 40 MG capsule Take 1 capsule (40 mg total) by mouth daily. (Patient not taking: Reported on 06/23/2022) 90 capsule 1   nebivolol (BYSTOLIC) 5 MG tablet Take 1 tablet (5 mg total) by mouth daily. (Patient not taking: Reported on 06/23/2022) 90 tablet 1   Current Facility-Administered Medications  Medication Dose Route Frequency Provider Last Rate Last Admin   0.9 %  sodium chloride infusion  500 mL Intravenous Continuous Genova Kiner, Eleonore Chiquito, MD        Allergies as of 07/12/2022 - Review Complete 07/12/2022  Allergen Reaction Noted   Amlodipine Other (See Comments) 08/30/2017     Family History  Problem Relation Age of Onset   Dementia Mother    Esophageal cancer Sister    Diabetes Brother    Kidney disease Brother    Heart disease Brother    Cancer Other        pancreatic cancer   Colon cancer Neg Hx    Rectal cancer Neg Hx    Stomach cancer Neg Hx     Social History   Socioeconomic History   Marital status: Divorced    Spouse name: Not on file   Number of children: Not on file   Years of education: Not on file   Highest education level: Not on file  Occupational History   Not on file  Tobacco Use   Smoking status: Never    Passive exposure: Never   Smokeless tobacco: Never  Vaping Use   Vaping Use: Never used  Substance and Sexual Activity   Alcohol use: Yes    Comment: Occas   Drug use: No   Sexual activity: Yes    Partners: Male    Birth control/protection: Surgical, Post-menopausal    Comment: 1st intercourse 65 yo-5 partners  Other Topics Concern   Not on file  Social History Narrative   Divorced- 3 grown children   Laid off from work in July of 2009   Social Determinants of Corporate investment banker Strain: Not on file  Food Insecurity: Not on file  Transportation Needs: Not on file  Physical Activity: Not on file  Stress: Not on file  Social Connections: Not on file  Intimate Partner Violence: Not on file    Review of Systems:  All other review of systems negative except as mentioned in the HPI.  Physical Exam: Vital signs in last 24 hours: Blood Pressure (Abnormal) 167/98   Pulse 77   Temperature 98.7 F (37.1 C)   Respiration (Abnormal) 27   Height 5' 6.75" (1.695 m)   Weight 210 lb (95.3 kg)   Last Menstrual Period 07/18/2012   Oxygen Saturation 98%   Body Mass Index 33.14 kg/m  General:   Alert, NAD Lungs:  Clear .   Heart:  Regular rate and rhythm Abdomen:  Soft, nontender and nondistended. Neuro/Psych:  Alert and cooperative. Normal mood and affect. A and O x 3  Reviewed labs, radiology  imaging, old records and pertinent past GI work up  Patient is appropriate for planned procedure(s) and anesthesia in an ambulatory setting   K. Scherry Ran , MD [phone]

## 2022-07-12 NOTE — Patient Instructions (Signed)

## 2022-07-13 ENCOUNTER — Telehealth: Payer: Self-pay | Admitting: *Deleted

## 2022-07-13 NOTE — Telephone Encounter (Signed)
  Follow up Call-     07/12/2022    7:13 AM  Call back number  Post procedure Call Back phone  # [phone]  Permission to leave phone message Yes    Post procedure follow up phone call. No answer at number given.  Left message on voicemail.

## 2022-07-17 ENCOUNTER — Other Ambulatory Visit: Payer: Self-pay | Admitting: Internal Medicine

## 2022-07-17 DIAGNOSIS — I10 Essential (primary) hypertension: Secondary | ICD-10-CM

## 2022-08-03 ENCOUNTER — Encounter: Payer: Self-pay | Admitting: Gastroenterology

## 2022-08-15 ENCOUNTER — Telehealth: Payer: Self-pay | Admitting: Gastroenterology

## 2022-08-15 NOTE — Telephone Encounter (Signed)
Spoke with the patient. She reports previous issues with constipation. She has been experiencing an increase of flatus and incomplete bowel movements. "I pass gas a lot and my bowel movements are strings." She does not always feel she has emptied after her bowel movement. She also reports feeling "like my food is going up and down" "it came up on me when I was sleeping." Offered an appointment for evaluation. Declines. The patient will call back if she does not improve. She wants to try daily Miralax first. Advised she should not lie down for at least 2 hours after eating.

## 2022-08-15 NOTE — Telephone Encounter (Signed)
PT is experiencing a lot of flatulence and stringy BM after having colonoscopy. Please advise

## 2022-09-11 ENCOUNTER — Other Ambulatory Visit: Payer: Self-pay | Admitting: Internal Medicine

## 2022-09-11 DIAGNOSIS — I10 Essential (primary) hypertension: Secondary | ICD-10-CM

## 2022-09-12 ENCOUNTER — Other Ambulatory Visit: Payer: Self-pay | Admitting: Internal Medicine

## 2022-09-12 DIAGNOSIS — I10 Essential (primary) hypertension: Secondary | ICD-10-CM

## 2022-10-12 ENCOUNTER — Other Ambulatory Visit: Payer: Self-pay | Admitting: Internal Medicine

## 2022-10-12 ENCOUNTER — Telehealth: Payer: Self-pay | Admitting: Internal Medicine

## 2022-10-12 DIAGNOSIS — I10 Essential (primary) hypertension: Secondary | ICD-10-CM

## 2022-10-12 MED ORDER — OLMESARTAN MEDOXOMIL-HCTZ 20-12.5 MG PO TABS
1.0000 | ORAL_TABLET | Freq: Every day | ORAL | 0 refills | Status: DC
Start: 2022-10-12 — End: 2022-11-16

## 2022-10-12 NOTE — Telephone Encounter (Signed)
Pt has upcoming appt and is completely out of her medication.

## 2022-10-12 NOTE — Telephone Encounter (Signed)
Prescription Request  10/12/2022  LOV: 01/10/2022  What is the name of the medication or equipment? olmesartan-hydrochlorothiazide (BENICAR HCT) 20-12.5 MG tablet   Have you contacted your pharmacy to request a refill? No   Which pharmacy would you like this sent to?  [id number] [place], [place] - [address] [place] [address] Phone: [phone] Fax: [phone]    Patient notified that their request is being sent to the clinical staff for review and that they should receive a response within 2 business days.   Please advise at Mobile [phone] (mobile)

## 2022-11-06 ENCOUNTER — Other Ambulatory Visit: Payer: Self-pay | Admitting: Internal Medicine

## 2022-11-06 DIAGNOSIS — I10 Essential (primary) hypertension: Secondary | ICD-10-CM

## 2022-11-16 ENCOUNTER — Ambulatory Visit: Payer: Managed Care, Other (non HMO) | Admitting: Internal Medicine

## 2022-11-16 ENCOUNTER — Encounter: Payer: Self-pay | Admitting: Internal Medicine

## 2022-11-16 VITALS — BP 140/80 | HR 74 | Temp 98.4°F | Resp 16 | Ht 66.75 in | Wt 216.8 lb

## 2022-11-16 DIAGNOSIS — E118 Type 2 diabetes mellitus with unspecified complications: Secondary | ICD-10-CM | POA: Diagnosis not present

## 2022-11-16 DIAGNOSIS — M1610 Unilateral primary osteoarthritis, unspecified hip: Secondary | ICD-10-CM

## 2022-11-16 DIAGNOSIS — M25551 Pain in right hip: Secondary | ICD-10-CM

## 2022-11-16 DIAGNOSIS — G8929 Other chronic pain: Secondary | ICD-10-CM | POA: Insufficient documentation

## 2022-11-16 DIAGNOSIS — I10 Essential (primary) hypertension: Secondary | ICD-10-CM | POA: Diagnosis not present

## 2022-11-16 DIAGNOSIS — K219 Gastro-esophageal reflux disease without esophagitis: Secondary | ICD-10-CM | POA: Diagnosis not present

## 2022-11-16 DIAGNOSIS — Z0000 Encounter for general adult medical examination without abnormal findings: Secondary | ICD-10-CM | POA: Diagnosis not present

## 2022-11-16 DIAGNOSIS — E785 Hyperlipidemia, unspecified: Secondary | ICD-10-CM

## 2022-11-16 DIAGNOSIS — Z0001 Encounter for general adult medical examination with abnormal findings: Secondary | ICD-10-CM | POA: Insufficient documentation

## 2022-11-16 DIAGNOSIS — E2839 Other primary ovarian failure: Secondary | ICD-10-CM | POA: Insufficient documentation

## 2022-11-16 DIAGNOSIS — G4452 New daily persistent headache (NDPH): Secondary | ICD-10-CM | POA: Insufficient documentation

## 2022-11-16 LAB — CBC WITH DIFFERENTIAL/PLATELET
Basophils Absolute: 0 10*3/uL (ref 0.0–0.1)
Basophils Relative: 0.3 % (ref 0.0–3.0)
Eosinophils Absolute: 0.1 10*3/uL (ref 0.0–0.7)
Eosinophils Relative: 1.6 % (ref 0.0–5.0)
HCT: 38.2 % (ref 36.0–46.0)
Hemoglobin: 12.3 g/dL (ref 12.0–15.0)
Lymphocytes Relative: 33.5 % (ref 12.0–46.0)
Lymphs Abs: 2.8 10*3/uL (ref 0.7–4.0)
MCHC: 32.1 g/dL (ref 30.0–36.0)
MCV: 81.7 fL (ref 78.0–100.0)
Monocytes Absolute: 0.6 10*3/uL (ref 0.1–1.0)
Monocytes Relative: 7.6 % (ref 3.0–12.0)
Neutro Abs: 4.7 10*3/uL (ref 1.4–7.7)
Neutrophils Relative %: 57 % (ref 43.0–77.0)
Platelets: 199 10*3/uL (ref 150.0–400.0)
RBC: 4.67 Mil/uL (ref 3.87–5.11)
RDW: 16.1 % — ABNORMAL HIGH (ref 11.5–15.5)
WBC: 8.3 10*3/uL (ref 4.0–10.5)

## 2022-11-16 LAB — URINALYSIS, ROUTINE W REFLEX MICROSCOPIC
Bilirubin Urine: NEGATIVE
Hgb urine dipstick: NEGATIVE
Ketones, ur: NEGATIVE
Nitrite: NEGATIVE
RBC / HPF: NONE SEEN (ref 0–?)
Specific Gravity, Urine: 1.02 (ref 1.000–1.030)
Total Protein, Urine: NEGATIVE
Urine Glucose: NEGATIVE
Urobilinogen, UA: 1 (ref 0.0–1.0)
pH: 6.5 (ref 5.0–8.0)

## 2022-11-16 LAB — LIPID PANEL
Cholesterol: 191 mg/dL (ref 0–200)
HDL: 46.7 mg/dL (ref >39.00)
LDL Cholesterol: 120 mg/dL — ABNORMAL HIGH (ref 0–99)
NonHDL: 144.08
Total CHOL/HDL Ratio: 4
Triglycerides: 120 mg/dL (ref 0.0–149.0)
VLDL: 24 mg/dL (ref 0.0–40.0)

## 2022-11-16 LAB — HEPATIC FUNCTION PANEL
ALT: 16 U/L (ref 0–35)
AST: 18 U/L (ref 0–37)
Albumin: 4.4 g/dL (ref 3.5–5.2)
Alkaline Phosphatase: 63 U/L (ref 39–117)
Bilirubin, Direct: 0 mg/dL (ref 0.0–0.3)
Total Bilirubin: 0.4 mg/dL (ref 0.2–1.2)
Total Protein: 8 g/dL (ref 6.0–8.3)

## 2022-11-16 LAB — MICROALBUMIN / CREATININE URINE RATIO
Creatinine,U: 78.9 mg/dL
Microalb Creat Ratio: 0.9 mg/g (ref 0.0–30.0)
Microalb, Ur: <0.7 mg/dL (ref 0.0–1.9)

## 2022-11-16 LAB — BASIC METABOLIC PANEL
BUN: 15 mg/dL (ref 6–23)
CO2: 28 meq/L (ref 19–32)
Calcium: 9.8 mg/dL (ref 8.4–10.5)
Chloride: 105 meq/L (ref 96–112)
Creatinine, Ser: 0.8 mg/dL (ref 0.40–1.20)
GFR: 77.21 mL/min (ref >60.00)
Glucose, Bld: 88 mg/dL (ref 70–99)
Potassium: 3.6 meq/L (ref 3.5–5.1)
Sodium: 140 meq/L (ref 135–145)

## 2022-11-16 LAB — HEMOGLOBIN A1C: Hgb A1c MFr Bld: 6.5 % (ref 4.6–6.5)

## 2022-11-16 MED ORDER — ATORVASTATIN CALCIUM 40 MG PO TABS: 40.0000 mg | ORAL_TABLET | Freq: Every day | ORAL | 1 refills | Status: DC

## 2022-11-16 MED ORDER — MELOXICAM 15 MG PO TABS: 15.0000 mg | ORAL_TABLET | Freq: Every day | ORAL | 0 refills | Status: DC | PRN

## 2022-11-16 MED ORDER — OLMESARTAN MEDOXOMIL-HCTZ 20-12.5 MG PO TABS: 1.0000 | ORAL_TABLET | Freq: Every day | ORAL | 1 refills | Status: DC

## 2022-11-16 NOTE — Patient Instructions (Signed)

## 2022-11-16 NOTE — Progress Notes (Signed)
Subjective:  Patient ID: [name], female    DOB: 13-Apr-1955  Age: 67 y.o. MRN: [id number]  CC: Annual Exam, Hypertension, Headache, and Diabetes   HPI [name] presents for a CPX and f/up --   Discussed the use of AI scribe software for clinical note transcription with the patient, who gave verbal consent to proceed.  History of Present Illness   The patient presents with a persistent, daily headache that has been ongoing for several months. The headache is described as a nagging, pounding sensation that occasionally wakes her from sleep. She denies any associated nausea, vomiting, numbness, weakness, or tingling. She also denies any difficulty with walking or talking. The patient suspects that the headache may be stress related.  In addition to the headache, the patient reports a pain in her right hip that radiates down her leg. This pain has been present for a couple of months and occasionally interferes with her ability to walk. She has been self-medicating with over-the-counter pain medication, which she believes may be for sciatic nerve pain.  The patient also reports a history of PVCs and describes a recent episode of sharp pain that resolved spontaneously. She denies any associated palpitations, dizziness, or lightheadedness. She also denies any chest pain, shortness of breath, cold sweats, or clammy feeling during physical activity.  She denies any issues with snoring or sleep apnea. She also denies any trouble or painful swallowing.       Outpatient Medications Prior to Visit  Medication Sig Dispense Refill   aspirin EC 325 MG tablet Take 1 tablet (325 mg total) by mouth daily. For 30 days post op for DVT Prophylaxis 30 tablet 0   Multiple Vitamins-Minerals (MULTIVITAMIN WITH MINERALS) tablet Take 1 tablet by mouth daily.     atorvastatin (LIPITOR) 40 MG tablet TAKE 1 TABLET BY MOUTH EVERY DAY 90 tablet 1   meloxicam (MOBIC) 15 MG tablet Take 15 mg by mouth daily as  needed.     olmesartan-hydrochlorothiazide (BENICAR HCT) 20-12.5 MG tablet Take 1 tablet by mouth daily. 30 tablet 0   Ciclopirox 0.77 % gel Apply 1 Act topically 2 (two) times daily. 100 g 2   Cholecalciferol (VITAMIN D3) 50 MCG (2000 UT) TABS TAKE 1 TABLET BY MOUTH EVERY DAY (Patient not taking: Reported on 06/23/2022) 90 tablet 1   esomeprazole (NEXIUM) 40 MG capsule Take 1 capsule (40 mg total) by mouth daily. (Patient not taking: Reported on 06/23/2022) 90 capsule 1   nebivolol (BYSTOLIC) 5 MG tablet Take 1 tablet (5 mg total) by mouth daily. (Patient not taking: Reported on 06/23/2022) 90 tablet 1   No facility-administered medications prior to visit.    ROS Review of Systems  Constitutional:  Positive for unexpected weight change (wt gain). Negative for appetite change, chills, diaphoresis and fatigue.  HENT: Negative.    Eyes:  Negative for visual disturbance.  Respiratory:  Negative for apnea, cough and wheezing.   Cardiovascular:  Negative for chest pain, palpitations and leg swelling.  Gastrointestinal:  Negative for abdominal pain, constipation, diarrhea, nausea and vomiting.  Genitourinary: Negative.  Negative for difficulty urinating.  Musculoskeletal:  Positive for arthralgias. Negative for back pain and myalgias.  Skin: Negative.   Neurological:  Positive for headaches. Negative for dizziness and weakness.  Hematological:  Negative for adenopathy. Does not bruise/bleed easily.  Psychiatric/Behavioral: Negative.    All other systems reviewed and are negative.   Objective:  BP (!) 140/80   Pulse 74  Temp 98.4 F (36.9 C) (Oral)   Resp 16   Ht 5' 6.75" (1.695 m)   Wt 216 lb 12.8 oz (98.3 kg)   LMP 07/18/2012   SpO2 97%   BMI 34.21 kg/m   BP Readings from Last 3 Encounters:  11/16/22 (!) 140/80  07/12/22 126/71  01/10/22 134/78    Wt Readings from Last 3 Encounters:  11/16/22 216 lb 12.8 oz (98.3 kg)  07/12/22 210 lb (95.3 kg)  06/23/22 210 lb (95.3 kg)     Physical Exam Vitals reviewed.  Constitutional:      Appearance: Normal appearance.  HENT:     Mouth/Throat:     Mouth: Mucous membranes are moist.  Eyes:     General: No scleral icterus.    Conjunctiva/sclera: Conjunctivae normal.  Cardiovascular:     Rate and Rhythm: Normal rate and regular rhythm.     Heart sounds: Normal heart sounds, S1 normal and S2 normal. No murmur heard.    Comments: EKG- NSR, 70 bpm No LVH, Q waves, or ST/T waves  Pulmonary:     Effort: Pulmonary effort is normal.     Breath sounds: No stridor. No wheezing, rhonchi or rales.  Abdominal:     General: Abdomen is flat.     Palpations: There is no mass.     Tenderness: There is no abdominal tenderness. There is no guarding.     Hernia: No hernia is present.  Musculoskeletal:        General: Normal range of motion.     Cervical back: Neck supple.     Right lower leg: No edema.     Left lower leg: No edema.  Skin:    General: Skin is warm and dry.  Neurological:     General: No focal deficit present.     Mental Status: She is alert and oriented to person, place, and time. Mental status is at baseline.  Psychiatric:        Mood and Affect: Mood normal.        Behavior: Behavior normal.        Thought Content: Thought content normal.        Judgment: Judgment normal.     Lab Results  Component Value Date   WBC 8.3 11/16/2022   HGB 12.3 11/16/2022   HCT 38.2 11/16/2022   PLT 199.0 11/16/2022   GLUCOSE 88 11/16/2022   CHOL 191 11/16/2022   TRIG 120.0 11/16/2022   HDL 46.70 11/16/2022   LDLDIRECT 167.9 11/09/2010   LDLCALC 120 (H) 11/16/2022   ALT 16 11/16/2022   AST 18 11/16/2022   NA 140 11/16/2022   K 3.6 11/16/2022   CL 105 11/16/2022   CREATININE 0.80 11/16/2022   BUN 15 11/16/2022   CO2 28 11/16/2022   TSH 1.37 07/13/2021   HGBA1C 6.5 11/16/2022   MICROALBUR <0.7 11/16/2022    MM DIAG BREAST TOMO UNI RIGHT  Result Date: 01/11/2022 CLINICAL DATA:  Patient returns after  screening study for evaluation of possible RIGHT breast asymmetry. EXAM: DIGITAL DIAGNOSTIC UNILATERAL RIGHT MAMMOGRAM WITH TOMOSYNTHESIS TECHNIQUE: Right digital diagnostic mammography and breast tomosynthesis was performed. COMPARISON:  Previous exam(s). ACR Breast Density Category b: There are scattered areas of fibroglandular density. FINDINGS: Additional 2-D and 3-D images are performed. These views show no persistent asymmetry in the LATERAL aspect of the RIGHT breast. No suspicious mass, distortion, or microcalcifications are identified to suggest presence of malignancy. IMPRESSION: No mammographic evidence for malignancy. RECOMMENDATION: Screening mammogram  in one year.(Code:SM-B-01Y) I have discussed the findings and recommendations with the patient. If applicable, a reminder letter will be sent to the patient regarding the next appointment. BI-RADS CATEGORY  1: Negative. Electronically Signed   By: Norva Pavlov M.D.   On: 01/11/2022 14:37   Assessment & Plan:   Hyperlipidemia with target LDL less than 130- Will risk stratify with a CCS. -     Lipid panel; Future -     Hepatic function panel; Future -     CT CARDIAC SCORING (SELF PAY ONLY); Future -     Atorvastatin Calcium; Take 1 tablet (40 mg total) by mouth daily.  Dispense: 90 tablet; Refill: 1  Essential hypertension- Her BP is not at goal. She will improve her lifestyle modifications. -     Urinalysis, Routine w reflex microscopic; Future -     Hepatic function panel; Future -     CBC with Differential/Platelet; Future -     Basic metabolic panel; Future -     EKG 12-Lead -     Olmesartan Medoxomil-HCTZ; Take 1 tablet by mouth daily.  Dispense: 90 tablet; Refill: 1  Gastroesophageal reflux disease without esophagitis -     CBC with Differential/Platelet; Future  Type II diabetes mellitus with manifestations (HCC)- Her blood sugar is well controlled. -     Microalbumin / creatinine urine ratio; Future -     Urinalysis,  Routine w reflex microscopic; Future -     Hemoglobin A1c; Future -     Basic metabolic panel; Future -     HM Diabetes Foot Exam -     Olmesartan Medoxomil-HCTZ; Take 1 tablet by mouth daily.  Dispense: 90 tablet; Refill: 1  New daily persistent headache - Will evaluate for mass, bleed, NPH. -     MR BRAIN WO CONTRAST; Future  Chronic right hip pain -     Ambulatory referral to Sports Medicine  Estrogen deficiency -     DG Bone Density; Future  Encounter for general adult medical examination with abnormal findings- Exam completed, labs reviewed, vaccines reviewed, cancer screenings addressed, pt ed material was given.   DJD (degenerative joint disease) of pelvis -     Meloxicam; Take 1 tablet (15 mg total) by mouth daily as needed.  Dispense: 90 tablet; Refill: 0     Follow-up: Return in about 6 months (around 05/16/2023).  Sanda Linger, MD

## 2022-11-17 ENCOUNTER — Other Ambulatory Visit: Payer: Self-pay | Admitting: Internal Medicine

## 2022-11-17 DIAGNOSIS — E2839 Other primary ovarian failure: Secondary | ICD-10-CM

## 2022-11-21 ENCOUNTER — Ambulatory Visit: Payer: Managed Care, Other (non HMO) | Admitting: Family Medicine

## 2022-11-21 ENCOUNTER — Ambulatory Visit (INDEPENDENT_AMBULATORY_CARE_PROVIDER_SITE_OTHER): Payer: Managed Care, Other (non HMO)

## 2022-11-21 VITALS — BP 148/84 | HR 74 | Ht 66.75 in | Wt 218.0 lb

## 2022-11-21 DIAGNOSIS — M25511 Pain in right shoulder: Secondary | ICD-10-CM

## 2022-11-21 DIAGNOSIS — M25551 Pain in right hip: Secondary | ICD-10-CM

## 2022-11-21 DIAGNOSIS — G8929 Other chronic pain: Secondary | ICD-10-CM

## 2022-11-21 DIAGNOSIS — M5441 Lumbago with sciatica, right side: Secondary | ICD-10-CM

## 2022-11-21 DIAGNOSIS — M545 Other chronic pain: Secondary | ICD-10-CM

## 2022-11-21 NOTE — Patient Instructions (Addendum)
Thank you for coming in today.   Please get an Xray today before you leave   I've referred you to Physical Therapy.  Let us know if you don't hear from them in one week.   Check back in 6 weeks 

## 2022-11-21 NOTE — Progress Notes (Unsigned)
   Rubin Payor, PhD, LAT, ATC acting as a scribe for Clementeen Graham, MD.  [name] is a 67 y.o. female who presents to [facility] at [facility] today for R hip pain ongoing for a couple months. Pt locates pain to the R-side of her low back, proximal R buttock, w/ radiating pain along the posterior aspect of her R thigh. She c/o pain disturbing her sleep  Low back pain: yes Radiating pain: yes LE numbness/tingling: no LE weakness: yes Aggravates: transitioning to stand, prolonged sitting, sitting in a low sitting chair/seat Treatments tried: IBU, Tylenol,   Pertinent review of systems: ***  Relevant historical information: ***   Exam:  LMP 07/18/2012  General: Well Developed, well nourished, and in no acute distress.   MSK: ***    Lab and Radiology Results No results found for this or any previous visit (from the past 72 hour(s)). No results found.     Assessment and Plan: 67 y.o. female with ***   PDMP not reviewed this encounter. No orders of the defined types were placed in this encounter.  No orders of the defined types were placed in this encounter.    Discussed warning signs or symptoms. Please see discharge instructions. Patient expresses understanding.   ***

## 2022-12-10 ENCOUNTER — Other Ambulatory Visit: Payer: Managed Care, Other (non HMO)

## 2022-12-11 NOTE — Progress Notes (Signed)
Right hip x-ray shows a intact well-appearing right hip replacement.  There is some arthritis in both SI joints at the back of the pelvis/low back area.  There is some arthritis in the left hip.

## 2022-12-11 NOTE — Progress Notes (Signed)
Lumbar spine x-ray shows some arthritis changes in the low back.

## 2022-12-11 NOTE — Progress Notes (Signed)
Right shoulder x-ray shows mild arthritis

## 2023-01-02 ENCOUNTER — Ambulatory Visit: Payer: Managed Care, Other (non HMO) | Admitting: Family Medicine

## 2023-02-15 ENCOUNTER — Ambulatory Visit: Payer: Self-pay | Admitting: Internal Medicine

## 2023-02-15 ENCOUNTER — Telehealth: Payer: Managed Care, Other (non HMO) | Admitting: Physician Assistant

## 2023-02-15 DIAGNOSIS — L239 Allergic contact dermatitis, unspecified cause: Secondary | ICD-10-CM | POA: Diagnosis not present

## 2023-02-15 MED ORDER — TRIAMCINOLONE ACETONIDE 0.1 % EX CREA: 1.0000 | TOPICAL_CREAM | Freq: Two times a day (BID) | CUTANEOUS | 0 refills | Status: DC

## 2023-02-15 MED ORDER — PREDNISONE 10 MG PO TABS: ORAL_TABLET | ORAL | 0 refills | Status: AC

## 2023-02-15 NOTE — Telephone Encounter (Signed)
   Chief Complaint: rash Symptoms: torso rash, severe itching Frequency: 3 weeks Pertinent Negatives: Patient denies fever, swelling, sob Disposition: [] ED /[] Urgent Care (no appt availability in office) / [] Appointment(In office/virtual)/ [x]  [facility]/ [] Home Care/ [] Refused Recommended Disposition /[] [facility]/ []  Follow-up with PCP Additional Notes: Patient reports she has had small red bumps on her torso x 3 weeks that are severely itchy. Patient reports she has been using hydrocortisone cream with no relief. Patient denies starting any new medications or coming in contact with any allergens. Per protocol, attempted to schedule in office appt. No availability until tomorrow. VV scheduled today at patient's request. Patient advised to call back with worsening symptoms. Patient verbalized understanding.      Copied From CRM [id number]. Reason for Triage: Patient is itching and have red spots all over, has been going on for about 2 weeks. She is scared she may have shingles. Call back number is [phone]  Reason for Disposition  SEVERE itching (i.e., interferes with sleep, normal activities or school)  Answer Assessment - Initial Assessment Questions 1. APPEARANCE of RASH: "Describe the rash." (e.g., spots, blisters, raised areas, skin peeling, scaly)     Red spots 2. SIZE: "How big are the spots?" (e.g., tip of pen, eraser, coin; inches, centimeters)     Tip of a pen 3. LOCATION: "Where is the rash located?"     Torso 4. COLOR: "What color is the rash?" (Note: It is difficult to assess rash color in people with darker-colored skin. When this situation occurs, simply ask the caller to describe what they see.)     redness 5. ONSET: "When did the rash begin?"     3 weeks ago 6. FEVER: "Do you have a fever?" If Yes, ask: "What is your temperature, how was it measured, and when did it start?"     none 7. ITCHING: "Does the rash itch?" If Yes, ask: "How bad is the  itch?" (Scale 1-10; or mild, moderate, severe)     severe 8. CAUSE: "What do you think is causing the rash?"    I'm not sure 9. MEDICINE FACTORS: "Have you started any new medicines within the last 2 weeks?" (e.g., antibiotics)      none 10. OTHER SYMPTOMS: "Do you have any other symptoms?" (e.g., dizziness, headache, sore throat, joint pain)       none  Protocols used: Rash or Redness - [facility]

## 2023-02-15 NOTE — Progress Notes (Signed)
Virtual Visit Consent   [name], you are scheduled for a virtual visit with a [facility] provider today. Just as with appointments in the office, your consent must be obtained to participate. Your consent will be active for this visit and any virtual visit you may have with one of our providers in the next 365 days. If you have a MyChart account, a copy of this consent can be sent to you electronically.  As this is a virtual visit, video technology does not allow for your provider to perform a traditional examination. This may limit your provider's ability to fully assess your condition. If your provider identifies any concerns that need to be evaluated in person or the need to arrange testing (such as labs, EKG, etc.), we will make arrangements to do so. Although advances in technology are sophisticated, we cannot ensure that it will always work on either your end or our end. If the connection with a video visit is poor, the visit may have to be switched to a telephone visit. With either a video or telephone visit, we are not always able to ensure that we have a secure connection.  By engaging in this virtual visit, you consent to the provision of healthcare and authorize for your insurance to be billed (if applicable) for the services provided during this visit. Depending on your insurance coverage, you may receive a charge related to this service.  I need to obtain your verbal consent now. Are you willing to proceed with your visit today? ROBBY PIRANI has provided verbal consent on 02/15/2023 for a virtual visit (video or telephone). [name], [place]  Date: 02/15/2023 4:06 PM   Virtual Visit via Video Note   I, [name], connected with  [name]  ([id number], [birth year]) on 02/15/23 at  4:00 PM EST by a video-enabled telemedicine application and verified that I am speaking with the correct person using two identifiers.  Location: Patient: Virtual Visit Location  Patient: Home Provider: Virtual Visit Location Provider: Home Office   I discussed the limitations of evaluation and management by telemedicine and the availability of in person appointments. The patient expressed understanding and agreed to proceed.    History of Present Illness: [name] is a 68 y.o. who identifies as a female who was assigned female at birth, and is being seen today for a pruritic rash first noted 3 weeks ago, initially of her torso and upper chest, now extending to her legs, sides and under the breast. Sometimes gets irritated but denies true painful rash. Notes some smaller red spots. Denies drainage from any noted lesions. Has been using OTC Hydrocortisone cream -- helps but only somewhat.  She notes the rash gets more irritated with hot showers. Denies change to soaps, lotions, etc. Thought could be due to detergent so she changed this without change in symptoms.  No known sick contact. Denies recent travel or hotel stays.    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) 03/16/2017   Chronic right-sided low back pain 01/10/2016   Obesity (BMI 30.0-34.9) 10/07/2013   DJD (degenerative joint disease) of pelvis 07/09/2013   GERD (gastroesophageal reflux disease) 03/11/2012   Visit for screening mammogram 11/09/2010   Essential hypertension 10/23/2007    Hyperlipidemia with target LDL less than 130 04/04/2007    Allergies:  Allergies  Allergen Reactions   Amlodipine Other (See Comments)    Constipation    Medications:  Current Outpatient Medications:    predniSONE (DELTASONE) 10 MG tablet, Take 4 tablets (40 mg total) by mouth daily with breakfast for 4 days, THEN 3 tablets (30 mg total) daily with breakfast for 4 days, THEN 2 tablets (20 mg total) daily with breakfast for 3 days, THEN 1 tablet (10 mg total) daily with breakfast for 3 days., Disp: 37 tablet, Rfl: 0   triamcinolone cream (KENALOG) 0.1 %, Apply 1 Application topically 2 (two) times daily., Disp: 30 g, Rfl: 0   aspirin EC 325 MG tablet, Take 1 tablet (325 mg total) by mouth daily. For 30 days post op for DVT Prophylaxis, Disp: 30 tablet, Rfl: 0   atorvastatin (LIPITOR) 40 MG tablet, Take 1 tablet (40 mg total) by mouth daily., Disp: 90 tablet, Rfl: 1   meloxicam (MOBIC) 15 MG tablet, Take 1 tablet (15 mg total) by mouth daily as needed. (Patient not taking: Reported on 11/21/2022), Disp: 90 tablet, Rfl: 0   Multiple Vitamins-Minerals (MULTIVITAMIN WITH MINERALS) tablet, Take 1 tablet by mouth daily., Disp: , Rfl:    olmesartan-hydrochlorothiazide (BENICAR HCT) 20-12.5 MG tablet, Take 1 tablet by mouth daily., Disp: 90 tablet, Rfl: 1  Observations/Objective: Patient is well-developed, well-nourished in no acute distress.  Resting comfortably at home.  Head is normocephalic, atraumatic.  No labored breathing. Speech is clear and coherent with logical content.  Patient is alert and oriented at baseline.  Widespread, erythematous papulovesicular lesions, some larger and others pinpoint, noted of upper chest. She is unable to show other areas due to no help with camera placement.   Assessment and Plan: 1. Allergic contact dermatitis, unspecified trigger (Primary) - predniSONE (DELTASONE) 10 MG tablet; Take 4 tablets (40 mg total) by mouth daily with breakfast for 4 days, THEN 3  tablets (30 mg total) daily with breakfast for 4 days, THEN 2 tablets (20 mg total) daily with breakfast for 3 days, THEN 1 tablet (10 mg total) daily with breakfast for 3 days.  Dispense: 37 tablet; Refill: 0 - triamcinolone cream (KENALOG) 0.1 %; Apply 1 Application topically 2 (two) times daily.  Dispense: 30 g; Refill: 0  Supportive measures and OTC medications reviewed. Triamcinolone topical. Prednisone per orders. Follow-up with PCP for any non-resolving, new or worsening symptoms despite treatment.  Follow Up Instructions: I discussed the assessment and treatment plan with the patient. The patient was provided an opportunity to ask questions and all were answered. The patient agreed with the plan and demonstrated an understanding of the instructions.  A copy of instructions were sent to the patient via MyChart unless otherwise noted below.   The patient was advised to call back or seek an in-person evaluation if the symptoms worsen or if the condition fails to improve as anticipated.    [name], PA-C

## 2023-02-15 NOTE — Patient Instructions (Signed)
[name], thank you for joining [name], PA-C for today's virtual visit.  While this provider is not your primary care provider (PCP), if your PCP is located in our provider database this encounter information will be shared with them immediately following your visit.   A [facility] MyChart account gives you access to today's visit and all your visits, tests, and labs performed at [facility] " click here if you don't have a [facility] MyChart account or go to [email or web address]  Consent: (Patient) [name] provided verbal consent for this virtual visit at the beginning of the encounter.  Current Medications:  Current Outpatient Medications:    aspirin EC 325 MG tablet, Take 1 tablet (325 mg total) by mouth daily. For 30 days post op for DVT Prophylaxis, Disp: 30 tablet, Rfl: 0   atorvastatin (LIPITOR) 40 MG tablet, Take 1 tablet (40 mg total) by mouth daily., Disp: 90 tablet, Rfl: 1   Ciclopirox 0.77 % gel, Apply 1 Act topically 2 (two) times daily., Disp: 100 g, Rfl: 2   meloxicam (MOBIC) 15 MG tablet, Take 1 tablet (15 mg total) by mouth daily as needed. (Patient not taking: Reported on 11/21/2022), Disp: 90 tablet, Rfl: 0   Multiple Vitamins-Minerals (MULTIVITAMIN WITH MINERALS) tablet, Take 1 tablet by mouth daily., Disp: , Rfl:    olmesartan-hydrochlorothiazide (BENICAR HCT) 20-12.5 MG tablet, Take 1 tablet by mouth daily., Disp: 90 tablet, Rfl: 1   Medications ordered in this encounter:  No orders of the defined types were placed in this encounter.    *If you need refills on other medications prior to your next appointment, please contact your pharmacy*  Follow-Up: Call back or seek an in-person evaluation if the symptoms worsen or if the condition fails to improve as anticipated.  [facility] [phone]  Other Instructions Contact Dermatitis Dermatitis is when your skin becomes red, sore, and swollen.  Contact  dermatitis happens when your body reacts to something that touches the skin. There are 2 types: Irritant contact dermatitis. This is when something bothers your skin, like soap. Allergic contact dermatitis. This is when your skin touches something you are allergic to, like poison ivy. What are the causes? Irritant contact dermatitis may be caused by: Makeup. Soaps. Detergents. Bleaches. Acids. Metals, like nickel. Allergic contact dermatitis may be caused by: Plants. Chemicals. Jewelry. Latex. Medicines. Preservatives. These are things added to products to help them last longer. There may be some in your clothes. What increases the risk? Having a job where you have to be near things that bother your skin. Having asthma or eczema. What are the signs or symptoms?  Dry or flaky skin. Redness. Cracks. Itching. Moderate symptoms of this condition include: Pain or a burning feeling. Blisters. Blood or clear fluid coming from cracks in your skin. Swelling. This may be on your eyelids, mouth, or genitals. How is this treated? Your doctor will find out what is making your skin react. Then, you can protect your skin. You may need to use: Steroid creams, ointments, or medicines. Antibiotics or other ointments, if you have a skin infection. Lotion or medicines to help with itching. A bandage. Follow these instructions at home: Skin care Put moisturizer on your skin when it needs it. Put cool, wet cloths on your skin (cool compresses). Put a baking soda paste on your skin. Stir water into baking soda until it looks like a paste. Do not scratch your skin. Try not to have  things rub up against your skin. Avoid tight clothing. Avoid using soaps, perfumes, and dyes. Check your skin every day for signs of infection. Check for: More redness, swelling, or pain. More fluid or blood. Warmth. Pus or a bad smell. Medicines Take or apply over-the-counter and prescription medicines only as  told by your doctor. If you were prescribed antibiotics, take or apply them as told by your doctor. Do not stop using them even if you start to feel better. Bathing Take a bath with: Epsom salts. Baking soda. Colloidal oatmeal. Bathe less often. Bathe in warm water. Try not to use hot water. Bandage care If you were given a bandage, change it as told by your doctor. Wash your hands with soap and water for at least 20 seconds before and after you change your bandage. If you cannot use soap and water, use hand sanitizer. General instructions Avoid the things that caused your reaction. If you don't know what caused it, keep a journal. Write down: What you eat. What skin products you use. What you drink. What you wear. Contact a doctor if: You do not get better with treatment. You get worse. You have signs of infection. You have a fever. You have new symptoms. Your bone or joint near the area hurts after the skin has healed. Get help right away if: You see red streaks coming from the area. The area turns darker. You have trouble breathing. This information is not intended to replace advice given to you by your health care provider. Make sure you discuss any questions you have with your health care provider. Document Revised: 06/24/2021 Document Reviewed: 06/24/2021 Elsevier Patient Education  2024 Elsevier Inc.   If you have been instructed to have an in-person evaluation today at a local Urgent Care facility, please use the link below. It will take you to a list of all of our available [facility], including address, phone number and hours of operation. Please do not delay care.  [facility]  If you or a family member do not have a primary care provider, use the link below to schedule a visit and establish care. When you choose a [facility] primary care physician or advanced practice provider, you gain a long-term partner in health. Find a Primary Care  Provider  Learn more about [facility]'s in-office and virtual care options: [facility] - Get Care Now

## 2023-02-17 ENCOUNTER — Other Ambulatory Visit: Payer: Self-pay | Admitting: Internal Medicine

## 2023-02-17 DIAGNOSIS — M1610 Unilateral primary osteoarthritis, unspecified hip: Secondary | ICD-10-CM

## 2023-02-28 ENCOUNTER — Ambulatory Visit: Payer: Managed Care, Other (non HMO) | Admitting: Family Medicine

## 2023-02-28 ENCOUNTER — Encounter: Payer: Self-pay | Admitting: Family Medicine

## 2023-02-28 VITALS — BP 120/78 | HR 81 | Temp 98.7°F | Ht 66.75 in | Wt 221.4 lb

## 2023-02-28 DIAGNOSIS — R21 Rash and other nonspecific skin eruption: Secondary | ICD-10-CM | POA: Diagnosis not present

## 2023-02-28 DIAGNOSIS — L299 Pruritus, unspecified: Secondary | ICD-10-CM

## 2023-02-28 DIAGNOSIS — L239 Allergic contact dermatitis, unspecified cause: Secondary | ICD-10-CM | POA: Diagnosis not present

## 2023-02-28 LAB — COMPREHENSIVE METABOLIC PANEL
ALT: 17 U/L (ref 0–35)
AST: 21 U/L (ref 0–37)
Albumin: 4.4 g/dL (ref 3.5–5.2)
Alkaline Phosphatase: 59 U/L (ref 39–117)
BUN: 19 mg/dL (ref 6–23)
CO2: 25 meq/L (ref 19–32)
Calcium: 9.9 mg/dL (ref 8.4–10.5)
Chloride: 102 meq/L (ref 96–112)
Creatinine, Ser: 0.69 mg/dL (ref 0.40–1.20)
GFR: 90.76 mL/min (ref >60.00)
Glucose, Bld: 94 mg/dL (ref 70–99)
Potassium: 3.7 meq/L (ref 3.5–5.1)
Sodium: 137 meq/L (ref 135–145)
Total Bilirubin: 0.5 mg/dL (ref 0.2–1.2)
Total Protein: 8.2 g/dL (ref 6.0–8.3)

## 2023-02-28 LAB — CBC WITH DIFFERENTIAL/PLATELET
Basophils Absolute: 0 10*3/uL (ref 0.0–0.1)
Basophils Relative: 0.3 % (ref 0.0–3.0)
Eosinophils Absolute: 0.1 10*3/uL (ref 0.0–0.7)
Eosinophils Relative: 1.8 % (ref 0.0–5.0)
HCT: 37.9 % (ref 36.0–46.0)
Hemoglobin: 12.2 g/dL (ref 12.0–15.0)
Lymphocytes Relative: 37.6 % (ref 12.0–46.0)
Lymphs Abs: 2.7 10*3/uL (ref 0.7–4.0)
MCHC: 32.3 g/dL (ref 30.0–36.0)
MCV: 81.5 fL (ref 78.0–100.0)
Monocytes Absolute: 0.6 10*3/uL (ref 0.1–1.0)
Monocytes Relative: 8.3 % (ref 3.0–12.0)
Neutro Abs: 3.7 10*3/uL (ref 1.4–7.7)
Neutrophils Relative %: 52 % (ref 43.0–77.0)
Platelets: 200 10*3/uL (ref 150.0–400.0)
RBC: 4.65 Mil/uL (ref 3.87–5.11)
RDW: 16 % — ABNORMAL HIGH (ref 11.5–15.5)
WBC: 7.2 10*3/uL (ref 4.0–10.5)

## 2023-02-28 LAB — SEDIMENTATION RATE: Sed Rate: 71 mm/h — ABNORMAL HIGH (ref 0–30)

## 2023-02-28 LAB — C-REACTIVE PROTEIN: CRP: <1 mg/dL (ref 0.5–20.0)

## 2023-02-28 NOTE — Progress Notes (Unsigned)
   Acute Office Visit  Subjective:     Patient ID: [name], female    DOB: 24-Dec-1955, 68 y.o.   MRN: [id number]  No chief complaint on file.   HPI Patient is in today for evaluation of rash to chest and torso.  Rash has been present for the last month. Has tried a prednisone taper as well as triamcinolone cream that was given by Malva Cogan, PA with a televisit. Reports that symptoms Denies new exposures, new medications, new animals, new foods. Has never seen an allergist in the past. Denies any shortness of breath, coughing, wheezing, oral swelling or itching, other symptoms. Medical history as outlined below.   ROS Per HPI      Objective:    LMP 07/18/2012    Physical Exam Vitals and nursing note reviewed.  Constitutional:      Appearance: Normal appearance. She is normal weight.  HENT:     Head: Normocephalic and atraumatic.     Right Ear: Tympanic membrane and ear canal normal.     Left Ear: Tympanic membrane and ear canal normal.     Nose: Nose normal.  Eyes:     Extraocular Movements: Extraocular movements intact.     Pupils: Pupils are equal, round, and reactive to light.  Cardiovascular:     Rate and Rhythm: Normal rate and regular rhythm.     Heart sounds: Normal heart sounds.  Pulmonary:     Effort: Pulmonary effort is normal.     Breath sounds: Normal breath sounds.  Musculoskeletal:        General: Normal range of motion.     Cervical back: Normal range of motion.  Neurological:     General: No focal deficit present.     Mental Status: She is alert and oriented to person, place, and time.  Psychiatric:        Mood and Affect: Mood normal.        Thought Content: Thought content normal.   No results found for any visits on 02/28/23.      Assessment & Plan:  ***  No orders of the defined types were placed in this encounter.   No follow-ups on file.  Moshe Cipro, FNP

## 2023-03-02 ENCOUNTER — Encounter: Payer: Self-pay | Admitting: Family Medicine

## 2023-03-02 NOTE — Patient Instructions (Signed)
 We are checking labs today, will be in contact with any results that require further attention  We have placed a referral for you today.  Someone will be reaching out to get you scheduled.  Follow-up with me for new or worsening symptoms.

## 2023-03-03 LAB — ALLERGENS(14) FOODS
Allergen Apple, IgE: <0.1 kU/L
Allergen Black Pepper IgE: <0.1 kU/L
Allergen Cinnamon IgE: <0.1 kU/L
Allergen Coconut IgE: <0.1 kU/L
Allergen Garlic IgE: <0.1 kU/L
Allergen Grape IgE: <0.1 kU/L
Allergen Pear IgE: <0.1 kU/L
Allergen Strawberry IgE: <0.1 kU/L
Allergen, Peach f95: <0.1 kU/L
F045-IgE Yeast: <0.1 kU/L
F089-IgE Mustard: <0.1 kU/L
F242-IgE Bing Cherry: <0.1 kU/L
Lemon: <0.1 kU/L
Vanilla: <0.1 kU/L

## 2023-03-03 LAB — IGE CAT/DOG W/COMPONENT REFLEX
E001-IgE Cat Dander: <0.1 kU/L
E005-IgE Dog Dander: <0.1 kU/L

## 2023-03-05 ENCOUNTER — Encounter: Payer: Self-pay | Admitting: Family Medicine

## 2023-03-09 IMAGING — MG DIGITAL SCREENING BILAT W/ CAD
4 series · 4 of 4 positions shown · non-contrast
Comparison: Previous exam(s).

CLINICAL DATA: Screening.

EXAM:
DIGITAL SCREENING BILATERAL MAMMOGRAM WITH CAD
TECHNIQUE: Bilateral screening digital craniocaudal and mediolateral oblique
mammograms were obtained. The images were evaluated with
computer-aided detection.

[R CC]
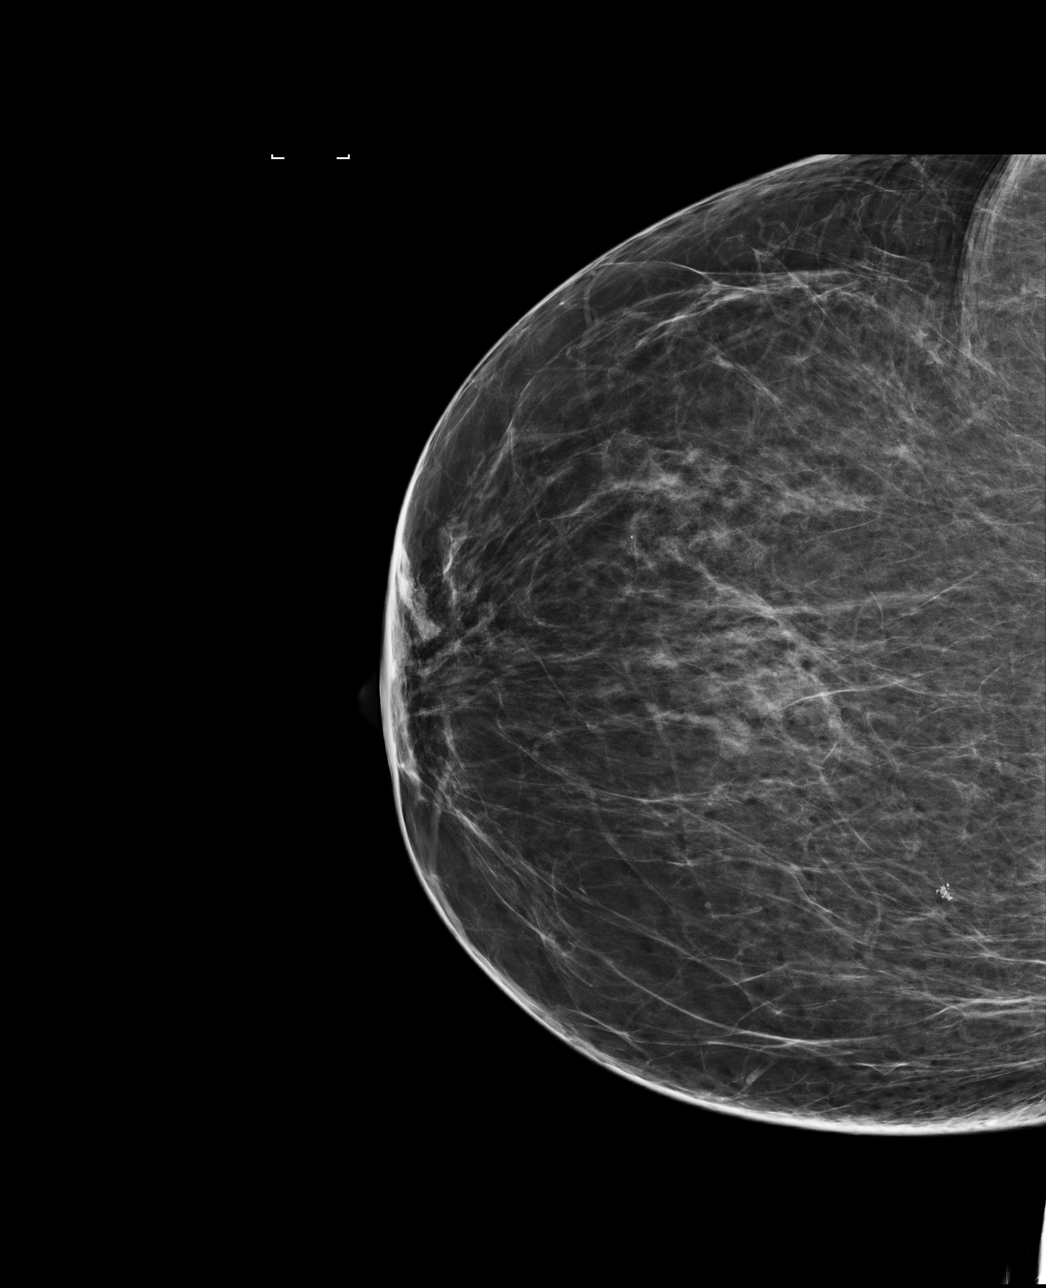

[L MLO]
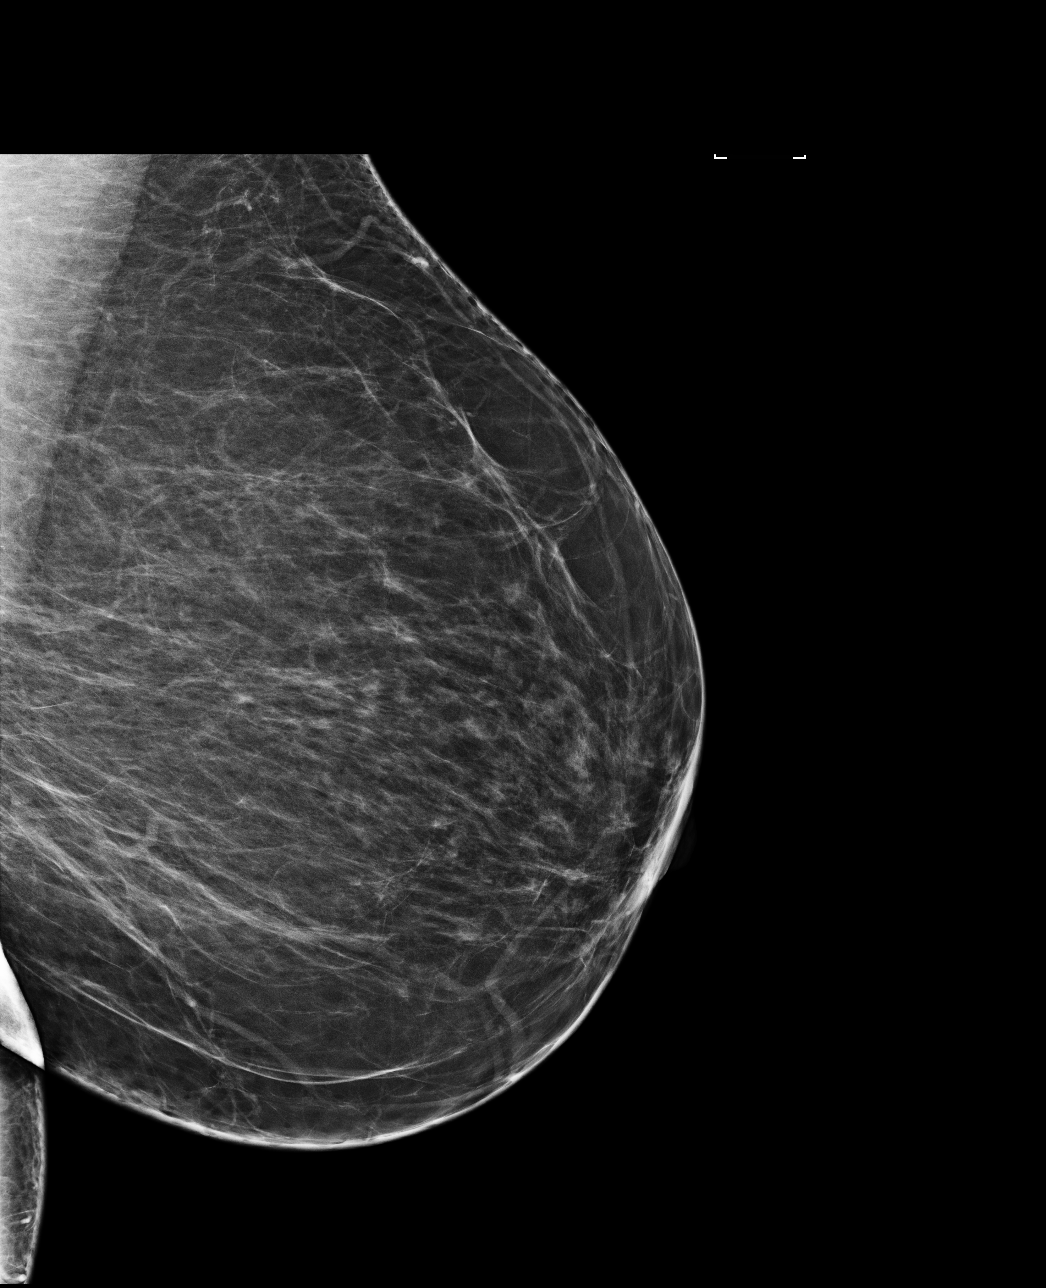

[L CC]
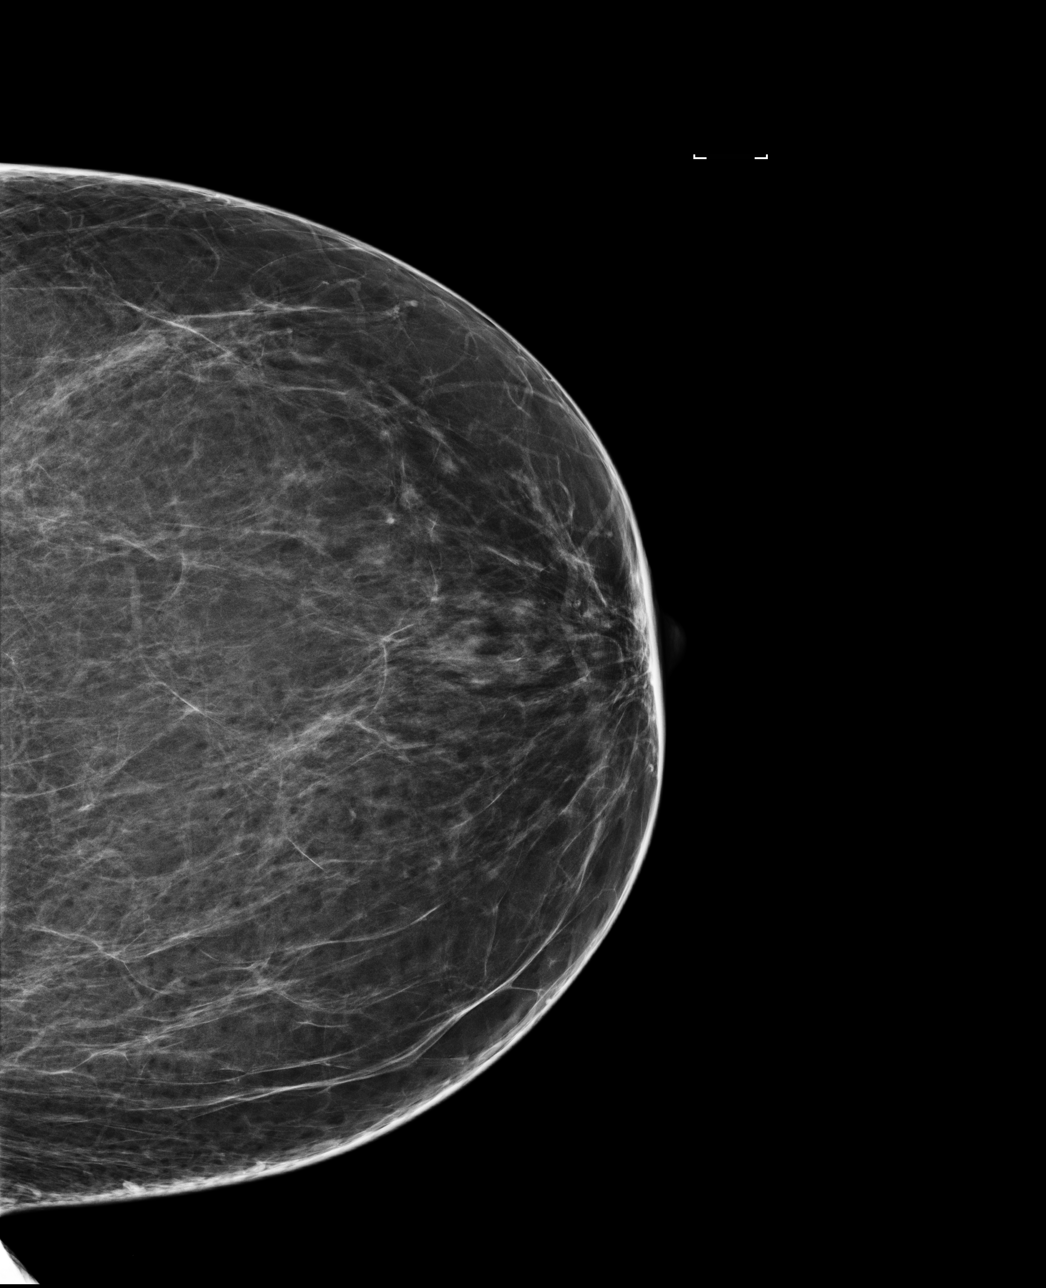

[R MLO]
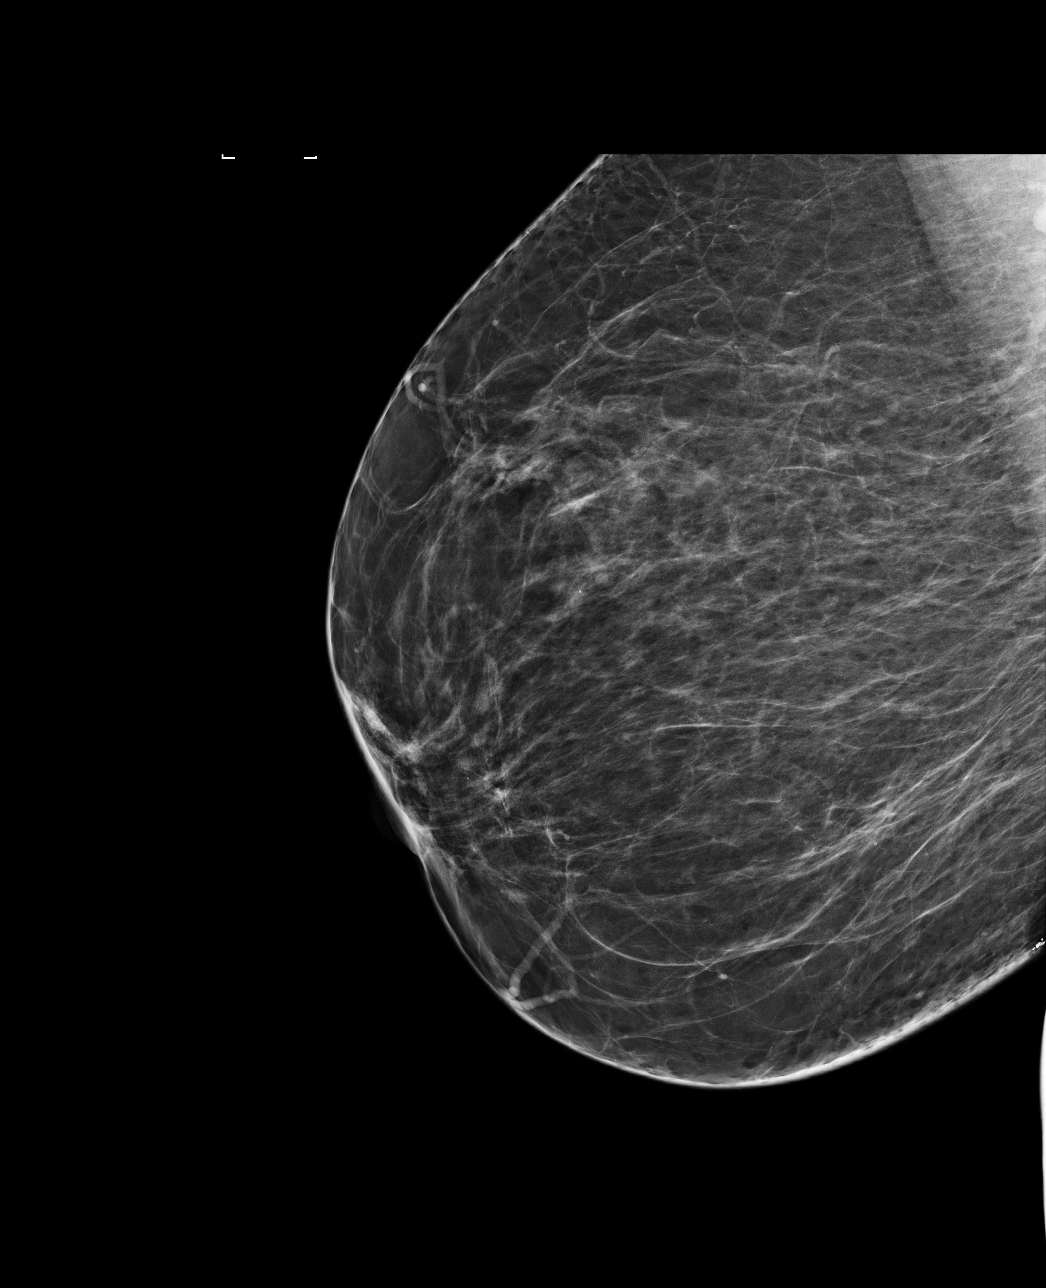

[4 of 4 positions shown; findings below may reference images not displayed]

ACR Breast Density Category b: There are scattered areas of
fibroglandular density.
FINDINGS: There are no findings suspicious for malignancy.
IMPRESSION: No mammographic evidence of malignancy. A result letter of this
screening mammogram will be mailed directly to the patient.

RECOMMENDATION:
Screening mammogram in one year. (Code:[id number]-V-ZRK)

BI-RADS CATEGORY  1: Negative.

## 2023-03-27 NOTE — Progress Notes (Unsigned)
 New Patient Note  RE: [name] MRN: [id number] DOB: 1955/12/18 Date of Office Visit: 03/28/2023  Consult requested by: Moshe Cipro, FNP Primary care provider: Etta Grandchild, MD  Chief Complaint: Pruritus and Rash  History of Present Illness: I had the pleasure of seeing [name] for initial evaluation at the [facility] on 03/28/2023. She is a 68 y.o. female, who is referred here by Etta Grandchild, MD for the evaluation of rash.  Discussed the use of AI scribe software for clinical note transcription with the patient, who gave verbal consent to proceed.    She has been experiencing persistent itching for two months, initially accompanied by a rash that affected her chest, sides, and under her breasts, as well as itching on her legs. The rash lasted for about a month and has since resolved, leaving some skin discoloration but no bruising. Despite the resolution of the rash, she continues to experience daily itching, which worsens with heat.  She was initially seen via a virtual visit and was prescribed triamcinolone cream, which helped dry up the rash. Additionally, she was given a course of prednisone, which she believes helped, although the itching persists. She has tried changing her laundry detergent and soap to unscented versions without improvement.  No changes in diet, recent illnesses, or new exposures that could explain her symptoms. No fever, chills, changes in appetite, or bowel habits. No history of similar symptoms and no recent changes in her living situation or pet exposure. She lives with her brother, who is not experiencing similar symptoms.  Her past medical history includes a hip replacement in 2015, and she takes medication for cholesterol, high blood pressure, and aspirin. She has no known allergies to medications or foods and has not experienced issues with bee stings, eczema, hives, asthma, or seasonal allergies.     Rash/itching  started about 2 months ago. Mainly occurs on her torso, legs. Describes them as itchy. Associated symptoms include: none.  Frequency of episodes: daily pruritus. Suspected triggers are unknown. Denies any fevers, chills, changes in medications, foods, personal care products or recent infections. She has tried the following therapies: triamcinolone with some benefit. Systemic steroids: yes. Currently on no daily meds for this.  Previous work up includes: 2025 cat, dog was negative, crp, cmp, cbc diff. Sed rate slightly elevated.  Previous history of rash/hives/pruritus: no. Patient is up to date with the following cancer screening tests: physical exam, mammogram, colonoscopy.  Currently using fragrance free/dye free products, dove unscented products.   02/28/2023 PCP visit: "Patient is in today for evaluation of rash to chest and torso.  Rash has been present for the last month. Has tried a prednisone taper as well as triamcinolone cream that was given by Malva Cogan, PA with a televisit. Reports that symptoms Denies new exposures, new medications, new animals, new foods. Has never seen an allergist in the past. Denies any shortness of breath, coughing, wheezing, oral swelling or itching, other symptoms."  2025 labs negative to cat, dog, food panel.   Assessment and Plan: Nazaria is a 68 y.o. female with:    Chronic pruritus with resolved rash Chronic pruritus persists despite resolved rash x 2 months. Etiology unclear; negative allergy tests to cat/dog and no recent changes in diet, environment, or medications. Improved with systemic steroids. ESR was slightly elevated, CBC, cmp, crp normal. Etiology unclear.  Keep track of rashes and take pictures. Write down what you had done during flares.  See below for proper skin care. Use fragrance free and dye free products. No dryer sheets or fabric softener.   Start zyrtec (cetirizine) 10mg  twice a day. If symptoms are not controlled or causes  drowsiness let us know. Start Pepcid (famotidine) 20mg  twice a day.  Avoid the following potential triggers: alcohol, tight clothing, NSAIDs, hot showers and getting overheated. See below for proper skin care.  Get bloodwork.  Return in about 4 weeks (around 04/25/2023).  Meds ordered this encounter  Medications   cetirizine (ZYRTEC ALLERGY) 10 MG tablet    Sig: Take 1 tablet (10 mg total) by mouth 2 (two) times daily.    Dispense:  60 tablet    Refill:  2   famotidine (PEPCID) 20 MG tablet    Sig: Take 1 tablet (20 mg total) by mouth 2 (two) times daily.    Dispense:  60 tablet    Refill:  2   Lab Orders         Alpha-Gal Panel         ANA, IFA (with reflex)         Chronic Urticaria         C3 and C4         Sedimentation rate         Thyroid Cascade Profile         Tryptase         Protein electrophoresis, serum         Protein Electrophoresis, Urine Rflx.      Other allergy screening: Asthma: no Rhino conjunctivitis: no Food allergy: no Medication allergy: no Hymenoptera allergy: no Urticaria: no Eczema:no History of recurrent infections suggestive of immunodeficency: no  Diagnostics: None.   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   High cholesterol    Hyperlipidemia    Hypertension    Lung nodule 03/2006   LLL 4-81mm   Pre-diabetes    Past Surgical History: Past Surgical History:  Procedure Laterality Date   COLONOSCOPY     COLONOSCOPY  2014   OVARIAN BIOPSY     POLYPECTOMY     TOTAL HIP ARTHROPLASTY Right 09/26/2016   TOTAL HIP ARTHROPLASTY Right 09/26/2016   Procedure: TOTAL HIP ARTHROPLASTY ANTERIOR APPROACH;  Surgeon: Sheral Apley, MD;  Location: [facility] OR;  Service: Orthopedics;  Laterality: Right;   TUBAL LIGATION     Medication List:  Current Outpatient Medications  Medication Sig Dispense Refill   aspirin EC 325 MG tablet Take 1 tablet (325 mg total) by mouth daily. For 30 days post op for DVT Prophylaxis 30 tablet 0   atorvastatin (LIPITOR) 40 MG tablet Take 1 tablet (40 mg total) by mouth daily. 90 tablet 1   cetirizine (ZYRTEC ALLERGY) 10 MG tablet Take 1 tablet (10 mg total) by mouth 2 (two) times daily. 60 tablet 2   famotidine (PEPCID) 20 MG tablet Take 1 tablet (20 mg total) by mouth 2 (two) times daily. 60 tablet 2   Multiple Vitamins-Minerals (MULTIVITAMIN WITH MINERALS) tablet Take 1 tablet by mouth daily.     olmesartan-hydrochlorothiazide (BENICAR HCT) 20-12.5 MG tablet Take 1 tablet by mouth daily. 90 tablet 1   triamcinolone cream (KENALOG) 0.1 % Apply 1 Application topically 2 (two) times daily. 30 g 0   No current facility-administered medications for this visit.   Allergies: Allergies  Allergen Reactions   Amlodipine Other (See Comments)    Constipation    Social History: Social History   Socioeconomic History   Marital  status: Divorced    Spouse name: Not on file   Number of children: Not on file   Years of education: Not on file   Highest education level: Not on file  Occupational History   Not on file  Tobacco Use   Smoking status: Never    Passive exposure: Never   Smokeless tobacco: Never  Vaping Use   Vaping status: Never Used  Substance and Sexual Activity   Alcohol use: Yes    Comment: Occas   Drug use: No   Sexual activity: Yes    Partners: Male    Birth control/protection: Surgical, Post-menopausal    Comment: 1st intercourse 71 yo-5 partners  Other Topics Concern   Not on file  Social History Narrative   Divorced- 3 grown children   Laid off from work in July of 2009   Social Drivers of Corporate investment banker Strain: Not on file  Food Insecurity: Not on file  Transportation Needs: Not on file  Physical Activity: Not on file  Stress: Not on file  Social Connections: Not on file   Lives in a house. Smoking: denies Occupation: Child psychotherapist HistorySurveyor, minerals in the house: no Engineer, civil (consulting) in the family room: yes Carpet in the bedroom: yes Heating: gas Cooling: central Pet: yes 1 dog x 10 yrs  Family History: Family History  Problem Relation Age of Onset   Dementia Mother    Asthma Sister    Esophageal cancer Sister    Asthma Brother    Diabetes Brother    Kidney disease Brother    Heart disease Brother    Cancer Other        pancreatic cancer   Eczema Daughter    Colon cancer Neg Hx    Rectal cancer Neg Hx    Stomach cancer Neg Hx    Allergic rhinitis Neg Hx    Angioedema Neg Hx    Urticaria Neg Hx    Review of Systems  Constitutional:  Negative for appetite change, chills, fever and unexpected weight change.  HENT:  Negative for congestion and rhinorrhea.   Eyes:  Negative for itching.  Respiratory:  Negative for cough, chest tightness, shortness of breath and wheezing.   Cardiovascular:  Negative for chest pain.   Gastrointestinal:  Negative for abdominal pain.  Genitourinary:  Negative for difficulty urinating.  Skin:  Positive for rash.  Neurological:  Negative for headaches.    Objective: BP 131/80 (BP Location: Right Arm, Patient Position: Sitting, Cuff Size: Normal)   Pulse 72   Temp 98.3 F (36.8 C) (Temporal)   Resp 16   Ht 5' 5.35" (1.66 m)   Wt 220 lb 4.8 oz (99.9 kg)   LMP 07/18/2012   SpO2 98%   BMI 36.26 kg/m  Body mass index is 36.26 kg/m. Physical Exam Vitals and nursing note reviewed.  Constitutional:      Appearance: Normal appearance. She is well-developed.  HENT:     Head: Normocephalic and atraumatic.     Right Ear: Tympanic membrane and external ear normal.     Left Ear: Tympanic membrane and external ear normal.     Nose: Nose normal.     Mouth/Throat:     Mouth: Mucous membranes are moist.     Pharynx: Oropharynx is clear.  Eyes:     Conjunctiva/sclera: Conjunctivae normal.  Cardiovascular:     Rate and Rhythm: Normal rate and regular rhythm.     Heart sounds: Normal heart sounds. No murmur heard.    No friction rub. No gallop.  Pulmonary:     Effort: Pulmonary effort is normal.     Breath sounds: Normal breath sounds. No wheezing, rhonchi or rales.  Musculoskeletal:     Cervical back: Neck supple.  Skin:    General: Skin is warm.     Findings: No rash.  Neurological:     Mental Status: She is alert and oriented to person, place, and time.  Psychiatric:        Behavior: Behavior normal.    The plan was reviewed with the patient/family, and all questions/concerned were addressed.  It was my pleasure to see Shajuana today and participate in her care. Please feel free to contact me with any questions or concerns.  Sincerely,  Wyline Mood, DO Allergy & Immunology  [facility] office: [phone] [facility] office: [phone]

## 2023-03-28 ENCOUNTER — Other Ambulatory Visit: Payer: Self-pay

## 2023-03-28 ENCOUNTER — Ambulatory Visit: Admitting: Allergy

## 2023-03-28 ENCOUNTER — Encounter: Payer: Self-pay | Admitting: Allergy

## 2023-03-28 VITALS — BP 131/80 | HR 72 | Temp 98.3°F | Resp 16 | Ht 65.35 in | Wt 220.3 lb

## 2023-03-28 DIAGNOSIS — R21 Rash and other nonspecific skin eruption: Secondary | ICD-10-CM

## 2023-03-28 DIAGNOSIS — L299 Pruritus, unspecified: Secondary | ICD-10-CM | POA: Diagnosis not present

## 2023-03-28 MED ORDER — CETIRIZINE HCL 10 MG PO TABS: 10.0000 mg | ORAL_TABLET | Freq: Two times a day (BID) | ORAL | 2 refills | Status: DC

## 2023-03-28 MED ORDER — FAMOTIDINE 20 MG PO TABS: 20.0000 mg | ORAL_TABLET | Freq: Two times a day (BID) | ORAL | 2 refills | Status: AC

## 2023-03-28 NOTE — Patient Instructions (Addendum)
 Skin I'm not sure what caused your rash/itching. Keep track of rashes and take pictures. Write down what you had done during flares.  See below for proper skin care. Use fragrance free and dye free products. No dryer sheets or fabric softener.    Start zyrtec (cetirizine) 10mg  twice a day. If symptoms are not controlled or causes drowsiness let us know. Start Pepcid (famotidine) 20mg  twice a day.  Avoid the following potential triggers: alcohol, tight clothing, NSAIDs, hot showers and getting overheated. See below for proper skin care.  Get bloodwork. We are ordering labs, so please allow 1-2 weeks for the results to come back. With the newly implemented Cures Act, the labs might be visible to you at the same time that they become visible to me. However, I will not address the results until all of the results are back, so please be patient.   Return in about 4 weeks (around 04/25/2023). Or sooner if needed.  Skin care recommendations  Bath time: Always use lukewarm water. AVOID very hot or cold water. Keep bathing time to 5-10 minutes. Do NOT use bubble bath. Use a mild soap and use just enough to wash the dirty areas. Do NOT scrub skin vigorously.  After bathing, pat dry your skin with a towel. Do NOT rub or scrub the skin.  Moisturizers and prescriptions:  ALWAYS apply moisturizers immediately after bathing (within 3 minutes). This helps to lock-in moisture. Use the moisturizer several times a day over the whole body. Good summer moisturizers include: Aveeno, CeraVe, Cetaphil. Good winter moisturizers include: Aquaphor, Vaseline, Cerave, Cetaphil, Eucerin, Vanicream. When using moisturizers along with medications, the moisturizer should be applied about one hour after applying the medication to prevent diluting effect of the medication or moisturize around where you applied the medications. When not using medications, the moisturizer can be continued twice daily as  maintenance.  Laundry and clothing: Avoid laundry products with added color or perfumes. Use unscented hypo-allergenic laundry products such as Tide free, Cheer free & gentle, and All free and clear.  If the skin still seems dry or sensitive, you can try double-rinsing the clothes. Avoid tight or scratchy clothing such as wool. Do not use fabric softeners or dyer sheets.

## 2023-04-02 LAB — PROTEIN ELECTROPHORESIS, URINE REFLEX

## 2023-04-06 LAB — PROTEIN ELECTROPHORESIS, SERUM
A/G Ratio: 1 (ref 0.7–1.7)
Albumin ELP: 3.7 g/dL (ref 2.9–4.4)
Alpha 1: 0.2 g/dL (ref 0.0–0.4)
Alpha 2: 1 g/dL (ref 0.4–1.0)
Beta: 1.3 g/dL (ref 0.7–1.3)
Gamma Globulin: 1.3 g/dL (ref 0.4–1.8)
Globulin, Total: 3.8 g/dL (ref 2.2–3.9)
Total Protein: 7.5 g/dL (ref 6.0–8.5)

## 2023-04-06 LAB — C3 AND C4
Complement C3, Serum: 198 mg/dL — ABNORMAL HIGH (ref 82–167)
Complement C4, Serum: 32 mg/dL (ref 12–38)

## 2023-04-06 LAB — PROTEIN ELECTROPHORESIS, URINE REFLEX
Albumin ELP, Urine: 29.7 %
Alpha-1-Globulin, U: 4 %
Alpha-2-Globulin, U: 13.8 %
Beta Globulin, U: 23 %
Gamma Globulin, U: 29.5 %
Protein, Ur: 13.5 mg/dL

## 2023-04-06 LAB — ANTINUCLEAR ANTIBODIES, IFA: ANA Titer 1: NEGATIVE

## 2023-04-06 LAB — ALPHA-GAL PANEL: IgE (Immunoglobulin E), Serum: 35 [IU]/mL (ref 6–495)

## 2023-04-06 LAB — THYROID CASCADE PROFILE: TSH: 1.64 u[IU]/mL (ref 0.450–4.500)

## 2023-04-06 LAB — CHRONIC URTICARIA: cu index: 3 (ref <10)

## 2023-04-06 LAB — SEDIMENTATION RATE: Sed Rate: 53 mm/h — ABNORMAL HIGH (ref 0–40)

## 2023-04-06 LAB — TRYPTASE: Tryptase: 7 ug/L (ref 2.2–13.2)

## 2023-04-09 ENCOUNTER — Encounter: Payer: Self-pay | Admitting: Allergy

## 2023-05-04 ENCOUNTER — Other Ambulatory Visit: Payer: Self-pay | Admitting: Internal Medicine

## 2023-05-04 DIAGNOSIS — I10 Essential (primary) hypertension: Secondary | ICD-10-CM

## 2023-05-04 DIAGNOSIS — E118 Type 2 diabetes mellitus with unspecified complications: Secondary | ICD-10-CM

## 2023-05-15 ENCOUNTER — Other Ambulatory Visit: Payer: Self-pay

## 2023-06-24 ENCOUNTER — Other Ambulatory Visit: Payer: Self-pay | Admitting: Allergy

## 2023-07-09 ENCOUNTER — Other Ambulatory Visit: Payer: Managed Care, Other (non HMO)

## 2023-07-29 ENCOUNTER — Other Ambulatory Visit: Payer: Self-pay | Admitting: Allergy

## 2023-09-26 LAB — HM DIABETES EYE EXAM

## 2023-09-30 ENCOUNTER — Other Ambulatory Visit: Payer: Self-pay | Admitting: Internal Medicine

## 2023-09-30 DIAGNOSIS — E785 Hyperlipidemia, unspecified: Secondary | ICD-10-CM

## 2023-11-15 ENCOUNTER — Encounter: Payer: Self-pay | Admitting: Internal Medicine

## 2023-11-15 NOTE — Progress Notes (Deleted)
    Subjective:    Patient ID: [name], female    DOB: 1955-05-08, 68 y.o.   MRN: [id number]      HPI [name] is here for No chief complaint on file.        Medications and allergies reviewed with patient and updated if appropriate.  Current Outpatient Medications on File Prior to Visit  Medication Sig Dispense Refill   olmesartan -hydrochlorothiazide (BENICAR  HCT) 20-12.5 MG tablet TAKE 1 TABLET BY MOUTH EVERY DAY 90 tablet 1   aspirin  EC 325 MG tablet Take 1 tablet (325 mg total) by mouth daily. For 30 days post op for DVT Prophylaxis 30 tablet 0   atorvastatin  (LIPITOR) 40 MG tablet TAKE 1 TABLET BY MOUTH EVERY DAY 90 tablet 1   cetirizine  (ZYRTEC ) 10 MG tablet TAKE 1 TABLET BY MOUTH TWICE A DAY 180 tablet 1   famotidine  (PEPCID ) 20 MG tablet Take 1 tablet (20 mg total) by mouth 2 (two) times daily. 60 tablet 2   Multiple Vitamins-Minerals (MULTIVITAMIN WITH MINERALS) tablet Take 1 tablet by mouth daily.     triamcinolone  cream (KENALOG ) 0.1 % Apply 1 Application topically 2 (two) times daily. 30 g 0   No current facility-administered medications on file prior to visit.    Review of Systems     Objective:  There were no vitals filed for this visit. BP Readings from Last 3 Encounters:  03/28/23 131/80  02/28/23 120/78  11/21/22 (!) 148/84   Wt Readings from Last 3 Encounters:  03/28/23 220 lb 4.8 oz (99.9 kg)  02/28/23 221 lb 6.4 oz (100.4 kg)  11/21/22 218 lb (98.9 kg)   There is no height or weight on file to calculate BMI.    Physical Exam         Assessment & Plan:    See Problem List for Assessment and Plan of chronic medical problems.

## 2023-11-16 ENCOUNTER — Ambulatory Visit: Admitting: Internal Medicine

## 2023-11-16 DIAGNOSIS — I10 Essential (primary) hypertension: Secondary | ICD-10-CM

## 2023-12-13 ENCOUNTER — Other Ambulatory Visit: Payer: Self-pay | Admitting: Internal Medicine

## 2023-12-13 DIAGNOSIS — E118 Type 2 diabetes mellitus with unspecified complications: Secondary | ICD-10-CM

## 2023-12-13 DIAGNOSIS — I10 Essential (primary) hypertension: Secondary | ICD-10-CM

## 2023-12-13 MED ORDER — OLMESARTAN MEDOXOMIL-HCTZ 20-12.5 MG PO TABS: 1.0000 | ORAL_TABLET | Freq: Every day | ORAL | 1 refills | Status: AC

## 2023-12-13 NOTE — Telephone Encounter (Signed)
 Copied from CRM #[id number]. Topic: Clinical - Medication Refill >> Dec 13, 2023  8:33 AM Antwanette L wrote: Medication: olmesartan -hydrochlorothiazide (BENICAR  HCT) 20-12.5 MG tablet  Has the patient contacted their pharmacy? Yes  This is the patient's preferred pharmacy:  [phone], [place] - [address] [place] [address] Phone: [phone] Fax: [phone]  Is this the correct pharmacy for this prescription? Yes   Has the prescription been filled recently? Yes. Last refill was on 05/15/23  Is the patient out of the medication? Yes  Has the patient been seen for an appointment in the last year OR does the patient have an upcoming appointment?Yes. The last ov with Dr. Joshua was on 11/16/22 and next appt is 01/22/24  Can we respond through MyChart? No. Contact the patient by phone at [phone]  Agent: Please be advised that Rx refills may take up to 3 business days. We ask that you follow-up with your pharmacy.

## 2024-01-14 ENCOUNTER — Ambulatory Visit: Payer: Self-pay | Admitting: Internal Medicine

## 2024-01-14 ENCOUNTER — Ambulatory Visit: Admitting: Internal Medicine

## 2024-01-14 ENCOUNTER — Encounter: Payer: Self-pay | Admitting: Internal Medicine

## 2024-01-14 VITALS — BP 142/78 | HR 68 | Temp 98.2°F | Resp 16 | Ht 65.35 in | Wt 217.0 lb

## 2024-01-14 DIAGNOSIS — B351 Tinea unguium: Secondary | ICD-10-CM | POA: Diagnosis not present

## 2024-01-14 DIAGNOSIS — G4452 New daily persistent headache (NDPH): Secondary | ICD-10-CM | POA: Insufficient documentation

## 2024-01-14 DIAGNOSIS — Z0000 Encounter for general adult medical examination without abnormal findings: Secondary | ICD-10-CM | POA: Diagnosis not present

## 2024-01-14 DIAGNOSIS — M65312 Trigger thumb, left thumb: Secondary | ICD-10-CM | POA: Diagnosis not present

## 2024-01-14 DIAGNOSIS — M79675 Pain in left toe(s): Secondary | ICD-10-CM

## 2024-01-14 DIAGNOSIS — I10 Essential (primary) hypertension: Secondary | ICD-10-CM

## 2024-01-14 DIAGNOSIS — E118 Type 2 diabetes mellitus with unspecified complications: Secondary | ICD-10-CM | POA: Diagnosis not present

## 2024-01-14 DIAGNOSIS — M79674 Pain in right toe(s): Secondary | ICD-10-CM

## 2024-01-14 DIAGNOSIS — E785 Hyperlipidemia, unspecified: Secondary | ICD-10-CM | POA: Diagnosis not present

## 2024-01-14 DIAGNOSIS — L239 Allergic contact dermatitis, unspecified cause: Secondary | ICD-10-CM | POA: Insufficient documentation

## 2024-01-14 DIAGNOSIS — Z0001 Encounter for general adult medical examination with abnormal findings: Secondary | ICD-10-CM

## 2024-01-14 DIAGNOSIS — Z23 Encounter for immunization: Secondary | ICD-10-CM | POA: Insufficient documentation

## 2024-01-14 DIAGNOSIS — Z1231 Encounter for screening mammogram for malignant neoplasm of breast: Secondary | ICD-10-CM | POA: Insufficient documentation

## 2024-01-14 LAB — BASIC METABOLIC PANEL WITH GFR
BUN: 15 mg/dL (ref 6–23)
CO2: 26 meq/L (ref 19–32)
Calcium: 9.3 mg/dL (ref 8.4–10.5)
Chloride: 107 meq/L (ref 96–112)
Creatinine, Ser: 0.72 mg/dL (ref 0.40–1.20)
GFR: 86.91 mL/min
Glucose, Bld: 84 mg/dL (ref 70–99)
Potassium: 3.8 meq/L (ref 3.5–5.1)
Sodium: 140 meq/L (ref 135–145)

## 2024-01-14 LAB — CBC WITH DIFFERENTIAL/PLATELET
Basophils Absolute: 0 K/uL (ref 0.0–0.1)
Basophils Relative: 0.3 % (ref 0.0–3.0)
Eosinophils Absolute: 0.1 K/uL (ref 0.0–0.7)
Eosinophils Relative: 1.6 % (ref 0.0–5.0)
HCT: 36.6 % (ref 36.0–46.0)
Hemoglobin: 12 g/dL (ref 12.0–15.0)
Lymphocytes Relative: 39.8 % (ref 12.0–46.0)
Lymphs Abs: 2.5 K/uL (ref 0.7–4.0)
MCHC: 32.8 g/dL (ref 30.0–36.0)
MCV: 79.7 fl (ref 78.0–100.0)
Monocytes Absolute: 0.5 K/uL (ref 0.1–1.0)
Monocytes Relative: 7.4 % (ref 3.0–12.0)
Neutro Abs: 3.2 K/uL (ref 1.4–7.7)
Neutrophils Relative %: 50.9 % (ref 43.0–77.0)
Platelets: 179 K/uL (ref 150.0–400.0)
RBC: 4.59 Mil/uL (ref 3.87–5.11)
RDW: 16.1 % — ABNORMAL HIGH (ref 11.5–15.5)
WBC: 6.4 K/uL (ref 4.0–10.5)

## 2024-01-14 LAB — URINALYSIS, ROUTINE W REFLEX MICROSCOPIC
Bilirubin Urine: NEGATIVE
Hgb urine dipstick: NEGATIVE
Ketones, ur: NEGATIVE
Nitrite: NEGATIVE
RBC / HPF: NONE SEEN
Specific Gravity, Urine: >=1.03 — AB (ref 1.000–1.030)
Total Protein, Urine: NEGATIVE
Urine Glucose: NEGATIVE
Urobilinogen, UA: 0.2 (ref 0.0–1.0)
pH: 5.5 (ref 5.0–8.0)

## 2024-01-14 LAB — TSH: TSH: 1.68 u[IU]/mL (ref 0.35–5.50)

## 2024-01-14 LAB — HEPATIC FUNCTION PANEL
ALT: 13 U/L (ref 3–35)
AST: 17 U/L (ref 5–37)
Albumin: 4.3 g/dL (ref 3.5–5.2)
Alkaline Phosphatase: 60 U/L (ref 39–117)
Bilirubin, Direct: 0 mg/dL — ABNORMAL LOW (ref 0.1–0.3)
Total Bilirubin: 0.4 mg/dL (ref 0.2–1.2)
Total Protein: 8 g/dL (ref 6.0–8.3)

## 2024-01-14 LAB — LIPID PANEL
Cholesterol: 165 mg/dL (ref 28–200)
HDL: 49 mg/dL
LDL Cholesterol: 97 mg/dL (ref 10–99)
NonHDL: 116.46
Total CHOL/HDL Ratio: 3
Triglycerides: 99 mg/dL (ref 10.0–149.0)
VLDL: 19.8 mg/dL (ref 0.0–40.0)

## 2024-01-14 LAB — SEDIMENTATION RATE: Sed Rate: 37 mm/h — ABNORMAL HIGH (ref 0–30)

## 2024-01-14 LAB — MICROALBUMIN / CREATININE URINE RATIO
Creatinine,U: 276.6 mg/dL
Microalb Creat Ratio: 5.1 mg/g (ref 0.0–30.0)
Microalb, Ur: 1.4 mg/dL (ref 0.7–1.9)

## 2024-01-14 LAB — C-REACTIVE PROTEIN: CRP: <0.5 mg/dL — ABNORMAL LOW (ref 1.0–20.0)

## 2024-01-14 LAB — HEMOGLOBIN A1C: Hgb A1c MFr Bld: 6.6 % — ABNORMAL HIGH (ref 4.6–6.5)

## 2024-01-14 MED ORDER — TRIAMCINOLONE ACETONIDE 0.1 % EX CREA: 1.0000 | TOPICAL_CREAM | Freq: Two times a day (BID) | CUTANEOUS | 3 refills | Status: AC

## 2024-01-14 MED ORDER — COVID-19 MRNA VAC-TRIS(PFIZER) 30 MCG/0.3ML IM SUSY: 0.3000 mL | PREFILLED_SYRINGE | Freq: Once | INTRAMUSCULAR | 0 refills | Status: AC

## 2024-01-14 MED ORDER — SHINGRIX 50 MCG/0.5ML IM SUSR: 0.5000 mL | Freq: Once | INTRAMUSCULAR | 1 refills | Status: AC

## 2024-01-14 MED ORDER — ATORVASTATIN CALCIUM 40 MG PO TABS: 40.0000 mg | ORAL_TABLET | Freq: Every day | ORAL | 1 refills | Status: AC

## 2024-01-14 NOTE — Progress Notes (Signed)
 "     Subjective:  Patient ID: [name], female    DOB: 06-19-1955  Age: 69 y.o. MRN: [id number]  CC: Headache, Hypertension, Annual Exam, Hyperlipidemia, and Diabetes   HPI [name] for a CPX and f/up ---  Discussed the use of AI scribe software for clinical note transcription with the patient, who gave verbal consent to proceed.  History of Present Illness [name] is a 69 year old female who Reeves with headaches and memory concerns.  She has been experiencing headaches for the past one to two months, localized at the top of her head. These headaches sometimes wake her from sleep and are occasionally accompanied by nausea and crying. No prior history of similar headaches, numbness, weakness, tingling, slurred speech, or trouble walking or talking. There is no history of head injury. She has recently run out of her blood pressure medication.  She reports memory issues, struggling to recall names or recognize people she knows, although this does not affect her daily functioning or work. She notes a family history of memory issues, with her mother and siblings experiencing similar problems.  She has a history of swelling in her arms, which resolved spontaneously. No chest pain or shortness of breath. Her last eye exam in September was normal, and she has no history of diabetes.  She lives alone with her dog and remains active, walking daily.     Outpatient Medications Prior to Visit  Medication Sig Dispense Refill   aspirin  EC 325 MG tablet Take 1 tablet (325 mg total) by mouth daily. For 30 days post op for DVT Prophylaxis 30 tablet 0   Multiple Vitamins-Minerals (MULTIVITAMIN WITH MINERALS) tablet Take 1 tablet by mouth daily.     olmesartan -hydrochlorothiazide (BENICAR  HCT) 20-12.5 MG tablet Take 1 tablet by mouth daily. 90 tablet 1   atorvastatin  (LIPITOR) 40 MG tablet TAKE 1 TABLET BY MOUTH EVERY DAY 90 tablet 1   triamcinolone  cream (KENALOG ) 0.1 %  Apply 1 Application topically 2 (two) times daily. 30 g 0   cetirizine  (ZYRTEC ) 10 MG tablet TAKE 1 TABLET BY MOUTH TWICE A DAY (Patient not taking: Reported on 01/14/2024) 180 tablet 1   famotidine  (PEPCID ) 20 MG tablet Take 1 tablet (20 mg total) by mouth 2 (two) times daily. (Patient not taking: Reported on 01/14/2024) 60 tablet 2   No facility-administered medications prior to visit.    ROS Review of Systems  Constitutional:  Negative for appetite change, chills, diaphoresis, fatigue and fever.  HENT: Negative.    Eyes: Negative.   Respiratory: Negative.  Negative for apnea, cough, chest tightness, shortness of breath and wheezing.   Cardiovascular:  Negative for chest pain, palpitations and leg swelling.  Gastrointestinal:  Negative for abdominal pain, constipation, diarrhea, nausea and vomiting.  Genitourinary: Negative.  Negative for difficulty urinating.  Musculoskeletal: Negative.  Negative for arthralgias, joint swelling and myalgias.       Chronic pain - Left thumb - IP joint  Skin: Negative.   Neurological:  Positive for headaches. Negative for dizziness, seizures, weakness, light-headedness and numbness.  Hematological:  Negative for adenopathy. Does not bruise/bleed easily.  Psychiatric/Behavioral: Negative.      Objective:  BP (!) 142/78 (BP Location: Left Arm, Patient Position: Sitting, Cuff Size: Normal)   Pulse 68   Temp 98.2 F (36.8 C) (Oral)   Resp 16   Ht 5' 5.35 (1.66 m)   Wt 217 lb (98.4 kg)   LMP 07/18/2012  SpO2 96%   BMI 35.72 kg/m   BP Readings from Last 3 Encounters:  01/14/24 (!) 142/78  03/28/23 131/80  02/28/23 120/78    Wt Readings from Last 3 Encounters:  01/14/24 217 lb (98.4 kg)  03/28/23 220 lb 4.8 oz (99.9 kg)  02/28/23 221 lb 6.4 oz (100.4 kg)    Physical Exam Vitals reviewed.  Constitutional:      Appearance: She is well-developed.  HENT:     Nose: Nose normal.     Mouth/Throat:     Mouth: Mucous membranes are moist.   Eyes:     General: No scleral icterus.    Extraocular Movements: Extraocular movements intact.     Pupils: Pupils are equal, round, and reactive to light.  Cardiovascular:     Rate and Rhythm: Normal rate and regular rhythm.     Heart sounds: Normal heart sounds, S1 normal and S2 normal. No murmur heard.    No friction rub. No gallop.     Comments: EKG--- NSR, 67 bpm No LVH, Q waves, or ST/T wave changes  Pulmonary:     Effort: Pulmonary effort is normal.     Breath sounds: No stridor. No wheezing, rhonchi or rales.  Abdominal:     General: Abdomen is flat.     Palpations: There is no mass.     Tenderness: There is no abdominal tenderness. There is no guarding.     Hernia: No hernia is present.  Musculoskeletal:     Cervical back: Neck supple.     Right lower leg: No edema.     Left lower leg: No edema.  Lymphadenopathy:     Cervical: No cervical adenopathy.  Skin:    General: Skin is warm and dry.     Findings: No lesion or rash.  Neurological:     General: No focal deficit present.     Mental Status: She is alert. Mental status is at baseline.     Cranial Nerves: Cranial nerves 2-12 are intact.     Sensory: Sensation is intact.     Motor: Motor function is intact.     Coordination: Coordination is intact.     Gait: Gait is intact.     Deep Tendon Reflexes: Reflexes normal. Babinski sign absent on the right side.  Psychiatric:        Mood and Affect: Mood normal.        Behavior: Behavior normal.     Lab Results  Component Value Date   WBC 6.4 01/14/2024   HGB 12.0 01/14/2024   HCT 36.6 01/14/2024   PLT 179.0 01/14/2024   GLUCOSE 84 01/14/2024   CHOL 165 01/14/2024   TRIG 99.0 01/14/2024   HDL 49.00 01/14/2024   LDLDIRECT 167.9 11/09/2010   LDLCALC 97 01/14/2024   ALT 13 01/14/2024   AST 17 01/14/2024   NA 140 01/14/2024   K 3.8 01/14/2024   CL 107 01/14/2024   CREATININE 0.72 01/14/2024   BUN 15 01/14/2024   CO2 26 01/14/2024   TSH 1.68 01/14/2024    HGBA1C 6.6 (H) 01/14/2024   MICROALBUR 1.4 01/14/2024    MM DIAG BREAST TOMO UNI RIGHT Result Date: 01/11/2022 CLINICAL DATA:  Patient returns after screening study for evaluation of possible RIGHT breast asymmetry. EXAM: DIGITAL DIAGNOSTIC UNILATERAL RIGHT MAMMOGRAM WITH TOMOSYNTHESIS TECHNIQUE: Right digital diagnostic mammography and breast tomosynthesis was performed. COMPARISON:  Previous exam(s). ACR Breast Density Category b: There are scattered areas of fibroglandular density. FINDINGS: Additional 2-D and  3-D images are performed. These views show no persistent asymmetry in the LATERAL aspect of the RIGHT breast. No suspicious mass, distortion, or microcalcifications are identified to suggest presence of malignancy. IMPRESSION: No mammographic evidence for malignancy. RECOMMENDATION: Screening mammogram in one year.(Code:SM-B-01Y) I have discussed the findings and recommendations with the patient. If applicable, a reminder letter will be sent to the patient regarding the next appointment. BI-RADS CATEGORY  1: Negative. Electronically Signed   By: Almarie Daring M.D.   On: 01/11/2022 14:37   The 10-year ASCVD risk score (Arnett DK, et al., 2019) is: 23.3%   Values used to calculate the score:     Age: 52 years     Clinically relevant sex: Female     Is Non-Hispanic African American: Yes     Diabetic: Yes     Tobacco smoker: No     Systolic Blood Pressure: 142 mmHg     Is BP treated: Yes     HDL Cholesterol: 49 mg/dL     Total Cholesterol: 165 mg/dL   Assessment & Plan:   Hyperlipidemia with target LDL less than 130- LDL goal achieved. Doing well on the statin  -     Lipid panel; Future -     TSH; Future -     Hepatic function panel; Future -     Atorvastatin  Calcium ; Take 1 tablet (40 mg total) by mouth daily.  Dispense: 90 tablet; Refill: 1 -     CT CARDIAC SCORING ([facility] LOCATIONS ONLY); Future  Essential hypertension- BP is well controlled. -     TSH; Future -     CBC with  Differential/Platelet; Future -     Basic metabolic panel with GFR; Future -     Hepatic function panel; Future -     Urinalysis, Routine w reflex microscopic; Future  Type II diabetes mellitus with manifestations (HCC)- Blood sugar is well controlled. -     Basic metabolic panel with GFR; Future -     Hemoglobin A1c; Future -     Urinalysis, Routine w reflex microscopic; Future -     Microalbumin / creatinine urine ratio; Future -     COVID-19 mRNA Vac-TriS(Pfizer); Inject 0.3 mLs into the muscle once for 1 dose.  Dispense: 0.3 mL; Refill: 0 -     HM Diabetes Foot Exam -     CT CARDIAC SCORING ([facility] LOCATIONS ONLY); Future  Encounter for general adult medical examination with abnormal findings- Exam completed, labs reviewed, vaccines reviewed and updated, cancer screenings addressed, pt ed material was given.   Allergic contact dermatitis, unspecified trigger -     Triamcinolone  Acetonide; Apply 1 Application topically 2 (two) times daily.  Dispense: 30 g; Refill: 3  Screening mammogram for breast cancer -     Digital Screening Mammogram, Left and Right; Future  Need for prophylactic vaccination and inoculation against varicella -     Shingrix ; Inject 0.5 mLs into the muscle once for 1 dose.  Dispense: 0.5 mL; Refill: 1  New daily persistent headache -     Sedimentation rate; Future -     C-reactive protein; Future -     MR BRAIN WO CONTRAST; Future  Trigger thumb of left hand -     Ambulatory referral to Orthopedic Surgery  Pain due to onychomycosis of toenails of both feet -     Ambulatory referral to Podiatry     Follow-up: Return in about 6 months (around 07/13/2024).  Debby Molt, MD "

## 2024-01-14 NOTE — Patient Instructions (Signed)

## 2024-01-15 ENCOUNTER — Ambulatory Visit
Admission: RE | Admit: 2024-01-15 | Discharge: 2024-01-15 | Disposition: A | Source: Ambulatory Visit | Attending: Internal Medicine | Admitting: Internal Medicine

## 2024-01-15 DIAGNOSIS — Z1231 Encounter for screening mammogram for malignant neoplasm of breast: Secondary | ICD-10-CM

## 2024-01-15 NOTE — Addendum Note (Signed)
 Addended by: Zameria Vogl , Fischer Halley M on: 01/15/2024 02:25 PM   Modules accepted: Orders

## 2024-01-22 ENCOUNTER — Ambulatory Visit: Admitting: Internal Medicine

## 2024-01-30 ENCOUNTER — Ambulatory Visit: Admitting: Podiatry

## 2024-01-31 ENCOUNTER — Other Ambulatory Visit
